# Patient Record
Sex: Female | Born: 1955 | Race: White | Hispanic: No | Marital: Married | Smoking: Never smoker
Health system: Southern US, Community
[De-identification: ages and names within clinical notes are randomized; demographics above are authoritative.]

## PROBLEM LIST (undated history)

## (undated) DIAGNOSIS — I5031 Acute diastolic (congestive) heart failure: Secondary | ICD-10-CM

## (undated) DIAGNOSIS — I10 Essential (primary) hypertension: Secondary | ICD-10-CM

## (undated) DIAGNOSIS — I2510 Atherosclerotic heart disease of native coronary artery without angina pectoris: Secondary | ICD-10-CM

## (undated) DIAGNOSIS — E039 Hypothyroidism, unspecified: Secondary | ICD-10-CM

## (undated) DIAGNOSIS — I4891 Unspecified atrial fibrillation: Secondary | ICD-10-CM

## (undated) DIAGNOSIS — R079 Chest pain, unspecified: Secondary | ICD-10-CM

## (undated) HISTORY — PX: MEDIAL PARTIAL KNEE REPLACEMENT: SHX5965

## (undated) HISTORY — PX: CHOLECYSTECTOMY: SHX55

## (undated) HISTORY — DX: Acute diastolic (congestive) heart failure: I50.31

## (undated) HISTORY — DX: Unspecified atrial fibrillation: I48.91

## (undated) HISTORY — DX: Atherosclerotic heart disease of native coronary artery without angina pectoris: I25.10

## (undated) HISTORY — DX: Chest pain, unspecified: R07.9

## (undated) HISTORY — DX: Essential (primary) hypertension: I10

---

## 2002-10-31 ENCOUNTER — Observation Stay (HOSPITAL_COMMUNITY): Admission: RE | Admit: 2002-10-31 | Discharge: 2002-11-01 | Payer: Self-pay | Admitting: Orthopedic Surgery

## 2004-06-26 ENCOUNTER — Ambulatory Visit (HOSPITAL_COMMUNITY): Admission: RE | Admit: 2004-06-26 | Discharge: 2004-06-27 | Payer: Self-pay | Admitting: Orthopedic Surgery

## 2012-09-06 DIAGNOSIS — R259 Unspecified abnormal involuntary movements: Secondary | ICD-10-CM | POA: Diagnosis not present

## 2012-09-06 DIAGNOSIS — R5383 Other fatigue: Secondary | ICD-10-CM | POA: Diagnosis not present

## 2012-09-06 DIAGNOSIS — I10 Essential (primary) hypertension: Secondary | ICD-10-CM | POA: Diagnosis not present

## 2012-09-06 DIAGNOSIS — A499 Bacterial infection, unspecified: Secondary | ICD-10-CM | POA: Diagnosis not present

## 2012-09-06 DIAGNOSIS — E119 Type 2 diabetes mellitus without complications: Secondary | ICD-10-CM | POA: Diagnosis not present

## 2012-09-06 DIAGNOSIS — R5381 Other malaise: Secondary | ICD-10-CM | POA: Diagnosis not present

## 2012-09-06 DIAGNOSIS — R51 Headache: Secondary | ICD-10-CM | POA: Diagnosis not present

## 2012-09-06 DIAGNOSIS — N390 Urinary tract infection, site not specified: Secondary | ICD-10-CM | POA: Diagnosis not present

## 2012-09-06 DIAGNOSIS — R209 Unspecified disturbances of skin sensation: Secondary | ICD-10-CM | POA: Diagnosis not present

## 2012-09-06 DIAGNOSIS — Z79899 Other long term (current) drug therapy: Secondary | ICD-10-CM | POA: Diagnosis not present

## 2013-07-06 DIAGNOSIS — Z6841 Body Mass Index (BMI) 40.0 and over, adult: Secondary | ICD-10-CM | POA: Diagnosis not present

## 2013-07-06 DIAGNOSIS — L408 Other psoriasis: Secondary | ICD-10-CM | POA: Diagnosis not present

## 2013-07-06 DIAGNOSIS — B369 Superficial mycosis, unspecified: Secondary | ICD-10-CM | POA: Diagnosis not present

## 2013-08-14 DIAGNOSIS — L219 Seborrheic dermatitis, unspecified: Secondary | ICD-10-CM | POA: Diagnosis not present

## 2013-08-14 DIAGNOSIS — L538 Other specified erythematous conditions: Secondary | ICD-10-CM | POA: Diagnosis not present

## 2013-08-15 DIAGNOSIS — F411 Generalized anxiety disorder: Secondary | ICD-10-CM | POA: Diagnosis not present

## 2013-08-15 DIAGNOSIS — Z6841 Body Mass Index (BMI) 40.0 and over, adult: Secondary | ICD-10-CM | POA: Diagnosis not present

## 2013-08-15 DIAGNOSIS — Z79899 Other long term (current) drug therapy: Secondary | ICD-10-CM | POA: Diagnosis not present

## 2013-08-29 DIAGNOSIS — F411 Generalized anxiety disorder: Secondary | ICD-10-CM | POA: Diagnosis not present

## 2013-08-29 DIAGNOSIS — I10 Essential (primary) hypertension: Secondary | ICD-10-CM | POA: Diagnosis not present

## 2013-08-29 DIAGNOSIS — Z79899 Other long term (current) drug therapy: Secondary | ICD-10-CM | POA: Diagnosis not present

## 2013-09-19 DIAGNOSIS — D485 Neoplasm of uncertain behavior of skin: Secondary | ICD-10-CM | POA: Diagnosis not present

## 2013-09-19 DIAGNOSIS — L259 Unspecified contact dermatitis, unspecified cause: Secondary | ICD-10-CM | POA: Diagnosis not present

## 2013-09-19 DIAGNOSIS — L219 Seborrheic dermatitis, unspecified: Secondary | ICD-10-CM | POA: Diagnosis not present

## 2013-09-26 DIAGNOSIS — L408 Other psoriasis: Secondary | ICD-10-CM | POA: Diagnosis not present

## 2014-03-03 DIAGNOSIS — Z23 Encounter for immunization: Secondary | ICD-10-CM | POA: Diagnosis not present

## 2014-05-15 DIAGNOSIS — L304 Erythema intertrigo: Secondary | ICD-10-CM | POA: Diagnosis not present

## 2014-05-15 DIAGNOSIS — L820 Inflamed seborrheic keratosis: Secondary | ICD-10-CM | POA: Diagnosis not present

## 2014-05-15 DIAGNOSIS — L400 Psoriasis vulgaris: Secondary | ICD-10-CM | POA: Diagnosis not present

## 2014-06-19 DIAGNOSIS — L304 Erythema intertrigo: Secondary | ICD-10-CM | POA: Diagnosis not present

## 2015-01-29 DIAGNOSIS — L400 Psoriasis vulgaris: Secondary | ICD-10-CM | POA: Diagnosis not present

## 2015-04-19 DIAGNOSIS — E1165 Type 2 diabetes mellitus with hyperglycemia: Secondary | ICD-10-CM | POA: Diagnosis not present

## 2015-04-19 DIAGNOSIS — I10 Essential (primary) hypertension: Secondary | ICD-10-CM | POA: Diagnosis not present

## 2015-04-19 DIAGNOSIS — Z6838 Body mass index (BMI) 38.0-38.9, adult: Secondary | ICD-10-CM | POA: Diagnosis not present

## 2015-04-19 DIAGNOSIS — E785 Hyperlipidemia, unspecified: Secondary | ICD-10-CM | POA: Diagnosis not present

## 2015-04-19 DIAGNOSIS — Z1231 Encounter for screening mammogram for malignant neoplasm of breast: Secondary | ICD-10-CM | POA: Diagnosis not present

## 2015-04-19 DIAGNOSIS — E669 Obesity, unspecified: Secondary | ICD-10-CM | POA: Diagnosis not present

## 2015-04-19 DIAGNOSIS — Z0000 Encounter for general adult medical examination without abnormal findings: Secondary | ICD-10-CM | POA: Diagnosis not present

## 2015-04-19 DIAGNOSIS — Z1389 Encounter for screening for other disorder: Secondary | ICD-10-CM | POA: Diagnosis not present

## 2015-04-19 DIAGNOSIS — Z23 Encounter for immunization: Secondary | ICD-10-CM | POA: Diagnosis not present

## 2015-04-26 DIAGNOSIS — Z1212 Encounter for screening for malignant neoplasm of rectum: Secondary | ICD-10-CM | POA: Diagnosis not present

## 2015-05-02 DIAGNOSIS — Z1231 Encounter for screening mammogram for malignant neoplasm of breast: Secondary | ICD-10-CM | POA: Diagnosis not present

## 2015-05-17 DIAGNOSIS — Z6838 Body mass index (BMI) 38.0-38.9, adult: Secondary | ICD-10-CM | POA: Diagnosis not present

## 2015-05-17 DIAGNOSIS — I10 Essential (primary) hypertension: Secondary | ICD-10-CM | POA: Diagnosis not present

## 2015-05-17 DIAGNOSIS — E1165 Type 2 diabetes mellitus with hyperglycemia: Secondary | ICD-10-CM | POA: Diagnosis not present

## 2015-05-17 DIAGNOSIS — E785 Hyperlipidemia, unspecified: Secondary | ICD-10-CM | POA: Diagnosis not present

## 2015-06-13 DIAGNOSIS — Z6839 Body mass index (BMI) 39.0-39.9, adult: Secondary | ICD-10-CM | POA: Diagnosis not present

## 2015-06-13 DIAGNOSIS — E1165 Type 2 diabetes mellitus with hyperglycemia: Secondary | ICD-10-CM | POA: Diagnosis not present

## 2015-06-13 DIAGNOSIS — I10 Essential (primary) hypertension: Secondary | ICD-10-CM | POA: Diagnosis not present

## 2015-07-11 DIAGNOSIS — E785 Hyperlipidemia, unspecified: Secondary | ICD-10-CM | POA: Diagnosis not present

## 2015-07-11 DIAGNOSIS — I10 Essential (primary) hypertension: Secondary | ICD-10-CM | POA: Diagnosis not present

## 2015-07-11 DIAGNOSIS — Z6841 Body Mass Index (BMI) 40.0 and over, adult: Secondary | ICD-10-CM | POA: Diagnosis not present

## 2015-07-11 DIAGNOSIS — E1165 Type 2 diabetes mellitus with hyperglycemia: Secondary | ICD-10-CM | POA: Diagnosis not present

## 2015-07-29 DIAGNOSIS — D485 Neoplasm of uncertain behavior of skin: Secondary | ICD-10-CM | POA: Diagnosis not present

## 2015-07-29 DIAGNOSIS — L400 Psoriasis vulgaris: Secondary | ICD-10-CM | POA: Diagnosis not present

## 2015-09-13 DIAGNOSIS — R079 Chest pain, unspecified: Secondary | ICD-10-CM | POA: Diagnosis not present

## 2015-09-13 DIAGNOSIS — M79642 Pain in left hand: Secondary | ICD-10-CM | POA: Diagnosis not present

## 2015-09-13 DIAGNOSIS — M79645 Pain in left finger(s): Secondary | ICD-10-CM | POA: Diagnosis not present

## 2015-09-13 DIAGNOSIS — R0789 Other chest pain: Secondary | ICD-10-CM | POA: Diagnosis not present

## 2015-09-16 DIAGNOSIS — Z6841 Body Mass Index (BMI) 40.0 and over, adult: Secondary | ICD-10-CM | POA: Diagnosis not present

## 2015-09-16 DIAGNOSIS — S161XXA Strain of muscle, fascia and tendon at neck level, initial encounter: Secondary | ICD-10-CM | POA: Diagnosis not present

## 2015-09-16 DIAGNOSIS — M542 Cervicalgia: Secondary | ICD-10-CM | POA: Diagnosis not present

## 2015-09-16 DIAGNOSIS — R0789 Other chest pain: Secondary | ICD-10-CM | POA: Diagnosis not present

## 2015-09-16 DIAGNOSIS — M79645 Pain in left finger(s): Secondary | ICD-10-CM | POA: Diagnosis not present

## 2015-10-04 DIAGNOSIS — Z6841 Body Mass Index (BMI) 40.0 and over, adult: Secondary | ICD-10-CM | POA: Diagnosis not present

## 2015-10-04 DIAGNOSIS — N644 Mastodynia: Secondary | ICD-10-CM | POA: Diagnosis not present

## 2015-10-10 DIAGNOSIS — N6001 Solitary cyst of right breast: Secondary | ICD-10-CM | POA: Diagnosis not present

## 2015-10-10 DIAGNOSIS — N63 Unspecified lump in breast: Secondary | ICD-10-CM | POA: Diagnosis not present

## 2015-10-10 DIAGNOSIS — N644 Mastodynia: Secondary | ICD-10-CM | POA: Diagnosis not present

## 2015-10-10 DIAGNOSIS — N641 Fat necrosis of breast: Secondary | ICD-10-CM | POA: Diagnosis not present

## 2015-10-15 DIAGNOSIS — E785 Hyperlipidemia, unspecified: Secondary | ICD-10-CM | POA: Diagnosis not present

## 2015-10-15 DIAGNOSIS — I10 Essential (primary) hypertension: Secondary | ICD-10-CM | POA: Diagnosis not present

## 2015-10-15 DIAGNOSIS — E1165 Type 2 diabetes mellitus with hyperglycemia: Secondary | ICD-10-CM | POA: Diagnosis not present

## 2015-11-12 DIAGNOSIS — R600 Localized edema: Secondary | ICD-10-CM | POA: Diagnosis not present

## 2015-11-22 DIAGNOSIS — L4050 Arthropathic psoriasis, unspecified: Secondary | ICD-10-CM | POA: Diagnosis not present

## 2015-11-22 DIAGNOSIS — L409 Psoriasis, unspecified: Secondary | ICD-10-CM | POA: Diagnosis not present

## 2015-11-22 DIAGNOSIS — L400 Psoriasis vulgaris: Secondary | ICD-10-CM | POA: Diagnosis not present

## 2015-11-27 DIAGNOSIS — R0600 Dyspnea, unspecified: Secondary | ICD-10-CM | POA: Diagnosis not present

## 2015-11-27 DIAGNOSIS — R0609 Other forms of dyspnea: Secondary | ICD-10-CM | POA: Diagnosis not present

## 2015-11-27 DIAGNOSIS — R600 Localized edema: Secondary | ICD-10-CM | POA: Diagnosis not present

## 2015-11-27 DIAGNOSIS — Z6841 Body Mass Index (BMI) 40.0 and over, adult: Secondary | ICD-10-CM | POA: Diagnosis not present

## 2015-12-18 DIAGNOSIS — R0609 Other forms of dyspnea: Secondary | ICD-10-CM | POA: Diagnosis not present

## 2015-12-18 DIAGNOSIS — I5042 Chronic combined systolic (congestive) and diastolic (congestive) heart failure: Secondary | ICD-10-CM | POA: Diagnosis not present

## 2015-12-18 DIAGNOSIS — E119 Type 2 diabetes mellitus without complications: Secondary | ICD-10-CM | POA: Insufficient documentation

## 2015-12-18 DIAGNOSIS — R0600 Dyspnea, unspecified: Secondary | ICD-10-CM

## 2015-12-18 DIAGNOSIS — I350 Nonrheumatic aortic (valve) stenosis: Secondary | ICD-10-CM | POA: Diagnosis not present

## 2015-12-18 HISTORY — DX: Type 2 diabetes mellitus without complications: E11.9

## 2015-12-18 HISTORY — DX: Dyspnea, unspecified: R06.00

## 2016-01-02 DIAGNOSIS — Z79899 Other long term (current) drug therapy: Secondary | ICD-10-CM | POA: Diagnosis not present

## 2016-01-06 DIAGNOSIS — R0609 Other forms of dyspnea: Secondary | ICD-10-CM | POA: Diagnosis not present

## 2016-01-13 DIAGNOSIS — L400 Psoriasis vulgaris: Secondary | ICD-10-CM | POA: Diagnosis not present

## 2016-01-17 DIAGNOSIS — L400 Psoriasis vulgaris: Secondary | ICD-10-CM | POA: Diagnosis not present

## 2016-01-17 DIAGNOSIS — R0609 Other forms of dyspnea: Secondary | ICD-10-CM | POA: Diagnosis not present

## 2016-01-17 DIAGNOSIS — I350 Nonrheumatic aortic (valve) stenosis: Secondary | ICD-10-CM | POA: Diagnosis not present

## 2016-01-17 DIAGNOSIS — E119 Type 2 diabetes mellitus without complications: Secondary | ICD-10-CM | POA: Diagnosis not present

## 2016-01-17 DIAGNOSIS — I5042 Chronic combined systolic (congestive) and diastolic (congestive) heart failure: Secondary | ICD-10-CM | POA: Diagnosis not present

## 2016-01-22 DIAGNOSIS — I10 Essential (primary) hypertension: Secondary | ICD-10-CM | POA: Diagnosis not present

## 2016-01-22 DIAGNOSIS — E785 Hyperlipidemia, unspecified: Secondary | ICD-10-CM | POA: Diagnosis not present

## 2016-01-22 DIAGNOSIS — R600 Localized edema: Secondary | ICD-10-CM | POA: Diagnosis not present

## 2016-01-22 DIAGNOSIS — Z6841 Body Mass Index (BMI) 40.0 and over, adult: Secondary | ICD-10-CM | POA: Diagnosis not present

## 2016-01-22 DIAGNOSIS — E1165 Type 2 diabetes mellitus with hyperglycemia: Secondary | ICD-10-CM | POA: Diagnosis not present

## 2016-02-14 DIAGNOSIS — L400 Psoriasis vulgaris: Secondary | ICD-10-CM | POA: Diagnosis not present

## 2016-03-26 DIAGNOSIS — R531 Weakness: Secondary | ICD-10-CM | POA: Diagnosis not present

## 2016-03-26 DIAGNOSIS — L400 Psoriasis vulgaris: Secondary | ICD-10-CM | POA: Diagnosis not present

## 2016-03-26 DIAGNOSIS — L4050 Arthropathic psoriasis, unspecified: Secondary | ICD-10-CM | POA: Diagnosis not present

## 2016-04-27 DIAGNOSIS — Z1389 Encounter for screening for other disorder: Secondary | ICD-10-CM | POA: Diagnosis not present

## 2016-04-27 DIAGNOSIS — I10 Essential (primary) hypertension: Secondary | ICD-10-CM | POA: Diagnosis not present

## 2016-04-27 DIAGNOSIS — Z1231 Encounter for screening mammogram for malignant neoplasm of breast: Secondary | ICD-10-CM | POA: Diagnosis not present

## 2016-04-27 DIAGNOSIS — I519 Heart disease, unspecified: Secondary | ICD-10-CM | POA: Diagnosis not present

## 2016-04-27 DIAGNOSIS — Z6841 Body Mass Index (BMI) 40.0 and over, adult: Secondary | ICD-10-CM | POA: Diagnosis not present

## 2016-04-27 DIAGNOSIS — M8589 Other specified disorders of bone density and structure, multiple sites: Secondary | ICD-10-CM | POA: Diagnosis not present

## 2016-04-27 DIAGNOSIS — Z23 Encounter for immunization: Secondary | ICD-10-CM | POA: Diagnosis not present

## 2016-04-27 DIAGNOSIS — E785 Hyperlipidemia, unspecified: Secondary | ICD-10-CM | POA: Diagnosis not present

## 2016-04-27 DIAGNOSIS — E1165 Type 2 diabetes mellitus with hyperglycemia: Secondary | ICD-10-CM | POA: Diagnosis not present

## 2016-04-27 DIAGNOSIS — R600 Localized edema: Secondary | ICD-10-CM | POA: Diagnosis not present

## 2016-05-01 DIAGNOSIS — L400 Psoriasis vulgaris: Secondary | ICD-10-CM | POA: Diagnosis not present

## 2016-05-01 DIAGNOSIS — R531 Weakness: Secondary | ICD-10-CM | POA: Diagnosis not present

## 2016-05-15 DIAGNOSIS — M85851 Other specified disorders of bone density and structure, right thigh: Secondary | ICD-10-CM | POA: Diagnosis not present

## 2016-05-15 DIAGNOSIS — Z1231 Encounter for screening mammogram for malignant neoplasm of breast: Secondary | ICD-10-CM | POA: Diagnosis not present

## 2016-05-15 DIAGNOSIS — M8589 Other specified disorders of bone density and structure, multiple sites: Secondary | ICD-10-CM | POA: Diagnosis not present

## 2016-05-28 DIAGNOSIS — L4050 Arthropathic psoriasis, unspecified: Secondary | ICD-10-CM | POA: Diagnosis not present

## 2016-05-28 DIAGNOSIS — L400 Psoriasis vulgaris: Secondary | ICD-10-CM | POA: Diagnosis not present

## 2016-06-11 DIAGNOSIS — R531 Weakness: Secondary | ICD-10-CM | POA: Diagnosis not present

## 2016-06-11 DIAGNOSIS — L400 Psoriasis vulgaris: Secondary | ICD-10-CM | POA: Diagnosis not present

## 2016-06-22 DIAGNOSIS — L400 Psoriasis vulgaris: Secondary | ICD-10-CM | POA: Diagnosis not present

## 2016-07-29 DIAGNOSIS — E1165 Type 2 diabetes mellitus with hyperglycemia: Secondary | ICD-10-CM | POA: Diagnosis not present

## 2016-07-29 DIAGNOSIS — E785 Hyperlipidemia, unspecified: Secondary | ICD-10-CM | POA: Diagnosis not present

## 2016-07-29 DIAGNOSIS — Z6841 Body Mass Index (BMI) 40.0 and over, adult: Secondary | ICD-10-CM | POA: Diagnosis not present

## 2016-07-29 DIAGNOSIS — I10 Essential (primary) hypertension: Secondary | ICD-10-CM | POA: Diagnosis not present

## 2016-07-29 DIAGNOSIS — R600 Localized edema: Secondary | ICD-10-CM | POA: Diagnosis not present

## 2016-09-21 DIAGNOSIS — L4050 Arthropathic psoriasis, unspecified: Secondary | ICD-10-CM | POA: Diagnosis not present

## 2016-09-21 DIAGNOSIS — L400 Psoriasis vulgaris: Secondary | ICD-10-CM | POA: Diagnosis not present

## 2016-11-02 DIAGNOSIS — E785 Hyperlipidemia, unspecified: Secondary | ICD-10-CM | POA: Diagnosis not present

## 2016-11-02 DIAGNOSIS — E1165 Type 2 diabetes mellitus with hyperglycemia: Secondary | ICD-10-CM | POA: Diagnosis not present

## 2016-11-02 DIAGNOSIS — R600 Localized edema: Secondary | ICD-10-CM | POA: Diagnosis not present

## 2016-11-02 DIAGNOSIS — I10 Essential (primary) hypertension: Secondary | ICD-10-CM | POA: Diagnosis not present

## 2016-11-02 DIAGNOSIS — Z6841 Body Mass Index (BMI) 40.0 and over, adult: Secondary | ICD-10-CM | POA: Diagnosis not present

## 2016-12-07 DIAGNOSIS — Z79899 Other long term (current) drug therapy: Secondary | ICD-10-CM | POA: Diagnosis not present

## 2016-12-07 DIAGNOSIS — I10 Essential (primary) hypertension: Secondary | ICD-10-CM | POA: Diagnosis not present

## 2016-12-07 DIAGNOSIS — E119 Type 2 diabetes mellitus without complications: Secondary | ICD-10-CM | POA: Diagnosis not present

## 2016-12-07 DIAGNOSIS — J810 Acute pulmonary edema: Secondary | ICD-10-CM | POA: Diagnosis not present

## 2016-12-07 DIAGNOSIS — I200 Unstable angina: Secondary | ICD-10-CM | POA: Diagnosis not present

## 2016-12-07 DIAGNOSIS — R0602 Shortness of breath: Secondary | ICD-10-CM | POA: Diagnosis not present

## 2016-12-07 DIAGNOSIS — R079 Chest pain, unspecified: Secondary | ICD-10-CM | POA: Diagnosis not present

## 2016-12-07 DIAGNOSIS — Z7984 Long term (current) use of oral hypoglycemic drugs: Secondary | ICD-10-CM | POA: Diagnosis not present

## 2016-12-08 DIAGNOSIS — E119 Type 2 diabetes mellitus without complications: Secondary | ICD-10-CM | POA: Diagnosis not present

## 2016-12-08 DIAGNOSIS — R079 Chest pain, unspecified: Secondary | ICD-10-CM | POA: Diagnosis not present

## 2016-12-08 DIAGNOSIS — I10 Essential (primary) hypertension: Secondary | ICD-10-CM | POA: Diagnosis not present

## 2016-12-09 DIAGNOSIS — Z6841 Body Mass Index (BMI) 40.0 and over, adult: Secondary | ICD-10-CM | POA: Diagnosis not present

## 2016-12-09 DIAGNOSIS — F419 Anxiety disorder, unspecified: Secondary | ICD-10-CM | POA: Diagnosis not present

## 2016-12-09 DIAGNOSIS — E1165 Type 2 diabetes mellitus with hyperglycemia: Secondary | ICD-10-CM | POA: Diagnosis not present

## 2016-12-09 DIAGNOSIS — E785 Hyperlipidemia, unspecified: Secondary | ICD-10-CM | POA: Diagnosis not present

## 2016-12-09 DIAGNOSIS — Z79899 Other long term (current) drug therapy: Secondary | ICD-10-CM | POA: Diagnosis not present

## 2016-12-09 DIAGNOSIS — R079 Chest pain, unspecified: Secondary | ICD-10-CM | POA: Diagnosis not present

## 2016-12-09 DIAGNOSIS — I10 Essential (primary) hypertension: Secondary | ICD-10-CM | POA: Diagnosis not present

## 2016-12-22 DIAGNOSIS — L400 Psoriasis vulgaris: Secondary | ICD-10-CM | POA: Diagnosis not present

## 2016-12-22 DIAGNOSIS — L4050 Arthropathic psoriasis, unspecified: Secondary | ICD-10-CM | POA: Diagnosis not present

## 2017-02-08 DIAGNOSIS — R0609 Other forms of dyspnea: Secondary | ICD-10-CM | POA: Diagnosis not present

## 2017-02-08 DIAGNOSIS — E7800 Pure hypercholesterolemia, unspecified: Secondary | ICD-10-CM | POA: Diagnosis not present

## 2017-02-08 DIAGNOSIS — I248 Other forms of acute ischemic heart disease: Secondary | ICD-10-CM | POA: Diagnosis present

## 2017-02-08 DIAGNOSIS — J9601 Acute respiratory failure with hypoxia: Secondary | ICD-10-CM | POA: Diagnosis present

## 2017-02-08 DIAGNOSIS — R0789 Other chest pain: Secondary | ICD-10-CM | POA: Diagnosis not present

## 2017-02-08 DIAGNOSIS — R000 Tachycardia, unspecified: Secondary | ICD-10-CM | POA: Diagnosis not present

## 2017-02-08 DIAGNOSIS — I10 Essential (primary) hypertension: Secondary | ICD-10-CM | POA: Diagnosis not present

## 2017-02-08 DIAGNOSIS — Z23 Encounter for immunization: Secondary | ICD-10-CM | POA: Diagnosis not present

## 2017-02-08 DIAGNOSIS — R079 Chest pain, unspecified: Secondary | ICD-10-CM | POA: Diagnosis not present

## 2017-02-08 DIAGNOSIS — I5031 Acute diastolic (congestive) heart failure: Secondary | ICD-10-CM | POA: Diagnosis present

## 2017-02-08 DIAGNOSIS — I480 Paroxysmal atrial fibrillation: Secondary | ICD-10-CM | POA: Diagnosis not present

## 2017-02-08 DIAGNOSIS — I110 Hypertensive heart disease with heart failure: Secondary | ICD-10-CM | POA: Diagnosis present

## 2017-02-08 DIAGNOSIS — R0602 Shortness of breath: Secondary | ICD-10-CM | POA: Diagnosis not present

## 2017-02-08 DIAGNOSIS — E119 Type 2 diabetes mellitus without complications: Secondary | ICD-10-CM | POA: Diagnosis not present

## 2017-02-08 DIAGNOSIS — Z7984 Long term (current) use of oral hypoglycemic drugs: Secondary | ICD-10-CM | POA: Diagnosis not present

## 2017-02-08 DIAGNOSIS — I4891 Unspecified atrial fibrillation: Secondary | ICD-10-CM | POA: Diagnosis not present

## 2017-02-08 DIAGNOSIS — Z6841 Body Mass Index (BMI) 40.0 and over, adult: Secondary | ICD-10-CM | POA: Diagnosis not present

## 2017-02-08 DIAGNOSIS — E785 Hyperlipidemia, unspecified: Secondary | ICD-10-CM | POA: Diagnosis not present

## 2017-02-08 DIAGNOSIS — M545 Low back pain: Secondary | ICD-10-CM | POA: Diagnosis not present

## 2017-02-08 DIAGNOSIS — Z79899 Other long term (current) drug therapy: Secondary | ICD-10-CM | POA: Diagnosis not present

## 2017-02-09 DIAGNOSIS — R079 Chest pain, unspecified: Secondary | ICD-10-CM

## 2017-02-09 DIAGNOSIS — R0602 Shortness of breath: Secondary | ICD-10-CM

## 2017-02-09 DIAGNOSIS — I10 Essential (primary) hypertension: Secondary | ICD-10-CM

## 2017-02-09 DIAGNOSIS — I4891 Unspecified atrial fibrillation: Secondary | ICD-10-CM

## 2017-02-10 DIAGNOSIS — I4891 Unspecified atrial fibrillation: Secondary | ICD-10-CM | POA: Diagnosis not present

## 2017-02-11 DIAGNOSIS — I4891 Unspecified atrial fibrillation: Secondary | ICD-10-CM

## 2017-02-17 DIAGNOSIS — Z6841 Body Mass Index (BMI) 40.0 and over, adult: Secondary | ICD-10-CM | POA: Diagnosis not present

## 2017-02-17 DIAGNOSIS — I480 Paroxysmal atrial fibrillation: Secondary | ICD-10-CM | POA: Diagnosis not present

## 2017-02-17 DIAGNOSIS — I5031 Acute diastolic (congestive) heart failure: Secondary | ICD-10-CM | POA: Diagnosis not present

## 2017-02-17 DIAGNOSIS — Z79899 Other long term (current) drug therapy: Secondary | ICD-10-CM | POA: Diagnosis not present

## 2017-02-25 ENCOUNTER — Ambulatory Visit (INDEPENDENT_AMBULATORY_CARE_PROVIDER_SITE_OTHER): Payer: Medicare Other | Admitting: Cardiology

## 2017-02-25 ENCOUNTER — Encounter: Payer: Self-pay | Admitting: Cardiology

## 2017-02-25 VITALS — BP 98/60 | HR 114 | Ht 62.0 in | Wt 213.1 lb

## 2017-02-25 DIAGNOSIS — E785 Hyperlipidemia, unspecified: Secondary | ICD-10-CM | POA: Diagnosis not present

## 2017-02-25 DIAGNOSIS — Z794 Long term (current) use of insulin: Secondary | ICD-10-CM | POA: Diagnosis not present

## 2017-02-25 DIAGNOSIS — E119 Type 2 diabetes mellitus without complications: Secondary | ICD-10-CM

## 2017-02-25 DIAGNOSIS — R002 Palpitations: Secondary | ICD-10-CM | POA: Diagnosis not present

## 2017-02-25 DIAGNOSIS — I480 Paroxysmal atrial fibrillation: Secondary | ICD-10-CM | POA: Insufficient documentation

## 2017-02-25 DIAGNOSIS — I10 Essential (primary) hypertension: Secondary | ICD-10-CM

## 2017-02-25 DIAGNOSIS — I7090 Unspecified atherosclerosis: Secondary | ICD-10-CM

## 2017-02-25 HISTORY — DX: Unspecified atherosclerosis: I70.90

## 2017-02-25 HISTORY — DX: Hyperlipidemia, unspecified: E78.5

## 2017-02-25 HISTORY — DX: Paroxysmal atrial fibrillation: I48.0

## 2017-02-25 HISTORY — DX: Morbid (severe) obesity due to excess calories: E66.01

## 2017-02-25 MED ORDER — AMIODARONE HCL 200 MG PO TABS: 400.0000 mg | ORAL_TABLET | Freq: Two times a day (BID) | ORAL | 3 refills | Status: DC

## 2017-02-25 NOTE — Patient Instructions (Addendum)
Medication Instructions:  Your physician has recommended you make the following change in your medication: START amiodarone 400mg  twice a day for one week and then take 400mg  daily Xarelto samples given  Labwork: None  Testing/Procedures: Your physician has recommended that you have a pulmonary function test. Pulmonary Function Tests are a group of tests that measure how well air moves in and out of your lungs.  This can be done with Dr. Alcide Clever  Follow-Up: 2-3 weeks  Any Other Special Instructions Will Be Listed Below (If Applicable).   Pulmonary Function Tests Pulmonary function tests (PFTs) are used to measure how well your lungs work, find out what is causing your lung problems, and figure out the best treatment for you. You may have PFTs:  When you have an illness involving the lungs.  To follow changes in your lung function over time if you have a chronic lung disease.  If you are an Nature conservation officer. This checks the effects of being exposed to chemicals over a long period of time.  To check lung function before having surgery or other procedures.  To check your lungs if you smoke.  To check if prescribed medicines or treatments are helping your lungs.  Your results will be compared to the expected lung function of someone with healthy lungs who is similar to you in:  Age.  Gender.  Height.  Weight.  Race or ethnicity.  This is done to show how your lungs compare to normal lung function (percent predicted). This is how your health care provider knows if your lung function is normal or not. If you have had PFTs done before, your health care provider will compare your current results with past results. This shows if your lung function is better, worse, or the same as before. Tell a health care provider about:  Any allergies you have.  All medicines you are taking, including inhaler or nebulizer medicines, vitamins, herbs, eye drops, creams, and  over-the-counter medicines.  Any blood disorders you have.  Any surgeries you have had, especially recent eye surgery, abdominal surgery, or chest surgery. These can make PFTs difficult or unsafe.  Any medical conditions you have, including chest pain or heart problems, tuberculosis, or respiratory infections such as pneumonia, a cold, or the flu.  Any fear of being in closed spaces (claustrophobia). Some of your tests may be in a closed space. What are the risks? Generally, this is a safe procedure. However, problems may occur, including:  Light-headedness due to over-breathing (hyperventilation).  An asthma attack from deep breathing.  A collapsed lung.  What happens before the procedure?  Take over-the-counter and prescription medicines only as told by your health care provider. If you take inhaler or nebulizer medicines, ask your health care provider which medicines you should take on the day of your testing. Some inhaler medicines may interfere with PFTs if they are taken shortly before the tests.  Follow your health care provider's instructions on eating and drinking restrictions. This may include avoiding eating large meals and drinking alcohol before the testing.  Do not use any products that contain nicotine or tobacco, such as cigarettes and e-cigarettes. If you need help quitting, ask your health care provider.  Wear comfortable clothing that will not interfere with breathing. What happens during the procedure?  You will be given a soft nose clip to wear. This is done so all of your breaths will go through your mouth instead of your nose.  You will be given a  germ-free (sterile) mouthpiece. It will be attached to a machine that measures your breathing (spirometer).  You will be asked to do various breathing maneuvers. The maneuvers will be done by breathing in (inhaling) and breathing out (exhaling). You may be asked to repeat the maneuvers several times before the testing  is done.  It is important to follow the instructions exactly to get accurate results. Make sure to blow as hard and as fast as you can when you are told to do so.  You may be given a medicine that makes the small air passages in your lungs larger (bronchodilator) after testing has been done. This medicine will make it easier for you to breathe.  The tests will be repeated after the bronchodilator has taken effect.  You will be monitored carefully during the procedure for faintness, dizziness, trouble breathing, or any other problems. The procedure may vary among health care providers and hospitals. What happens after the procedure?  It is up to you to get your test results. Ask your health care provider, or the department that is doing the tests, when your results will be ready. After you have received your test results, talk with your health care provider about treatment options, if necessary. Summary  Pulmonary function tests (PFTs) are used to measure how well your lungs work, find out what is causing your lung problems, and figure out the best treatment for you.  Wear comfortable clothing that will not interfere with breathing.  It is up to you to get your test results. After you have received them, talk with your health care provider about treatment options, if necessary. This information is not intended to replace advice given to you by your health care provider. Make sure you discuss any questions you have with your health care provider. Document Released: 12/26/2003 Document Revised: 03/26/2016 Document Reviewed: 03/26/2016 Elsevier Interactive Patient Education  2017 Else[employer] Amiodarone tablets What is this medicine? AMIODARONE (a MEE oh da rone) is an antiarrhythmic drug. It helps make your heart beat regularly. Because of the side effects caused by this medicine, it is only used when other medicines have not worked. It is usually used for heartbeat problems that may be life  threatening. This medicine may be used for other purposes; ask your health care provider or pharmacist if you have questions. COMMON BRAND NAME(S): Cordarone, Pacerone What should I tell my health care provider before I take this medicine? They need to know if you have any of these conditions: -liver disease -lung disease -other heart problems -thyroid disease -an unusual or allergic reaction to amiodarone, iodine, other medicines, foods, dyes, or preservatives -pregnant or trying to get pregnant -breast-feeding How should I use this medicine? Take this medicine by mouth with a glass of water. Follow the directions on the prescription label. You can take this medicine with or without food. However, you should always take it the same way each time. Take your doses at regular intervals. Do not take your medicine more often than directed. Do not stop taking except on the advice of your doctor or health care professional. A special MedGuide will be given to you by the pharmacist with each prescription and refill. Be sure to read this information carefully each time. Talk to your pediatrician regarding the use of this medicine in children. Special care may be needed. Overdosage: If you think you have taken too much of this medicine contact a poison control center or emergency room at once. NOTE: This medicine is  only for you. Do not share this medicine with others. What if I miss a dose? If you miss a dose, take it as soon as you can. If it is almost time for your next dose, take only that dose. Do not take double or extra doses. What may interact with this medicine? Do not take this medicine with any of the following medications: -abarelix -apomorphine -arsenic trioxide -certain antibiotics like erythromycin, gemifloxacin, levofloxacin, pentamidine -certain medicines for depression like amoxapine, tricyclic antidepressants -certain medicines for fungal infections like fluconazole,  itraconazole, ketoconazole, posaconazole, voriconazole -certain medicines for irregular heart beat like disopyramide, dofetilide, dronedarone, ibutilide, propafenone, sotalol -certain medicines for malaria like chloroquine, halofantrine -cisapride -droperidol -haloperidol -hawthorn -maprotiline -methadone -phenothiazines like chlorpromazine, mesoridazine, thioridazine -pimozide -ranolazine -red yeast rice -vardenafil -ziprasidone This medicine may also interact with the following medications: -antiviral medicines for HIV or AIDS -certain medicines for blood pressure, heart disease, irregular heart beat -certain medicines for cholesterol like atorvastatin, cerivastatin, lovastatin, simvastatin -certain medicines for hepatitis C like sofosbuvir and ledipasvir; sofosbuvir -certain medicines for seizures like phenytoin -certain medicines for thyroid problems -certain medicines that treat or prevent blood clots like warfarin -cholestyramine -cimetidine -clopidogrel -cyclosporine -dextromethorphan -diuretics -fentanyl -general anesthetics -grapefruit juice -lidocaine -loratadine -methotrexate -other medicines that prolong the QT interval (cause an abnormal heart rhythm) -procainamide -quinidine -rifabutin, rifampin, or rifapentine -St. John's Wort -trazodone This list may not describe all possible interactions. Give your health care provider a list of all the medicines, herbs, non-prescription drugs, or dietary supplements you use. Also tell them if you smoke, drink alcohol, or use illegal drugs. Some items may interact with your medicine. What should I watch for while using this medicine? Your condition will be monitored closely when you first begin therapy. Often, this drug is first started in a hospital or other monitored health care setting. Once you are on maintenance therapy, visit your doctor or health care professional for regular checks on your progress. Because your  condition and use of this medicine carry some risk, it is a good idea to carry an identification card, necklace or bracelet with details of your condition, medications, and doctor or health care professional. Dennis Bast may get drowsy or dizzy. Do not drive, use machinery, or do anything that needs mental alertness until you know how this medicine affects you. Do not stand or sit up quickly, especially if you are an older patient. This reduces the risk of dizzy or fainting spells. This medicine can make you more sensitive to the sun. Keep out of the sun. If you cannot avoid being in the sun, wear protective clothing and use sunscreen. Do not use sun lamps or tanning beds/booths. You should have regular eye exams before and during treatment. Call your doctor if you have blurred vision, see halos, or your eyes become sensitive to light. Your eyes may get dry. It may be helpful to use a lubricating eye solution or artificial tears solution. If you are going to have surgery or a procedure that requires contrast dyes, tell your doctor or health care professional that you are taking this medicine. What side effects may I notice from receiving this medicine? Side effects that you should report to your doctor or health care professional as soon as possible: -allergic reactions like skin rash, itching or hives, swelling of the face, lips, or tongue -blue-gray coloring of the skin -blurred vision, seeing blue green halos, increased sensitivity of the eyes to light -breathing problems -chest pain -dark urine -fast, irregular heartbeat -  feeling faint or light-headed -intolerance to heat or cold -nausea or vomiting -pain and swelling of the scrotum -pain, tingling, numbness in feet, hands -redness, blistering, peeling or loosening of the skin, including inside the mouth -spitting up blood -stomach pain -sweating -unusual or uncontrolled movements of body -unusually weak or tired -weight gain or loss -yellowing  of the eyes or skin Side effects that usually do not require medical attention (report to your doctor or health care professional if they continue or are bothersome): -change in sex drive or performance -constipation -dizziness -headache -loss of appetite -trouble sleeping This list may not describe all possible side effects. Call your doctor for medical advice about side effects. You may report side effects to FDA at 1-800-FDA-1088. Where should I keep my medicine? Keep out of the reach of children. Store at room temperature between 20 and 25 degrees C (68 and 77 degrees F). Protect from light. Keep container tightly closed. Throw away any unused medicine after the expiration date. NOTE: This sheet is a summary. It may not cover all possible information. If you have questions about this medicine, talk to your doctor, pharmacist, or health care provider.  2018 Elsevier/Gold Standard (2013-08-07 19:48:11)     If you need a refill on your cardiac medications before your next appointment, please call your pharmacy.

## 2017-02-25 NOTE — Addendum Note (Signed)
Addended by: Mattie Marlin on: 02/25/2017 10:52 AM   Modules accepted: Orders

## 2017-02-25 NOTE — Progress Notes (Signed)
Cardiology Office Note:    Date:  02/25/2017   ID:  NOCOLE ZAMMIT, DOB 03-13-1956, MRN [id number]  PCP:  Nicoletta Dress, MD  Cardiologist:  Jenean Lindau, MD   Referring MD: Nicoletta Dress, MD    ASSESSMENT:    1. Palpitation   2. Type 2 diabetes mellitus without complication, with long-term current use of insulin ([place])   3. Essential hypertension   4. Dyslipidemia   5. Morbid obesity ([place])   6. Paroxysmal atrial fibrillation ([place])    PLAN:    In order of problems listed above:  1. I discussed with the patient atrial fibrillation, disease process. Management and therapy including rate and rhythm control, anticoagulation benefits and potential risks were discussed extensively with the patient. Patient had multiple questions which were answered to patient's satisfaction. 2. Patient is currently in atrial fibrillation with rapid ventricular rate. She has undergone cardioversion. Her left atrial size is normal. She is on anticoagulation and takes it meticulously. I discussed with the rate and rhythm control issues with her and her daughter at extensive length. They're interested and rhythm control. Being a diabetic and with multiple risk factors for coronary artery disease and existing atherosclerotic vascular disease I would prefer that she either takes amiodarone or Dronedrone therapy. He and she cannot afford the latter.she already has issues with anticoagulation medication and the cost. I discussed amiodarone, benefits and potential this and she localized understanding. She will get eye check up by a local doctor and also we will refer her for pulmonary function testing. I noticed that her chest x-ray  was unremarkable and her liver function tests were fine. I will initiate her on any of 400 mg twice a day for a week and then 400 mg daily. She'll be seen in follow-up appointment in 2-3 week or earlier if she has any concerns. 3. Her blood pressure stable. Diet was discussed  with dyslipidemia and diabetes mellitus. Risks of obesity explained and she verbalized understanding. 4. She will be seen in follow-up appointment in 2-3 weeks or earlier if she has any concerns we also give her asamples for xarelto during this visit. 5. Total time for this evaluation was one hour.   Medication Adjustments/Labs and Tests Ordered: Current medicines are reviewed at length with the patient today.  Concerns regarding medicines are outlined above.  Orders Placed This Encounter  Procedures  . EKG 12-Lead   No orders of the defined types were placed in this encounter.    History of Present Illness:    Crystal Ramos is a 61 y.o. female who is being seen today for the evaluation of patient with paroxysmal atrial fibrillation at the request of Nicoletta Dress, MD. Patient is a pleasant 61 year old female. She has past medical history of essential hypertension, dyslipidemia and diabetes mellitus. Based on her reports from [place] hospital she also has atherosclerotic vascular disease. She was in the hospital for atrial fibrillation with rapid ventricular rate. She underwent a TEE guided cardioversion and was in sinus rhythm. Subsequently she was sent here for follow-up. She is now in atrial fibrillation with fairly controlled ventricular rate and gives history of shortness of breath on exertion. She underwent stress testing and also at [facility] several weeks ago and this test did not reveal any evidence of ischemia. At the time of my evaluation she is alert awake oriented and in no distress, and her daughter accompanies her.  Past Medical History:  Diagnosis Date  . Acute  diastolic heart failure ([place])   . Atrial fibrillation ([place])   . Chest pain   . Essential hypertension     Past Surgical History:  Procedure Laterality Date  . CHOLECYSTECTOMY      Current Medications: Current Meds  Medication Sig  . atorvastatin (LIPITOR) 40 MG tablet Take 40 mg by mouth daily.   . furosemide (LASIX) 20 MG tablet Take 20 mg by mouth daily.  Marland Kitchen glimepiride (AMARYL) 4 MG tablet Take 4 mg by mouth.  . linagliptin (TRADJENTA) 5 MG TABS tablet Take 5 mg by mouth.  Marland Kitchen lisinopril (PRINIVIL,ZESTRIL) 40 MG tablet Take 40 mg by mouth daily.  . metFORMIN (GLUCOPHAGE) 850 MG tablet Take 850 mg by mouth 3 (three) times daily.  . metoprolol succinate (TOPROL-XL) 50 MG 24 hr tablet Take 50 mg by mouth daily. Take with or immediately following a meal.  . potassium chloride SA (K-DUR,KLOR-CON) 20 MEQ tablet Take by mouth.  . rivaroxaban (XARELTO) 20 MG TABS tablet Take 20 mg by mouth daily with supper.     Allergies:   Patient has no known allergies.   Social History   Social History  . Marital status: Married    Spouse name: N/A  . Number of children: N/A  . Years of education: N/A   Social History Main Topics  . Smoking status: Never Smoker  . Smokeless tobacco: Never Used  . Alcohol use No  . Drug use: No  . Sexual activity: Not Asked   Other Topics Concern  . None   Social History Narrative  . None     Family History: The patient's family history includes Diabetes in her mother; Stroke in her father and paternal aunt.  ROS:   Please see the history of present illness.    All other systems reviewed and are negative.  EKGs/Labs/Other Studies Reviewed:    The following studies were reviewed today: I reviewed hospital records extensively including findings of the chest x-ray, echocardiogram and TEE findings.   Recent Labs: No results found for requested labs within last 8760 hours.  Recent Lipid Panel No results found for: CHOL, TRIG, HDL, CHOLHDL, VLDL, LDLCALC, LDLDIRECT  Physical Exam:    VS:  BP 98/60   Pulse (!) 114   Ht 5\' 2"  (1.575 m)   Wt 213 lb 1.9 oz (96.7 kg)   SpO2 98%   BMI 38.98 kg/m     Wt Readings from Last 3 Encounters:  02/25/17 213 lb 1.9 oz (96.7 kg)     GEN: Patient is in no acute distress HEENT: Normal NECK: No JVD; No  carotid bruits LYMPHATICS: No lymphadenopathy CARDIAC: S1 S2 regular, 2/6 systolic murmur at the apex. RESPIRATORY:  Clear to auscultation without rales, wheezing or rhonchi  ABDOMEN: Soft, non-tender, non-distended MUSCULOSKELETAL:  No edema; No deformity  SKIN: Warm and dry NEUROLOGIC:  Alert and oriented x 3 PSYCHIATRIC:  Normal affect    Signed, Jenean Lindau, MD  02/25/2017 10:34 AM    [place]

## 2017-03-18 ENCOUNTER — Ambulatory Visit: Payer: Medicare Other | Admitting: Cardiology

## 2017-03-22 ENCOUNTER — Encounter: Payer: Self-pay | Admitting: Cardiology

## 2017-03-22 ENCOUNTER — Ambulatory Visit (INDEPENDENT_AMBULATORY_CARE_PROVIDER_SITE_OTHER): Payer: Medicare Other | Admitting: Cardiology

## 2017-03-22 ENCOUNTER — Telehealth: Payer: Self-pay

## 2017-03-22 VITALS — BP 118/70 | HR 90 | Ht 61.0 in | Wt 210.1 lb

## 2017-03-22 DIAGNOSIS — R002 Palpitations: Secondary | ICD-10-CM

## 2017-03-22 DIAGNOSIS — I10 Essential (primary) hypertension: Secondary | ICD-10-CM | POA: Diagnosis not present

## 2017-03-22 DIAGNOSIS — I480 Paroxysmal atrial fibrillation: Secondary | ICD-10-CM

## 2017-03-22 DIAGNOSIS — E785 Hyperlipidemia, unspecified: Secondary | ICD-10-CM | POA: Diagnosis not present

## 2017-03-22 DIAGNOSIS — E119 Type 2 diabetes mellitus without complications: Secondary | ICD-10-CM

## 2017-03-22 DIAGNOSIS — R0609 Other forms of dyspnea: Secondary | ICD-10-CM

## 2017-03-22 DIAGNOSIS — I7090 Unspecified atherosclerosis: Secondary | ICD-10-CM

## 2017-03-22 MED ORDER — AMIODARONE HCL 200 MG PO TABS: 400.0000 mg | ORAL_TABLET | Freq: Every day | ORAL | 3 refills | Status: DC

## 2017-03-22 NOTE — Telephone Encounter (Signed)
Called patient to inform her of her PFT appointment with Oval Linsey on 11/08 at 10:00. Patient was instructed to arrive at 9:30 as well other applicable instructions for testing. No further questions or concerns from patient were made known.

## 2017-03-22 NOTE — Progress Notes (Signed)
Cardiology Office Note:    Date:  03/22/2017   ID:  Crystal Ramos, DOB 04-08-1956, MRN [id number]  PCP:  Nicoletta Dress, MD  Cardiologist:  Jenean Lindau, MD   Referring MD: Nicoletta Dress, MD    ASSESSMENT:    1. Atherosclerotic vascular disease   2. Essential hypertension   3. Paroxysmal atrial fibrillation (HCC)   4. Type 2 diabetes mellitus without complication, without long-term current use of insulin ([place])   5. Dyslipidemia   6. Dyspnea on exertion   7. Morbid obesity ([place])    PLAN:    In order of problems listed above:  1. I discussed my findings with the patient extensively.  I am going to continue her amiodarone therapy 400 mg a day for a week and then 200 mg daily.  She will be seen in follow-up appointment in 2 weeks.  She mentions to me that because she lost power at home she did not keep the PFT appointment and we will reschedule this for her.  I told her about taking anticoagulation on a very regular basis and benefits of risks explained and she vocalized understanding.  She will be seen in follow-up appointment in 2 weeks or earlier if she has any concerns.  At that time and I am going to consider cardioversion.  Left atrial size is within normal limits and her systolic function is preserved.   Medication Adjustments/Labs and Tests Ordered: Current medicines are reviewed at length with the patient today.  Concerns regarding medicines are outlined above.  No orders of the defined types were placed in this encounter.  No orders of the defined types were placed in this encounter.    Chief Complaint  Patient presents with  . Follow-up    doing well has not yet seen Dr. Alcide Clever for PFT study.      History of Present Illness:    Crystal Ramos is a 61 y.o. female.  The patient has essential hypertension, diabetes mellitus and dyslipidemia and obesity.  She has paroxysmal atrial fibrillation and she denies any problems at this time.  She underwent  cardioversion and subsequently went back into atrial fibrillation.  Last time I initiated her on amiodarone therapy kind of as a short-term measure for preparing her for cardioversion.  She has not taken her medications for the past 3 days.  Feels fine she denies any chest pain orthopnea or PND.  She tells me that she has gotten her energy level back but not completely.  She still has shortness of breath on exertion.  At the time of my evaluation she is alert awake oriented and in no distress.  Past Medical History:  Diagnosis Date  . Acute diastolic heart failure ([place])   . Atrial fibrillation ([place])   . Chest pain   . Essential hypertension     Past Surgical History:  Procedure Laterality Date  . CHOLECYSTECTOMY      Current Medications: Current Meds  Medication Sig  . amiodarone (PACERONE) 200 MG tablet Take 2 tablets (400 mg total) by mouth 2 (two) times daily. Take 400mg  twice a day for one week then take 400mg  daily  . atorvastatin (LIPITOR) 40 MG tablet Take 40 mg by mouth daily.  . furosemide (LASIX) 20 MG tablet Take 20 mg by mouth daily.  Marland Kitchen glimepiride (AMARYL) 4 MG tablet Take 4 mg by mouth.  . linagliptin (TRADJENTA) 5 MG TABS tablet Take 5 mg by mouth.  Marland Kitchen lisinopril (PRINIVIL,ZESTRIL) 40  MG tablet Take 40 mg by mouth daily.  . metFORMIN (GLUCOPHAGE) 850 MG tablet Take 850 mg by mouth 3 (three) times daily.  . metoprolol succinate (TOPROL-XL) 50 MG 24 hr tablet Take 50 mg by mouth daily. Take with or immediately following a meal.  . potassium chloride SA (K-DUR,KLOR-CON) 20 MEQ tablet Take by mouth.  . rivaroxaban (XARELTO) 20 MG TABS tablet Take 20 mg by mouth daily with supper.     Allergies:   Patient has no known allergies.   Social History   Socioeconomic History  . Marital status: Married    Spouse name: None  . Number of children: None  . Years of education: None  . Highest education level: None  Social Needs  . Financial resource strain: None  . Food  insecurity - worry: None  . Food insecurity - inability: None  . Transportation needs - medical: None  . Transportation needs - non-medical: None  Occupational History  . None  Tobacco Use  . Smoking status: Never Smoker  . Smokeless tobacco: Never Used  Substance and Sexual Activity  . Alcohol use: No  . Drug use: No  . Sexual activity: None  Other Topics Concern  . None  Social History Narrative  . None     Family History: The patient's family history includes Diabetes in her mother; Stroke in her father and paternal aunt.  ROS:   Please see the history of present illness.    All other systems reviewed and are negative.  EKGs/Labs/Other Studies Reviewed:    The following studies were reviewed today: I discussed my findings with the patient today.  Cardiac auscultation reveals irregular heart sounds.   Recent Labs: No results found for requested labs within last 8760 hours.  Recent Lipid Panel No results found for: CHOL, TRIG, HDL, CHOLHDL, VLDL, LDLCALC, LDLDIRECT  Physical Exam:    VS:  BP 118/70   Pulse 90   Ht 5\' 1"  (1.549 m)   Wt 210 lb 1.3 oz (95.3 kg)   SpO2 97%   BMI 39.69 kg/m     Wt Readings from Last 3 Encounters:  03/22/17 210 lb 1.3 oz (95.3 kg)  02/25/17 213 lb 1.9 oz (96.7 kg)     GEN: Patient is in no acute distress HEENT: Normal NECK: No JVD; No carotid bruits LYMPHATICS: No lymphadenopathy CARDIAC: Hear sounds irregular, 2/6 systolic murmur at the apex. RESPIRATORY:  Clear to auscultation without rales, wheezing or rhonchi  ABDOMEN: Soft, non-tender, non-distended MUSCULOSKELETAL:  No edema; No deformity  SKIN: Warm and dry NEUROLOGIC:  Alert and oriented x 3 PSYCHIATRIC:  Normal affect   Signed, Jenean Lindau, MD  03/22/2017 8:47 AM    [facility]

## 2017-03-22 NOTE — Patient Instructions (Signed)
Medication Instructions:  Your physician has recommended you make the following change in your medication:  CHANGE Amiodarone to 400 mg daily for 1 week then take 200 mg daily  Xarelto samples given for 1 month  Labwork: None  Testing/Procedures: Your physician has recommended that you have a pulmonary function test. Pulmonary Function Tests are a group of tests that measure how well air moves in and out of your lungs.  PFT's to be done at [facility] will call with appointment  Follow-Up: Your physician recommends that you schedule a follow-up appointment in: 2 weeks   Any Other Special Instructions Will Be Listed Below (If Applicable).     If you need a refill on your cardiac medications before your next appointment, please call your pharmacy.   [place], RN, BSN

## 2017-03-23 DIAGNOSIS — L400 Psoriasis vulgaris: Secondary | ICD-10-CM | POA: Diagnosis not present

## 2017-03-23 DIAGNOSIS — L4050 Arthropathic psoriasis, unspecified: Secondary | ICD-10-CM | POA: Diagnosis not present

## 2017-03-24 DIAGNOSIS — Z6841 Body Mass Index (BMI) 40.0 and over, adult: Secondary | ICD-10-CM | POA: Diagnosis not present

## 2017-03-24 DIAGNOSIS — E1165 Type 2 diabetes mellitus with hyperglycemia: Secondary | ICD-10-CM | POA: Diagnosis not present

## 2017-03-24 DIAGNOSIS — I10 Essential (primary) hypertension: Secondary | ICD-10-CM | POA: Diagnosis not present

## 2017-03-24 DIAGNOSIS — Z1231 Encounter for screening mammogram for malignant neoplasm of breast: Secondary | ICD-10-CM | POA: Diagnosis not present

## 2017-03-24 DIAGNOSIS — R600 Localized edema: Secondary | ICD-10-CM | POA: Diagnosis not present

## 2017-03-24 DIAGNOSIS — E785 Hyperlipidemia, unspecified: Secondary | ICD-10-CM | POA: Diagnosis not present

## 2017-03-24 DIAGNOSIS — I480 Paroxysmal atrial fibrillation: Secondary | ICD-10-CM | POA: Diagnosis not present

## 2017-03-25 DIAGNOSIS — R0609 Other forms of dyspnea: Secondary | ICD-10-CM | POA: Diagnosis not present

## 2017-03-25 LAB — PULMONARY FUNCTION TEST

## 2017-03-29 ENCOUNTER — Other Ambulatory Visit: Payer: Self-pay

## 2017-03-29 ENCOUNTER — Telehealth: Payer: Self-pay | Admitting: Pulmonary Disease

## 2017-03-29 DIAGNOSIS — R942 Abnormal results of pulmonary function studies: Secondary | ICD-10-CM

## 2017-03-29 NOTE — Telephone Encounter (Signed)
Informed patient of her abnormal PFT results from [place]. Also requested that patient call pulmonology to schedule a new visit; patient was agreeable.

## 2017-04-05 ENCOUNTER — Ambulatory Visit (INDEPENDENT_AMBULATORY_CARE_PROVIDER_SITE_OTHER): Payer: Medicare Other | Admitting: Cardiology

## 2017-04-05 ENCOUNTER — Encounter: Payer: Self-pay | Admitting: Cardiology

## 2017-04-05 VITALS — BP 100/60 | HR 103 | Ht 61.0 in | Wt 210.0 lb

## 2017-04-05 DIAGNOSIS — I480 Paroxysmal atrial fibrillation: Secondary | ICD-10-CM

## 2017-04-05 DIAGNOSIS — E785 Hyperlipidemia, unspecified: Secondary | ICD-10-CM | POA: Diagnosis not present

## 2017-04-05 DIAGNOSIS — I10 Essential (primary) hypertension: Secondary | ICD-10-CM

## 2017-04-05 DIAGNOSIS — R0609 Other forms of dyspnea: Secondary | ICD-10-CM

## 2017-04-05 DIAGNOSIS — E119 Type 2 diabetes mellitus without complications: Secondary | ICD-10-CM | POA: Diagnosis not present

## 2017-04-05 DIAGNOSIS — I7090 Unspecified atherosclerosis: Secondary | ICD-10-CM | POA: Diagnosis not present

## 2017-04-05 NOTE — Patient Instructions (Addendum)
Medication Instructions:  Your physician recommends that you continue on your current medications as directed. Please refer to the Current Medication list given to you today.   Labwork: Your physician recommends that you have the following labs drawn: CBC and BMP   Testing/Procedures:  DIET: Nothing to eat or drink after midnight except a sip of water with medications (see medication instructions below)  Medication Instructions: Hold Metoprolol and Metformin the day of the procedure  Continue your anticoagulant: Xarelto Please take your amiodarone and lisinopril the day of as well  You will need to continue your anticoagulant after your procedure until you are told by your  Provider that it is safe to stop  Labs: Can be done at [facility] the same day of procedure. Please arrive 1 hour and 30 mins early for blood work  You must have a responsible person to drive you home and stay in the waiting area during your procedure. Failure to do so could result in cancellation. Bring your insurance cards. *Special Note: Every effort is made to have your procedure done on time. Occasionally there are emergencies that occur at the hospital that may cause delays. Please be patient if a delay does occur.    Follow-Up: Your physician recommends that you schedule a follow-up appointment in: 1 month   Any Other Special Instructions Will Be Listed Below (If Applicable).     If you need a refill on your cardiac medications before your next appointment, please call your pharmacy.   [place], RN, BSN

## 2017-04-05 NOTE — Progress Notes (Signed)
Cardiology Office Note:    Date:  04/05/2017   ID:  Crystal Ramos, DOB [birth year], MRN [id number]  PCP:  Nicoletta Dress, MD  Cardiologist:  Jenean Lindau, MD   Referring MD: Nicoletta Dress, MD    ASSESSMENT:    1. Atherosclerotic vascular disease   2. Essential hypertension   3. Paroxysmal atrial fibrillation (HCC)   4. Type 2 diabetes mellitus without complication, without long-term current use of insulin ([place])   5. Dyslipidemia   6. Morbid obesity ([place])   7. Dyspnea on exertion    PLAN:    In order of problems listed above:  1. I discussed my findings with the patient and the daughter extensively.  Patient has atrial flutter and the heart rates well-controlled.  Patient remains in atrial flutter.  Patient is on amiodarone therapy.  Patient has taken anticoagulation very meticulously and not missed any doses in the past more than 3 weeks.  I discussed cardioversion, benefits and potential risks with the patient and daughter extensively and they verbalized understanding.  Further recommendations will be made based on the findings of the cardioversion which they have elected to undergo. 2. Plan patient is stable.  Diet was discussed with dyslipidemia diabetes mellitus and obesity.  Instructions were given about taking medications appropriately on the day of the cardioversion   Medication Adjustments/Labs and Tests Ordered: Current medicines are reviewed at length with the patient today.  Concerns regarding medicines are outlined above.  No orders of the defined types were placed in this encounter.  No orders of the defined types were placed in this encounter.    Chief Complaint  Patient presents with  . Follow-up    Med follow up. Has been feeling bettter. flutter once or twice but over all doing fine,      History of Present Illness:    Crystal Ramos is a 61 y.o. female.  Patient has past medical history of paroxysmal atrial fibrillation, essential  hypertension and dyslipidemia and diabetes mellitus.  She leads a sedentary lifestyle.  We have attempted chemical cardioversion with pharmacological means.  She denies any problems at this time when she is not doing much but when she tries to exert herself she has shortness of breath.  No orthopnea or PND.  At the time of my evaluation she is alert awake oriented and in no distress.  Denies any chest pain orthopnea or PND.  She denies any chest pain on exertion.  Past Medical History:  Diagnosis Date  . Acute diastolic heart failure ([place])   . Atrial fibrillation ([place])   . Chest pain   . Essential hypertension     Past Surgical History:  Procedure Laterality Date  . CHOLECYSTECTOMY      Current Medications: Current Meds  Medication Sig  . amiodarone (PACERONE) 200 MG tablet Take 2 tablets (400 mg total) daily by mouth. Take 400mg  once a day for 1 week then change to 200 mg daily  . atorvastatin (LIPITOR) 40 MG tablet Take 40 mg by mouth daily.  . furosemide (LASIX) 20 MG tablet Take 20 mg by mouth daily.  Marland Kitchen glimepiride (AMARYL) 4 MG tablet Take 4 mg by mouth.  . linagliptin (TRADJENTA) 5 MG TABS tablet Take 5 mg by mouth.  Marland Kitchen lisinopril (PRINIVIL,ZESTRIL) 40 MG tablet Take 40 mg by mouth daily.  . metFORMIN (GLUCOPHAGE) 850 MG tablet Take 850 mg by mouth 3 (three) times daily.  . metoprolol succinate (TOPROL-XL) 50 MG 24 hr  tablet Take 50 mg by mouth daily. Take with or immediately following a meal.  . potassium chloride SA (K-DUR,KLOR-CON) 20 MEQ tablet Take by mouth.  . rivaroxaban (XARELTO) 20 MG TABS tablet Take 20 mg by mouth daily with supper.     Allergies:   Patient has no known allergies.   Social History   Socioeconomic History  . Marital status: Married    Spouse name: None  . Number of children: None  . Years of education: None  . Highest education level: None  Social Needs  . Financial resource strain: None  . Food insecurity - worry: None  . Food insecurity -  inability: None  . Transportation needs - medical: None  . Transportation needs - non-medical: None  Occupational History  . None  Tobacco Use  . Smoking status: Never Smoker  . Smokeless tobacco: Never Used  Substance and Sexual Activity  . Alcohol use: No  . Drug use: No  . Sexual activity: None  Other Topics Concern  . None  Social History Narrative  . None     Family History: The patient's family history includes Diabetes in her mother; Stroke in her father and paternal aunt.  ROS:   Please see the history of present illness.    All other systems reviewed and are negative.  EKGs/Labs/Other Studies Reviewed:    The following studies were reviewed today: I discussed the findings of the tests done in the past.  EKG done today revealed atrial flutter with nonspecific ST-T changes   Recent Labs: No results found for requested labs within last 8760 hours.  Recent Lipid Panel No results found for: CHOL, TRIG, HDL, CHOLHDL, VLDL, LDLCALC, LDLDIRECT  Physical Exam:    VS:  BP 100/60 (BP Location: Right Arm, Patient Position: Sitting)   Pulse (!) 103   Ht 5\' 1"  (1.549 m)   Wt 210 lb (95.3 kg)   SpO2 98%   BMI 39.68 kg/m     Wt Readings from Last 3 Encounters:  04/05/17 210 lb (95.3 kg)  03/22/17 210 lb 1.3 oz (95.3 kg)  02/25/17 213 lb 1.9 oz (96.7 kg)     GEN: Patient is in no acute distress HEENT: Normal NECK: No JVD; No carotid bruits LYMPHATICS: No lymphadenopathy CARDIAC: Hear sounds irregular, 2/6 systolic murmur at the apex. RESPIRATORY:  Clear to auscultation without rales, wheezing or rhonchi  ABDOMEN: Soft, non-tender, non-distended MUSCULOSKELETAL:  No edema; No deformity  SKIN: Warm and dry NEUROLOGIC:  Alert and oriented x 3 PSYCHIATRIC:  Normal affect   Signed, Jenean Lindau, MD  04/05/2017 9:44 AM    [facility]

## 2017-04-05 NOTE — Addendum Note (Signed)
Addended by: Mattie Marlin on: 04/05/2017 10:30 AM   Modules accepted: Orders

## 2017-04-06 ENCOUNTER — Other Ambulatory Visit: Payer: Self-pay

## 2017-04-06 ENCOUNTER — Telehealth: Payer: Self-pay

## 2017-04-06 NOTE — Telephone Encounter (Signed)
Informed patient of her appointment date and time. Gave instruction to prep for her cardioversion. Patient was agreeable and did not have any further questions or concerns.

## 2017-04-06 NOTE — Addendum Note (Signed)
Addended by: Mattie Marlin on: 04/06/2017 10:08 AM   Modules accepted: Orders

## 2017-04-07 DIAGNOSIS — R531 Weakness: Secondary | ICD-10-CM | POA: Diagnosis not present

## 2017-04-07 DIAGNOSIS — L400 Psoriasis vulgaris: Secondary | ICD-10-CM | POA: Diagnosis not present

## 2017-04-12 ENCOUNTER — Encounter: Payer: Self-pay | Admitting: Pulmonary Disease

## 2017-04-12 ENCOUNTER — Ambulatory Visit (INDEPENDENT_AMBULATORY_CARE_PROVIDER_SITE_OTHER): Payer: Medicare Other | Admitting: Pulmonary Disease

## 2017-04-12 ENCOUNTER — Ambulatory Visit (HOSPITAL_BASED_OUTPATIENT_CLINIC_OR_DEPARTMENT_OTHER)
Admission: RE | Admit: 2017-04-12 | Discharge: 2017-04-12 | Disposition: A | Discharge status: Home / Self Care | Payer: Medicare Other | Source: Ambulatory Visit | Attending: Pulmonary Disease | Admitting: Pulmonary Disease

## 2017-04-12 VITALS — BP 109/75 | HR 75 | Ht 61.0 in | Wt 209.0 lb

## 2017-04-12 DIAGNOSIS — R0602 Shortness of breath: Secondary | ICD-10-CM | POA: Diagnosis not present

## 2017-04-12 DIAGNOSIS — R9389 Abnormal findings on diagnostic imaging of other specified body structures: Secondary | ICD-10-CM

## 2017-04-12 DIAGNOSIS — I480 Paroxysmal atrial fibrillation: Secondary | ICD-10-CM | POA: Diagnosis not present

## 2017-04-12 DIAGNOSIS — R918 Other nonspecific abnormal finding of lung field: Secondary | ICD-10-CM | POA: Insufficient documentation

## 2017-04-12 DIAGNOSIS — R0600 Dyspnea, unspecified: Secondary | ICD-10-CM | POA: Insufficient documentation

## 2017-04-12 NOTE — Progress Notes (Signed)
Subjective:   PATIENT ID: Crystal Ramos GENDER: female DOB: Oct 20, 1955, MRN: [id number]  Synopsis: Referred in November 2018 for Dyspnea. She has a history of Afib and has been on amiodarone since 01/2017.    HPI  Chief Complaint  Patient presents with  . Advice Only    Referred by Dr Alcide Clever for dyspnea    Dyspnea: > she says a few weeks back she had severe dyspnea and was noted to have a fast heart rate and an abnormal heart rhythm.   > she has been on oxygen in the past with her abnormal heart rhythm > she is dealing with a lot of abnormal heart rhythm problems.  She is supposed to have a cardioversion next week > she still notes some dyspnea when she is doing housework.  She has to stop and catch her breath.  She says that she can't constantly work due to the dyspnea, she has to take a lot of breaks. > the dyspnea has improved since they got her heart rate down, but it is not back to normal > she has had swelling in the past in her legs, better now > she has been on amiodarone since September  She doesn't think she has sleep apnea, though she reports a lot of trouble sleeping. > she can only sleep 3-4 hours, but then she is awake a lot > feels well rested in the mornings > rarely naps, not too often > no morning headaches  In the last year she has lost about 30 pounds, perhaps a little more.    Cough: > rare, doesn't produce anything > sometimes worse with eating > she doesn't get choked on food (that she knows of)   She has never smoked.  She used to work in a [employer].  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  Past Medical History:  Diagnosis Date  . Acute diastolic heart failure ([place])   . Atrial fibrillation ([place])   . Chest pain   . Essential hypertension      Family History  Problem Relation Age of Onset  . Diabetes Mother   . Stroke Father   . Stroke Paternal Aunt   . Breast cancer Paternal Aunt       Social History   Socioeconomic History  . Marital status: Married    Spouse name: Not on file  . Number of children: Not on file  . Years of education: Not on file  . Highest education level: Not on file  Social Needs  . Financial resource strain: Not on file  . Food insecurity - worry: Not on file  . Food insecurity - inability: Not on file  . Transportation needs - medical: Not on file  . Transportation needs - non-medical: Not on file  Occupational History  . Not on file  Tobacco Use  . Smoking status: Passive Smoke Exposure - Never Smoker  . Smokeless tobacco: Never Used  . Tobacco comment: "whole family" smoked in the house  Substance and Sexual Activity  . Alcohol use: No  . Drug use: No  . Sexual activity: Not on file  Other Topics Concern  . Not on file  Social History Narrative  . Not on file     No Known Allergies   Outpatient Medications Prior to Visit  Medication Sig Dispense Refill  . amiodarone (PACERONE) 200 MG tablet Take 2 tablets (400 mg total) daily  by mouth. Take 400mg  once a day for 1 week then change to 200 mg daily 90 tablet 3  . atorvastatin (LIPITOR) 40 MG tablet Take 40 mg by mouth daily.    . furosemide (LASIX) 20 MG tablet Take 20 mg by mouth daily.    Marland Kitchen glimepiride (AMARYL) 4 MG tablet Take 4 mg by mouth.    . linagliptin (TRADJENTA) 5 MG TABS tablet Take 5 mg by mouth.    Marland Kitchen lisinopril (PRINIVIL,ZESTRIL) 40 MG tablet Take 40 mg by mouth daily.    . metFORMIN (GLUCOPHAGE) 850 MG tablet Take 850 mg by mouth 3 (three) times daily.    . metoprolol succinate (TOPROL-XL) 50 MG 24 hr tablet Take 50 mg by mouth daily. Take with or immediately following a meal.    . potassium chloride SA (K-DUR,KLOR-CON) 20 MEQ tablet Take by mouth.    . rivaroxaban (XARELTO) 20 MG TABS tablet Take 20 mg by mouth daily with supper.     No facility-administered medications prior to visit.     Review of Systems  Constitutional: Negative for fever and weight  loss.  HENT: Negative for congestion, ear pain, nosebleeds and sore throat.   Eyes: Negative for redness.  Respiratory: Positive for shortness of breath. Negative for cough and wheezing.   Cardiovascular: Negative for palpitations, leg swelling and PND.  Gastrointestinal: Negative for nausea and vomiting.  Genitourinary: Negative for dysuria.  Skin: Negative for rash.  Neurological: Negative for headaches.  Endo/Heme/Allergies: Does not bruise/bleed easily.  Psychiatric/Behavioral: Negative for depression. The patient is not nervous/anxious.       Objective:  Physical Exam   Vitals:   04/12/17 1334  BP: 109/75  Pulse: 75  SpO2: 97%  Weight: 209 lb (94.8 kg)  Height: 5\' 1"  (1.549 m)   Gen: obese but well appearing, no acute distress HENT: NCAT, OP clear, neck supple without masses Eyes: PERRL, EOMi Lymph: no cervical lymphadenopathy PULM: CTA B CV: Irreg irreg, no mgr, no JVD GI: BS+, soft, nontender, no hsm Derm: no rash or skin breakdown MSK: normal bulk and tone Neuro: A&Ox4, CN II-XII intact, strength 5/5 in all 4 extremities Psyche: normal mood and affect   CBC No results found for: WBC, RBC, HGB, HCT, PLT, MCV, MCH, MCHC, RDW, LYMPHSABS, MONOABS, EOSABS, BASOSABS   Chest imaging: 01/2017 CT chest> images independently reviewed showing intralobular septal thickening throughout, bilateral small pleural effusions, patchy groundglass and consolidation worse in the bases.  PFT: Performed 03/2017, records not available   Labs:  Path:  Echo:  Heart Catheterization:  Records from her visit with cardiology last week reviewed, she was cared for her Afib and Hypertension. She takes amiodarone.     Assessment & Plan:   Dyspnea, unspecified type - Plan: DG Chest 2 View, CBC w/Diff, B Nat Peptide  Paroxysmal atrial fibrillation (HCC)  Abnormal CT of the chest  Discussion: Crystal Ramos is here to see me today for shortness of breath.    I explained to her today  that the differential diagnosis of shortness of breath is broad and includes lung disease, heart disease, neurologic conditions, hematologic conditions, and others.  In her particular case I think that obesity and deconditioning contribute and the recent trouble with atrial fibrillation is the most likely cause.  However, when I reviewed the CT scan of her chest in September 2018 there was clear evidence of pulmonary venous hypertension with intralobular septal thickening and bilateral pleural effusions, but there was also concern for an abnormal  pulmonary process because there is patchy consolidation in the bases of both lungs.  Given the fact that she has recently been treated with amiodarone I am anxious to see the results of lung function testing.  I want a repeat a CBC to make sure she is not anemic and check a BMP to see if she is holding on the fluid.  If she is still symptomatic after her upcoming cardioversion then we need to repeat a high-resolution CT scan of the chest to look for evidence of amiodarone toxicity.  Shortness of breath: I think this is most likely related to the heart rhythm problem you have been experiencing but we need to make sure that there is not a lung problem related to the amiodarone. I will obtain records of the lung function test which was performed in [place] We will check a walking test and monitor your oxygen level while you are walking We will check a blood test called a CBC to look to see if you are anemic and we will also check a lab test called a BNP to see if you are holding onto fluid We will check a chest x-ray After you have had the next cardioversion test I would like to see you back to see if you are still having trouble with shortness of breath.  If you are then we will make arrangements for a high-resolution CT scan of the chest  Abnormal CT scan of the chest: Workup as above  We will see you back in 3-4 weeks either with me or the nurse  practitioner   Current Outpatient Medications:  .  amiodarone (PACERONE) 200 MG tablet, Take 2 tablets (400 mg total) daily by mouth. Take 400mg  once a day for 1 week then change to 200 mg daily, Disp: 90 tablet, Rfl: 3 .  atorvastatin (LIPITOR) 40 MG tablet, Take 40 mg by mouth daily., Disp: , Rfl:  .  furosemide (LASIX) 20 MG tablet, Take 20 mg by mouth daily., Disp: , Rfl:  .  glimepiride (AMARYL) 4 MG tablet, Take 4 mg by mouth., Disp: , Rfl:  .  linagliptin (TRADJENTA) 5 MG TABS tablet, Take 5 mg by mouth., Disp: , Rfl:  .  lisinopril (PRINIVIL,ZESTRIL) 40 MG tablet, Take 40 mg by mouth daily., Disp: , Rfl:  .  metFORMIN (GLUCOPHAGE) 850 MG tablet, Take 850 mg by mouth 3 (three) times daily., Disp: , Rfl:  .  metoprolol succinate (TOPROL-XL) 50 MG 24 hr tablet, Take 50 mg by mouth daily. Take with or immediately following a meal., Disp: , Rfl:  .  potassium chloride SA (K-DUR,KLOR-CON) 20 MEQ tablet, Take by mouth., Disp: , Rfl:  .  rivaroxaban (XARELTO) 20 MG TABS tablet, Take 20 mg by mouth daily with supper., Disp: , Rfl:

## 2017-04-12 NOTE — Addendum Note (Signed)
Addended by: Len Blalock on: 04/12/2017 04:01 PM   Modules accepted: Orders

## 2017-04-12 NOTE — Patient Instructions (Signed)
Shortness of breath: I think this is most likely related to the heart rhythm problem you have been experiencing but we need to make sure that there is not a lung problem related to the amiodarone. I will obtain records of the lung function test which was performed in [place] We will check a walking test and monitor your oxygen level while you are walking We will check a blood test called a CBC to look to see if you are anemic and we will also check a lab test called a BNP to see if you are holding onto fluid We will check a chest x-ray After you have had the next cardioversion test I would like to see you back to see if you are still having trouble with shortness of breath.  If you are then we will make arrangements for a high-resolution CT scan of the chest  Abnormal CT scan of the chest: Workup as above  We will see you back in 3-4 weeks either with me or the nurse practitioner

## 2017-04-13 ENCOUNTER — Telehealth: Payer: Self-pay | Admitting: Cardiology

## 2017-04-13 LAB — CBC WITH DIFFERENTIAL/PLATELET
BASOS ABS: 0 10*3/uL (ref 0.0–0.2)
Basos: 0 %
EOS (ABSOLUTE): 0.1 10*3/uL (ref 0.0–0.4)
EOS: 1 %
HEMATOCRIT: 43.4 % (ref 34.0–46.6)
HEMOGLOBIN: 14.4 g/dL (ref 11.1–15.9)
IMMATURE GRANS (ABS): 0 10*3/uL (ref 0.0–0.1)
Immature Granulocytes: 0 %
LYMPHS ABS: 2.3 10*3/uL (ref 0.7–3.1)
LYMPHS: 30 %
MCH: 27.9 pg (ref 26.6–33.0)
MCHC: 33.2 g/dL (ref 31.5–35.7)
MCV: 84 fL (ref 79–97)
MONOCYTES: 4 %
Monocytes Absolute: 0.3 10*3/uL (ref 0.1–0.9)
NEUTROS ABS: 5 10*3/uL (ref 1.4–7.0)
Neutrophils: 65 %
Platelets: 178 10*3/uL (ref 150–379)
RBC: 5.16 x10E6/uL (ref 3.77–5.28)
RDW: 15 % (ref 12.3–15.4)
WBC: 7.8 10*3/uL (ref 3.4–10.8)

## 2017-04-13 LAB — BRAIN NATRIURETIC PEPTIDE: BNP: 62.3 pg/mL (ref 0.0–100.0)

## 2017-04-13 NOTE — Telephone Encounter (Signed)
What's the remainder of the fax number?

## 2017-04-13 NOTE — Telephone Encounter (Signed)
[place]

## 2017-04-13 NOTE — Telephone Encounter (Signed)
States pt is coming for cardioversion tomorrow and they have not received any information(order, H&P, etc)  Fax to [phone]

## 2017-04-14 DIAGNOSIS — I4891 Unspecified atrial fibrillation: Secondary | ICD-10-CM | POA: Diagnosis not present

## 2017-04-14 DIAGNOSIS — I5031 Acute diastolic (congestive) heart failure: Secondary | ICD-10-CM | POA: Diagnosis not present

## 2017-04-14 DIAGNOSIS — Z7901 Long term (current) use of anticoagulants: Secondary | ICD-10-CM | POA: Diagnosis not present

## 2017-04-14 DIAGNOSIS — Z7984 Long term (current) use of oral hypoglycemic drugs: Secondary | ICD-10-CM | POA: Diagnosis not present

## 2017-04-14 DIAGNOSIS — E119 Type 2 diabetes mellitus without complications: Secondary | ICD-10-CM | POA: Diagnosis not present

## 2017-04-14 DIAGNOSIS — I480 Paroxysmal atrial fibrillation: Secondary | ICD-10-CM | POA: Diagnosis not present

## 2017-04-14 DIAGNOSIS — I7090 Unspecified atherosclerosis: Secondary | ICD-10-CM | POA: Diagnosis not present

## 2017-04-14 DIAGNOSIS — I110 Hypertensive heart disease with heart failure: Secondary | ICD-10-CM | POA: Diagnosis not present

## 2017-04-14 DIAGNOSIS — E785 Hyperlipidemia, unspecified: Secondary | ICD-10-CM | POA: Diagnosis not present

## 2017-04-14 DIAGNOSIS — Z6839 Body mass index (BMI) 39.0-39.9, adult: Secondary | ICD-10-CM | POA: Diagnosis not present

## 2017-04-14 DIAGNOSIS — Z79899 Other long term (current) drug therapy: Secondary | ICD-10-CM | POA: Diagnosis not present

## 2017-04-19 NOTE — Addendum Note (Signed)
Addended by: Mattie Marlin on: 04/19/2017 08:26 AM   Modules accepted: Orders

## 2017-05-03 ENCOUNTER — Ambulatory Visit (INDEPENDENT_AMBULATORY_CARE_PROVIDER_SITE_OTHER): Payer: Medicare Other | Admitting: Cardiology

## 2017-05-03 ENCOUNTER — Encounter: Payer: Self-pay | Admitting: Cardiology

## 2017-05-03 VITALS — BP 98/60 | HR 50 | Ht 61.0 in | Wt 212.1 lb

## 2017-05-03 DIAGNOSIS — E119 Type 2 diabetes mellitus without complications: Secondary | ICD-10-CM | POA: Diagnosis not present

## 2017-05-03 DIAGNOSIS — I10 Essential (primary) hypertension: Secondary | ICD-10-CM | POA: Diagnosis not present

## 2017-05-03 DIAGNOSIS — E785 Hyperlipidemia, unspecified: Secondary | ICD-10-CM

## 2017-05-03 DIAGNOSIS — I480 Paroxysmal atrial fibrillation: Secondary | ICD-10-CM

## 2017-05-03 DIAGNOSIS — I5042 Chronic combined systolic (congestive) and diastolic (congestive) heart failure: Secondary | ICD-10-CM

## 2017-05-03 DIAGNOSIS — I7090 Unspecified atherosclerosis: Secondary | ICD-10-CM

## 2017-05-03 NOTE — Progress Notes (Signed)
Cardiology Office Note:    Date:  05/03/2017   ID:  Crystal Ramos, DOB 1956-05-13, MRN [id number]  PCP:  Nicoletta Dress, MD  Cardiologist:  Jenean Lindau, MD   Referring MD: Nicoletta Dress, MD    ASSESSMENT:    1. PAF (paroxysmal atrial fibrillation) ([place])   2. Atherosclerotic vascular disease   3. Chronic combined systolic and diastolic congestive heart failure ([place])   4. Essential hypertension   5. Paroxysmal atrial fibrillation (H[facility])   6. Type 2 diabetes mellitus without complication, without long-term current use of insulin (H[facility])   7. Dyslipidemia   8. Morbid obesity ([place])    PLAN:    In order of problems listed above:  1. Secondary prevention stressed with the patient.  Importance of compliance with diet and medications stressed and she vocalized understanding. 2. Cardioversion issues were discussed.  I discussed with the patient atrial fibrillation, disease process. Management and therapy including rate and rhythm control, anticoagulation benefits and potential risks were discussed extensively with the patient. Patient had multiple questions which were answered to patient's satisfaction. 3. Her blood pressure is stable.  Diet was discussed with dyslipidemia diabetes mellitus and obesity.  Risks of obesity explained she vocalized understanding. 4. Her blood pressure is borderline and therefore I have stopped her furosemide and potassium supplementation.  She will have a Chem-7 today. 5. She will continue 400 mg of amiodarone for a week then drop it to 200 mg.  She will be seen in follow-up appointment in a month or earlier if she has any concerns.  Importance of regular exercise and weight loss stress test at extensive length and she verbalized understanding.   Medication Adjustments/Labs and Tests Ordered: Current medicines are reviewed at length with the patient today.  Concerns regarding medicines are outlined above.  Orders Placed This Encounter  Procedures    . Basic Metabolic Panel (BMET)   No orders of the defined types were placed in this encounter.    Chief Complaint  Patient presents with  . Follow-up    [facility]CV at [facility] on 11/28     History of Present Illness:    Crystal Ramos is a 61 y.o. female.  She has past medical history of paroxysmal atrial fibrillation, essential hypertension dyslipidemia, diabetes mellitus and morbid obesity.  She underwent unsuccessful cardioversion and therefore she was put on amiodarone therapy and sent for a repeat cardioversion which seems to be successful at this time.  She feels much better and is here for follow-up appointment.  No chest pain orthopnea or PND.  Her dyspnea on exertion is significantly better.  At the time of my evaluation she is alert awake oriented and in no distress.  Past Medical History:  Diagnosis Date  . Acute diastolic heart failure ([place])   . Atrial fibrillation ([place])   . Chest pain   . Essential hypertension     Past Surgical History:  Procedure Laterality Date  . CHOLECYSTECTOMY      Current Medications: Current Meds  Medication Sig  . amiodarone (PACERONE) 200 MG tablet Take 2 tablets (400 mg total) daily by mouth. Take 400mg  once a day for 1 week then change to 200 mg daily  . atorvastatin (LIPITOR) 40 MG tablet Take 40 mg by mouth daily.  Marland Kitchen glimepiride (AMARYL) 4 MG tablet Take 4 mg by mouth.  . linagliptin (TRADJENTA) 5 MG TABS tablet Take 5 mg by mouth.  Marland Kitchen lisinopril (PRINIVIL,ZESTRIL) 40 MG tablet Take 40  mg by mouth daily.  . metFORMIN (GLUCOPHAGE) 850 MG tablet Take 850 mg by mouth 3 (three) times daily.  . metoprolol succinate (TOPROL-XL) 50 MG 24 hr tablet Take 50 mg by mouth daily. Take with or immediately following a meal.  . rivaroxaban (XARELTO) 20 MG TABS tablet Take 20 mg by mouth daily with supper.  . [DISCONTINUED] furosemide (LASIX) 20 MG tablet Take 20 mg by mouth daily.  . [DISCONTINUED] potassium chloride SA (K-DUR,KLOR-CON) 20 MEQ tablet Take  by mouth.     Allergies:   Patient has no known allergies.   Social History   Socioeconomic History  . Marital status: Married    Spouse name: None  . Number of children: None  . Years of education: None  . Highest education level: None  Social Needs  . Financial resource strain: None  . Food insecurity - worry: None  . Food insecurity - inability: None  . Transportation needs - medical: None  . Transportation needs - non-medical: None  Occupational History  . None  Tobacco Use  . Smoking status: Passive Smoke Exposure - Never Smoker  . Smokeless tobacco: Never Used  . Tobacco comment: "whole family" smoked in the house  Substance and Sexual Activity  . Alcohol use: No  . Drug use: No  . Sexual activity: None  Other Topics Concern  . None  Social History Narrative  . None     Family History: The patient's family history includes Breast cancer in her paternal aunt; Diabetes in her mother; Stroke in her father and paternal aunt.  ROS:   Please see the history of present illness.    All other systems reviewed and are negative.  EKGs/Labs/Other Studies Reviewed:    The following studies were reviewed today: I reviewed elective cardioversion reports and discussed with the patient at extensive length and she vocalized understanding and questions were answered to her satisfaction.   Recent Labs: 04/12/2017: BNP 62.3; Hemoglobin 14.4; Platelets 178  Recent Lipid Panel No results found for: CHOL, TRIG, HDL, CHOLHDL, VLDL, LDLCALC, LDLDIRECT  Physical Exam:    VS:  BP 98/60 (BP Location: Left Arm, Patient Position: Sitting)   Pulse (!) 50   Ht 5\' 1"  (1.549 m)   Wt 212 lb 1.3 oz (96.2 kg)   SpO2 98%   BMI 40.07 kg/m     Wt Readings from Last 3 Encounters:  05/03/17 212 lb 1.3 oz (96.2 kg)  04/12/17 209 lb (94.8 kg)  04/05/17 210 lb (95.3 kg)     GEN: Patient is in no acute distress HEENT: Normal NECK: No JVD; No carotid bruits LYMPHATICS: No  lymphadenopathy CARDIAC: Hear sounds regular, 2/6 systolic murmur at the apex. RESPIRATORY:  Clear to auscultation without rales, wheezing or rhonchi  ABDOMEN: Soft, non-tender, non-distended MUSCULOSKELETAL:  No edema; No deformity  SKIN: Warm and dry NEUROLOGIC:  Alert and oriented x 3 PSYCHIATRIC:  Normal affect   Signed, Jenean Lindau, MD  05/03/2017 8:52 AM    [facility]

## 2017-05-03 NOTE — Patient Instructions (Signed)
Medication Instructions:  Your physician has recommended you make the following change in your medication:  STOP Lasix and potassium CHANGE Amiodarone to 400 mg daily for one week then change to 200 mg daily  Labwork: Your physician recommends that you have the following labs drawn: BMP today  Testing/Procedures: None  Follow-Up: Your physician recommends that you schedule a follow-up appointment in: 1 month  Any Other Special Instructions Will Be Listed Below (If Applicable).     If you need a refill on your cardiac medications before your next appointment, please call your pharmacy.   [place], RN, BSN

## 2017-05-04 LAB — BASIC METABOLIC PANEL
BUN / CREAT RATIO: 24 (ref 12–28)
BUN: 24 mg/dL (ref 8–27)
CALCIUM: 9.5 mg/dL (ref 8.7–10.3)
CO2: 24 mmol/L (ref 20–29)
Chloride: 102 mmol/L (ref 96–106)
Creatinine, Ser: 1.01 mg/dL — ABNORMAL HIGH (ref 0.57–1.00)
GFR, EST AFRICAN AMERICAN: 68 mL/min/{1.73_m2} (ref >59)
GFR, EST NON AFRICAN AMERICAN: 59 mL/min/{1.73_m2} — AB (ref >59)
Glucose: 138 mg/dL — ABNORMAL HIGH (ref 65–99)
POTASSIUM: 4.3 mmol/L (ref 3.5–5.2)
Sodium: 141 mmol/L (ref 134–144)

## 2017-05-06 ENCOUNTER — Ambulatory Visit: Payer: Medicare Other | Admitting: Adult Health

## 2017-05-31 ENCOUNTER — Ambulatory Visit: Payer: Medicare Other | Admitting: Pulmonary Disease

## 2017-06-03 ENCOUNTER — Ambulatory Visit: Payer: Medicare Other | Admitting: Cardiology

## 2017-06-07 ENCOUNTER — Ambulatory Visit: Payer: PPO | Admitting: Cardiology

## 2017-06-07 ENCOUNTER — Encounter: Payer: Self-pay | Admitting: Cardiology

## 2017-06-07 VITALS — BP 114/70 | HR 68 | Ht 61.0 in | Wt 214.0 lb

## 2017-06-07 DIAGNOSIS — I7090 Unspecified atherosclerosis: Secondary | ICD-10-CM | POA: Diagnosis not present

## 2017-06-07 DIAGNOSIS — I480 Paroxysmal atrial fibrillation: Secondary | ICD-10-CM

## 2017-06-07 DIAGNOSIS — I5042 Chronic combined systolic (congestive) and diastolic (congestive) heart failure: Secondary | ICD-10-CM | POA: Diagnosis not present

## 2017-06-07 DIAGNOSIS — E119 Type 2 diabetes mellitus without complications: Secondary | ICD-10-CM | POA: Diagnosis not present

## 2017-06-07 DIAGNOSIS — Z1329 Encounter for screening for other suspected endocrine disorder: Secondary | ICD-10-CM | POA: Diagnosis not present

## 2017-06-07 DIAGNOSIS — R945 Abnormal results of liver function studies: Secondary | ICD-10-CM | POA: Diagnosis not present

## 2017-06-07 DIAGNOSIS — R7989 Other specified abnormal findings of blood chemistry: Secondary | ICD-10-CM

## 2017-06-07 DIAGNOSIS — E785 Hyperlipidemia, unspecified: Secondary | ICD-10-CM | POA: Diagnosis not present

## 2017-06-07 DIAGNOSIS — R079 Chest pain, unspecified: Secondary | ICD-10-CM

## 2017-06-07 DIAGNOSIS — I10 Essential (primary) hypertension: Secondary | ICD-10-CM

## 2017-06-07 MED ORDER — NITROGLYCERIN 0.4 MG SL SUBL: 0.4000 mg | SUBLINGUAL_TABLET | SUBLINGUAL | 6 refills | Status: DC | PRN

## 2017-06-07 NOTE — Patient Instructions (Addendum)
Medication Instructions:  Your physician has recommended you make the following change in your medication:  START Nitroglycerin 0.4 mg sublingual (under your tongue) as needed for chest pain. If experiencing chest pain, stop what you are doing and sit down. Take 1 nitroglycerin and wait 5 minutes. If chest pain continues, take another nitroglycerin and wait 5 minutes. If chest pain does not subside, take 1 more nitroglycerin and dial 911. You make take a total of 3 nitroglycerin in a 15 minute time frame.  Please call office with correct metoprolol prescription  Labwork: Your physician recommends that you have the following labs drawn: CBC, BMP, TSH, PTT and liver/lipid panel  Testing/Procedures: Your physician has requested that you have cardiac CT. Cardiac computed tomography (CT) is a painless test that uses an x-ray machine to take clear, detailed pictures of your heart. For further information please visit [email or web address]. Please follow instruction sheet as given.  Follow-Up: Your physician recommends that you schedule a follow-up appointment in: 3 months  Any Other Special Instructions Will Be Listed Below (If Applicable).  Nitroglycerin sublingual tablets What is this medicine? NITROGLYCERIN (nye troe GLI ser in) is a type of vasodilator. It relaxes blood vessels, increasing the blood and oxygen supply to your heart. This medicine is used to relieve chest pain caused by angina. It is also used to prevent chest pain before activities like climbing stairs, going outdoors in cold weather, or sexual activity. This medicine may be used for other purposes; ask your health care provider or pharmacist if you have questions. COMMON BRAND NAME(S): Nitroquick, Nitrostat, Nitrotab What should I tell my health care provider before I take this medicine? They need to know if you have any of these conditions: -anemia -head injury, recent stroke, or bleeding in the brain -liver disease -previous  heart attack -an unusual or allergic reaction to nitroglycerin, other medicines, foods, dyes, or preservatives -pregnant or trying to get pregnant -breast-feeding How should I use this medicine? Take this medicine by mouth as needed. At the first sign of an angina attack (chest pain or tightness) place one tablet under your tongue. You can also take this medicine 5 to 10 minutes before an event likely to produce chest pain. Follow the directions on the prescription label. Let the tablet dissolve under the tongue. Do not swallow whole. Replace the dose if you accidentally swallow it. It will help if your mouth is not dry. Saliva around the tablet will help it to dissolve more quickly. Do not eat or drink, smoke or chew tobacco while a tablet is dissolving. If you are not better within 5 minutes after taking ONE dose of nitroglycerin, call 9-1-1 immediately to seek emergency medical care. Do not take more than 3 nitroglycerin tablets over 15 minutes. If you take this medicine often to relieve symptoms of angina, your doctor or health care professional may provide you with different instructions to manage your symptoms. If symptoms do not go away after following these instructions, it is important to call 9-1-1 immediately. Do not take more than 3 nitroglycerin tablets over 15 minutes. Talk to your pediatrician regarding the use of this medicine in children. Special care may be needed. Overdosage: If you think you have taken too much of this medicine contact a poison control center or emergency room at once. NOTE: This medicine is only for you. Do not share this medicine with others. What if I miss a dose? This does not apply. This medicine is only used as needed. What may  interact with this medicine? Do not take this medicine with any of the following medications: -certain migraine medicines like ergotamine and dihydroergotamine (DHE) -medicines used to treat erectile dysfunction like sildenafil,  tadalafil, and vardenafil -riociguat This medicine may also interact with the following medications: -alteplase -aspirin -heparin -medicines for high blood pressure -medicines for mental depression -other medicines used to treat angina -phenothiazines like chlorpromazine, mesoridazine, prochlorperazine, thioridazine This list may not describe all possible interactions. Give your health care provider a list of all the medicines, herbs, non-prescription drugs, or dietary supplements you use. Also tell them if you smoke, drink alcohol, or use illegal drugs. Some items may interact with your medicine. What should I watch for while using this medicine? Tell your doctor or health care professional if you feel your medicine is no longer working. Keep this medicine with you at all times. Sit or lie down when you take your medicine to prevent falling if you feel dizzy or faint after using it. Try to remain calm. This will help you to feel better faster. If you feel dizzy, take several deep breaths and lie down with your feet propped up, or bend forward with your head resting between your knees. You may get drowsy or dizzy. Do not drive, use machinery, or do anything that needs mental alertness until you know how this drug affects you. Do not stand or sit up quickly, especially if you are an older patient. This reduces the risk of dizzy or fainting spells. Alcohol can make you more drowsy and dizzy. Avoid alcoholic drinks. Do not treat yourself for coughs, colds, or pain while you are taking this medicine without asking your doctor or health care professional for advice. Some ingredients may increase your blood pressure. What side effects may I notice from receiving this medicine? Side effects that you should report to your doctor or health care professional as soon as possible: -blurred vision -dry mouth -skin rash -sweating -the feeling of extreme pressure in the head -unusually weak or tired Side  effects that usually do not require medical attention (report to your doctor or health care professional if they continue or are bothersome): -flushing of the face or neck -headache -irregular heartbeat, palpitations -nausea, vomiting This list may not describe all possible side effects. Call your doctor for medical advice about side effects. You may report side effects to FDA at 1-800-FDA-1088. Where should I keep my medicine? Keep out of the reach of children. Store at room temperature between 20 and 25 degrees C (68 and 77 degrees F). Store in [facility] [employer] [place]. Protect from light and moisture. Keep tightly closed. Throw away any unused medicine after the expiration date. NOTE: This sheet is a summary. It may not cover all possible information. If you have questions about this medicine, talk to your doctor, pharmacist, or health care provider.  2018 Elsevier/Gold Standard (2013-03-02 17:57:36)  Cardiac CT Angiogram A cardiac CT angiogram is a procedure to look at the heart and the area around the heart. It may be done to help find the cause of chest pains or other symptoms of heart disease. During this procedure, a large X-ray machine, called a CT scanner, takes detailed pictures of the heart and the surrounding area after a dye (contrast material) has been injected into blood vessels in the area. The procedure is also sometimes called a coronary CT angiogram, coronary artery scanning, or CTA. A cardiac CT angiogram allows the health care provider to see how well blood is flowing to and  from the heart. The health care provider will be able to see if there are any problems, such as:  Blockage or narrowing of the coronary arteries in the heart.  Fluid around the heart.  Signs of weakness or disease in the muscles, valves, and tissues of the heart.  Tell a health care provider about:  Any allergies you have. This is especially important if you have had a previous allergic reaction to  contrast dye.  All medicines you are taking, including vitamins, herbs, eye drops, creams, and over-the-counter medicines.  Any blood disorders you have.  Any surgeries you have had.  Any medical conditions you have.  Whether you are pregnant or may be pregnant.  Any anxiety disorders, chronic pain, or other conditions you have that may increase your stress or prevent you from lying still. What are the risks? Generally, this is a safe procedure. However, problems may occur, including:  Bleeding.  Infection.  Allergic reactions to medicines or dyes.  Damage to other structures or organs.  Kidney damage from the dye or contrast that is used.  Increased risk of cancer from radiation exposure. This risk is low. Talk with your health care provider about: ? The risks and benefits of testing. ? How you can receive the lowest dose of radiation.  What happens before the procedure?  Wear comfortable clothing and remove any jewelry, glasses, dentures, and hearing aids.  Follow instructions from your health care provider about eating and drinking. This may include: ? For 12 hours before the test - avoid caffeine. This includes tea, coffee, soda, energy drinks, and diet pills. Drink plenty of water or other fluids that do not have caffeine in them. Being well-hydrated can prevent complications. ? For 4-6 hours before the test - stop eating and drinking. The contrast dye can cause nausea, but this is less likely if your stomach is empty.  Ask your health care provider about changing or stopping your regular medicines. This is especially important if you are taking diabetes medicines, blood thinners, or medicines to treat erectile dysfunction. What happens during the procedure?  Hair on your chest may need to be removed so that small sticky patches called electrodes can be placed on your chest. These will transmit information that helps to monitor your heart during the test.  An IV tube  will be inserted into one of your veins.  You might be given a medicine to control your heart rate during the test. This will help to ensure that good images are obtained.  You will be asked to lie on an exam table. This table will slide in and out of the CT machine during the procedure.  Contrast dye will be injected into the IV tube. You might feel warm, or you may get a metallic taste in your mouth.  You will be given a medicine (nitroglycerin) to relax (dilate) the arteries in your heart.  The table that you are lying on will move into the CT machine tunnel for the scan.  The person running the machine will give you instructions while the scans are being done. You may be asked to: ? Keep your arms above your head. ? Hold your breath. ? Stay very still, even if the table is moving.  When the scanning is complete, you will be moved out of the machine.  The IV tube will be removed. The procedure may vary among health care providers and hospitals. What happens after the procedure?  You might feel warm, or  you may get a metallic taste in your mouth from the contrast dye.  You may have a headache from the nitroglycerin.  After the procedure, drink water or other fluids to wash (flush) the contrast material out of your body.  Contact a health care provider if you have any symptoms of allergy to the contrast. These symptoms include: ? Shortness of breath. ? Rash or hives. ? A racing heartbeat.  Most people can return to their normal activities right after the procedure. Ask your health care provider what activities are safe for you.  It is up to you to get the results of your procedure. Ask your health care provider, or the department that is doing the procedure, when your results will be ready. Summary  A cardiac CT angiogram is a procedure to look at the heart and the area around the heart. It may be done to help find the cause of chest pains or other symptoms of heart  disease.  During this procedure, a large X-ray machine, called a CT scanner, takes detailed pictures of the heart and the surrounding area after a dye (contrast material) has been injected into blood vessels in the area.  Ask your health care provider about changing or stopping your regular medicines before the procedure. This is especially important if you are taking diabetes medicines, blood thinners, or medicines to treat erectile dysfunction.  After the procedure, drink water or other fluids to wash (flush) the contrast material out of your body. This information is not intended to replace advice given to you by your health care provider. Make sure you discuss any questions you have with your health care provider. Document Released: 04/16/2008 Document Revised: 03/23/2016 Document Reviewed: 03/23/2016 Elsevier Interactive Patient Education  2017 [employer].    If you need a refill on your cardiac medications before your next appointment, please call your pharmacy.   [place], RN, BSN

## 2017-06-07 NOTE — Progress Notes (Signed)
Cardiology Office Note:    Date:  06/07/2017   ID:  SHAUNTIA Ramos, DOB Oct 02, 1955, MRN [id number]  PCP:  Nicoletta Dress, MD  Cardiologist:  Jenean Lindau, MD   Referring MD: Nicoletta Dress, MD    ASSESSMENT:    1. Atherosclerotic vascular disease   2. Chronic combined systolic and diastolic congestive heart failure ([place])   3. Essential hypertension   4. Paroxysmal atrial fibrillation (HCC)   5. Type 2 diabetes mellitus without complication, without long-term current use of insulin ([place])   6. Dyslipidemia   7. Morbid obesity ([place])   8. Chest pain, unspecified type   9. Screening for thyroid disorder    PLAN:    In order of problems listed above:  1. Secondary prevention stressed with the patient.  Importance of compliance with diet and medications stressed and she vocalized understanding.  Sublingual nitroglycerin prescription was sent, its protocol and 911 protocol explained and the patient vocalized understanding questions were answered to the patient's satisfaction 2. The patient's chest pain symptoms have mixed features.  They do not occur on exertion.  In the other hand it concerns me that she has multiple risk factors for coronary artery disease and has features suggestive of angina.  In view of this I reviewed her previous records.  During the second part of last year, she has had a stress test which was unremarkable.  Therefore we will set her up for a coronary CT angiography.  Conventional CT angiography was discussed and she is not keen on it at this time and I respect her wishes. 3. Diet was discussed for dyslipidemia and diabetes mellitus.  Risks of obesity explained and she vocalized understanding. 4. I discussed with the patient atrial fibrillation, disease process. Management and therapy including rate and rhythm control, anticoagulation benefits and potential risks were discussed extensively with the patient. Patient had multiple questions which were answered  to patient's satisfaction. 5. Clinically currently she is in sinus rhythm.   Medication Adjustments/Labs and Tests Ordered: Current medicines are reviewed at length with the patient today.  Concerns regarding medicines are outlined above.  Orders Placed This Encounter  Procedures  . CT CORONARY MORPH W/CTA COR W/SCORE W/CA W/CM &/OR WO/CM  . CT CORONARY FRACTIONAL FLOW RESERVE DATA PREP  . CT CORONARY FRACTIONAL FLOW RESERVE FLUID ANALYSIS  . Basic metabolic panel  . CBC with Differential/Platelet  . TSH  . Hepatic function panel  . Lipid panel  . PT and PTT   Meds ordered this encounter  Medications  . nitroGLYCERIN (NITROSTAT) 0.4 MG SL tablet    Sig: Place 1 tablet (0.4 mg total) under the tongue every 5 (five) minutes as needed.    Dispense:  11 tablet    Refill:  6     Chief Complaint  Patient presents with  . Follow-up     History of Present Illness:    Crystal Ramos is a 62 y.o. female.  The patient has paroxysmal atrial fibrillation, essential hypertension.  She ambulates on a regular basis and matter of fact she walks on a regular basis to 3-4 days a week.  She mentions to me that occasionally she has substernal chest tightness radiating to the left arm.  This occurs at rest.  No orthopnea or PND.  At the time of my evaluation, the patient is alert awake oriented and in no distress.  She denies any radiation to the neck.  She denies this occurring on exertion.  Past Medical History:  Diagnosis Date  . Acute diastolic heart failure ([place])   . Atrial fibrillation ([place])   . Chest pain   . Essential hypertension     Past Surgical History:  Procedure Laterality Date  . CHOLECYSTECTOMY      Current Medications: Current Meds  Medication Sig  . amiodarone (PACERONE) 200 MG tablet Take 2 tablets (400 mg total) daily by mouth. Take 400mg  once a day for 1 week then change to 200 mg daily  . atorvastatin (LIPITOR) 40 MG tablet Take 40 mg by mouth daily.  Marland Kitchen  glimepiride (AMARYL) 4 MG tablet Take 4 mg by mouth.  . linagliptin (TRADJENTA) 5 MG TABS tablet Take 5 mg by mouth.  Marland Kitchen lisinopril (PRINIVIL,ZESTRIL) 40 MG tablet Take 40 mg by mouth daily.  . metFORMIN (GLUCOPHAGE) 850 MG tablet Take 850 mg by mouth 3 (three) times daily.  . metoprolol succinate (TOPROL-XL) 50 MG 24 hr tablet Take 50 mg by mouth daily. Take with or immediately following a meal.  . metoprolol tartrate (LOPRESSOR) 50 MG tablet Take 50 mg by mouth.  . rivaroxaban (XARELTO) 20 MG TABS tablet Take 20 mg by mouth daily with supper.     Allergies:   Patient has no known allergies.   Social History   Socioeconomic History  . Marital status: Married    Spouse name: None  . Number of children: None  . Years of education: None  . Highest education level: None  Social Needs  . Financial resource strain: None  . Food insecurity - worry: None  . Food insecurity - inability: None  . Transportation needs - medical: None  . Transportation needs - non-medical: None  Occupational History  . None  Tobacco Use  . Smoking status: Passive Smoke Exposure - Never Smoker  . Smokeless tobacco: Never Used  . Tobacco comment: "whole family" smoked in the house  Substance and Sexual Activity  . Alcohol use: No  . Drug use: No  . Sexual activity: None  Other Topics Concern  . None  Social History Narrative  . None     Family History: The patient's family history includes Breast cancer in her paternal aunt; Diabetes in her mother; Stroke in her father and paternal aunt.  ROS:   Please see the history of present illness.    All other systems reviewed and are negative.  EKGs/Labs/Other Studies Reviewed:    The following studies were reviewed today: Reviewed previous evaluations and patient is fasting and needs blood work to this.   Recent Labs: 04/12/2017: BNP 62.3; Hemoglobin 14.4; Platelets 178 05/03/2017: BUN 24; Creatinine, Ser 1.01; Potassium 4.3; Sodium 141  Recent  Lipid Panel No results found for: CHOL, TRIG, HDL, CHOLHDL, VLDL, LDLCALC, LDLDIRECT  Physical Exam:    VS:  BP 114/70 (BP Location: Left Arm, Patient Position: Sitting, Cuff Size: Large)   Pulse 68   Ht 5\' 1"  (1.549 m)   Wt 214 lb (97.1 kg)   SpO2 98%   BMI 40.43 kg/m     Wt Readings from Last 3 Encounters:  06/07/17 214 lb (97.1 kg)  05/03/17 212 lb 1.3 oz (96.2 kg)  04/12/17 209 lb (94.8 kg)     GEN: Patient is in no acute distress HEENT: Normal NECK: No JVD; No carotid bruits LYMPHATICS: No lymphadenopathy CARDIAC: Hear sounds regular, 2/6 systolic murmur at the apex. RESPIRATORY:  Clear to auscultation without rales, wheezing or rhonchi  ABDOMEN: Soft, non-tender, non-distended MUSCULOSKELETAL:  No edema;  No deformity  SKIN: Warm and dry NEUROLOGIC:  Alert and oriented x 3 PSYCHIATRIC:  Normal affect   Signed, Jenean Lindau, MD  06/07/2017 8:59 AM    [facility]

## 2017-06-08 ENCOUNTER — Encounter: Payer: Self-pay | Admitting: *Deleted

## 2017-06-08 LAB — HEPATIC FUNCTION PANEL
ALBUMIN: 4.3 g/dL (ref 3.6–4.8)
ALT: 47 IU/L — ABNORMAL HIGH (ref 0–32)
AST: 40 IU/L (ref 0–40)
Alkaline Phosphatase: 111 IU/L (ref 39–117)
Bilirubin Total: 0.4 mg/dL (ref 0.0–1.2)
Bilirubin, Direct: 0.15 mg/dL (ref 0.00–0.40)
TOTAL PROTEIN: 7.4 g/dL (ref 6.0–8.5)

## 2017-06-08 LAB — BASIC METABOLIC PANEL
BUN/Creatinine Ratio: 14 (ref 12–28)
BUN: 10 mg/dL (ref 8–27)
CHLORIDE: 101 mmol/L (ref 96–106)
CO2: 21 mmol/L (ref 20–29)
Calcium: 9.3 mg/dL (ref 8.7–10.3)
Creatinine, Ser: 0.74 mg/dL (ref 0.57–1.00)
GFR calc Af Amer: 100 mL/min/{1.73_m2} (ref >59)
GFR calc non Af Amer: 86 mL/min/{1.73_m2} (ref >59)
Glucose: 145 mg/dL — ABNORMAL HIGH (ref 65–99)
Potassium: 4.4 mmol/L (ref 3.5–5.2)
SODIUM: 139 mmol/L (ref 134–144)

## 2017-06-08 LAB — CBC WITH DIFFERENTIAL/PLATELET
BASOS ABS: 0 10*3/uL (ref 0.0–0.2)
Basos: 1 %
EOS (ABSOLUTE): 0.1 10*3/uL (ref 0.0–0.4)
EOS: 2 %
Hematocrit: 41.8 % (ref 34.0–46.6)
Hemoglobin: 14.3 g/dL (ref 11.1–15.9)
IMMATURE GRANULOCYTES: 0 %
Immature Grans (Abs): 0 10*3/uL (ref 0.0–0.1)
Lymphocytes Absolute: 1.8 10*3/uL (ref 0.7–3.1)
Lymphs: 31 %
MCH: 28.6 pg (ref 26.6–33.0)
MCHC: 34.2 g/dL (ref 31.5–35.7)
MCV: 84 fL (ref 79–97)
MONOS ABS: 0.3 10*3/uL (ref 0.1–0.9)
Monocytes: 6 %
NEUTROS PCT: 60 %
Neutrophils Absolute: 3.5 10*3/uL (ref 1.4–7.0)
PLATELETS: 140 10*3/uL — AB (ref 150–379)
RBC: 5 x10E6/uL (ref 3.77–5.28)
RDW: 15.7 % — AB (ref 12.3–15.4)
WBC: 5.7 10*3/uL (ref 3.4–10.8)

## 2017-06-08 LAB — LIPID PANEL
Chol/HDL Ratio: 2.8 ratio (ref 0.0–4.4)
Cholesterol, Total: 168 mg/dL (ref 100–199)
HDL: 60 mg/dL (ref >39)
LDL Calculated: 87 mg/dL (ref 0–99)
TRIGLYCERIDES: 107 mg/dL (ref 0–149)
VLDL Cholesterol Cal: 21 mg/dL (ref 5–40)

## 2017-06-08 LAB — TSH: TSH: 5.6 u[IU]/mL — AB (ref 0.450–4.500)

## 2017-06-08 LAB — PT AND PTT
INR: 1.5 — ABNORMAL HIGH (ref 0.8–1.2)
Prothrombin Time: 14.8 s — ABNORMAL HIGH (ref 9.1–12.0)
aPTT: 44 s — ABNORMAL HIGH (ref 24–33)

## 2017-06-09 NOTE — Addendum Note (Signed)
Addended by: Mattie Marlin on: 06/09/2017 10:30 AM   Modules accepted: Orders

## 2017-06-10 ENCOUNTER — Encounter: Payer: Self-pay | Admitting: Pulmonary Disease

## 2017-06-14 ENCOUNTER — Ambulatory Visit (INDEPENDENT_AMBULATORY_CARE_PROVIDER_SITE_OTHER): Payer: PPO | Admitting: Pulmonary Disease

## 2017-06-14 ENCOUNTER — Encounter: Payer: Self-pay | Admitting: Pulmonary Disease

## 2017-06-14 ENCOUNTER — Ambulatory Visit: Payer: Medicare Other | Admitting: Pulmonary Disease

## 2017-06-14 VITALS — BP 125/78 | HR 67 | Ht 61.0 in | Wt 215.0 lb

## 2017-06-14 DIAGNOSIS — R059 Cough, unspecified: Secondary | ICD-10-CM

## 2017-06-14 DIAGNOSIS — R05 Cough: Secondary | ICD-10-CM | POA: Diagnosis not present

## 2017-06-14 DIAGNOSIS — K219 Gastro-esophageal reflux disease without esophagitis: Secondary | ICD-10-CM

## 2017-06-14 DIAGNOSIS — J849 Interstitial pulmonary disease, unspecified: Secondary | ICD-10-CM

## 2017-06-14 DIAGNOSIS — I480 Paroxysmal atrial fibrillation: Secondary | ICD-10-CM

## 2017-06-14 NOTE — Progress Notes (Signed)
Subjective:   PATIENT ID: Crystal Ramos GENDER: female DOB: 1956-01-02, MRN: [id number]  Synopsis: Referred in November 2018 for Dyspnea. She has a history of Afib and has been on amiodarone since 01/2017.    HPI  Chief Complaint  Patient presents with  . Follow-up    pt notes dyspnea but states it's improved since last office visit.    Crystal Ramos says she feels really good right now. She says that the cardioversion helped a lot.  She says that she coughs a lot.  It is worse when she is eating, like she is getting choked.  She doesn't feel like she is congested, no sputum production. She had her cardioversion in December and she feels much better now. She says that her symptoms are better now. She really feels better.  Past Medical History:  Diagnosis Date  . Acute diastolic heart failure ([place])   . Atrial fibrillation ([place])   . Chest pain   . Essential hypertension      Review of Systems  Constitutional: Negative for fever and weight loss.  HENT: Negative for congestion, ear pain, nosebleeds and sore throat.   Eyes: Negative for redness.  Respiratory: Positive for shortness of breath. Negative for cough and wheezing.   Cardiovascular: Negative for palpitations, leg swelling and PND.  Gastrointestinal: Negative for nausea and vomiting.  Genitourinary: Negative for dysuria.  Skin: Negative for rash.  Neurological: Negative for headaches.  Endo/Heme/Allergies: Does not bruise/bleed easily.  Psychiatric/Behavioral: Negative for depression. The patient is not nervous/anxious.       Objective:  Physical Exam   Vitals:   06/14/17 1524  BP: 125/78  Pulse: 67  SpO2: 100%  Weight: 215 lb (97.5 kg)  Height: 5\' 1"  (1.549 m)   RA  Gen: well appearing HENT: OP clear, TM's clear, neck supple PULM: CTA B, normal percussion CV: RRR, slight systolic murmur RUSB, trace edema GI: BS+, soft, nontender Derm: no cyanosis or rash Psyche: normal mood and affect    CBC   Component Value Date/Time   WBC 5.7 06/07/2017 0912   RBC 5.00 06/07/2017 0912   HGB 14.3 06/07/2017 0912   HCT 41.8 06/07/2017 0912   PLT 140 (L) 06/07/2017 0912   MCV 84 06/07/2017 0912   MCH 28.6 06/07/2017 0912   MCHC 34.2 06/07/2017 0912   RDW 15.7 (H) 06/07/2017 0912   LYMPHSABS 1.8 06/07/2017 0912   EOSABS 0.1 06/07/2017 0912   BASOSABS 0.0 06/07/2017 0912     Chest imaging: 01/2017 CT chest> images independently reviewed showing intralobular septal thickening throughout, bilateral small pleural effusions, patchy groundglass and consolidation worse in the bases.  PFT: 03/2017 Ratio 82%, FVC 1.49  52% pred, TLC 2.91 L 63% pred, DLCO 13.43 66% pred  Labs:  Path:  Echo:  Heart Catheterization:  Records from her visit with cardiology last week reviewed, she was cared for her Afib and Hypertension. She takes amiodarone.     Assessment & Plan:   Cough  Interstitial pulmonary disease ([place]) - Plan: CT Chest High Resolution  Gastroesophageal reflux disease, esophagitis presence not specified  Paroxysmal atrial fibrillation [facility])  Discussion: Ms. Crystal Ramos feels much better since her cardioversion.  Her chest x-ray in November when we saw her was clear without evidence of underlying lung disease.  The most recent CT scan did show some intralobular septal thickening which I think was most likely due to volume overload.  Her pulmonary function test also showed restrictive lung disease with moderate  diffusion limitation.  I really do not think she has underlying lung disease, though I think it is best to go ahead and get a high-resolution CT scan of the chest because of the minor abnormality seen on her lung function testing and the fact that she still has a cough and takes amiodarone.  I am pretty sure a high-resolution CT scan is going to show no evidence of underlying inflammatory lung disease.  I believe that her cough is either due to acid reflux (most likely based on  clinical history) or lisinopril.  Plan: Cough: Most likely due to gastroesophageal reflux disease: Avoid fatty foods, chocolate, mints, alcohol, caffeine Avoid eating anything within 3 hours of bedtime If your symptoms do not improve then try taking Pepcid over-the-counter twice a day If that does not help then talk to your primary care doctor about changing from lisinopril to another blood pressure medicine  Abnormal chest x-ray/amiodarone use: I think it is okay to go ahead and continue taking amiodarone We will get a high-resolution CT scan of the chest to make sure there is no evidence of underlying lung disease, I doubt there will be  Acute respiratory failure with hypoxemia: This problem has resolved, it was due to you having too much fluid in your lungs several months ago We will send an order to your durable medical equipment company to take the oxygen  We will see you back on an as-needed basis     Current Outpatient Medications:  .  amiodarone (PACERONE) 200 MG tablet, Take 200 mg by mouth daily., Disp: , Rfl:  .  atorvastatin (LIPITOR) 40 MG tablet, Take 40 mg by mouth daily., Disp: , Rfl:  .  glimepiride (AMARYL) 4 MG tablet, Take 4 mg by mouth., Disp: , Rfl:  .  linagliptin (TRADJENTA) 5 MG TABS tablet, Take 5 mg by mouth., Disp: , Rfl:  .  lisinopril (PRINIVIL,ZESTRIL) 40 MG tablet, Take 40 mg by mouth daily., Disp: , Rfl:  .  metFORMIN (GLUCOPHAGE) 850 MG tablet, Take 850 mg by mouth 3 (three) times daily., Disp: , Rfl:  .  metoprolol succinate (TOPROL-XL) 50 MG 24 hr tablet, Take 50 mg by mouth daily. Take with or immediately following a meal., Disp: , Rfl:  .  nitroGLYCERIN (NITROSTAT) 0.4 MG SL tablet, Place 1 tablet (0.4 mg total) under the tongue every 5 (five) minutes as needed., Disp: 11 tablet, Rfl: 6 .  rivaroxaban (XARELTO) 20 MG TABS tablet, Take 20 mg by mouth daily with supper., Disp: , Rfl:

## 2017-06-14 NOTE — Patient Instructions (Signed)
Cough: Most likely due to gastroesophageal reflux disease: Avoid fatty foods, chocolate, mints, alcohol, caffeine Avoid eating anything within 3 hours of bedtime If your symptoms do not improve then try taking Pepcid over-the-counter twice a day If that does not help then talk to your primary care doctor about changing from lisinopril to another blood pressure medicine  Abnormal chest x-ray/amiodarone use: I think it is okay to go ahead and continue taking amiodarone We will get a high-resolution CT scan of the chest to make sure there is no evidence of underlying lung disease, I doubt there will be  Acute respiratory failure with hypoxemia: This problem has resolved, it was due to you having too much fluid in your lungs several months ago We will send an order to your durable medical equipment company to take the oxygen  We will see you back on an as-needed basis

## 2017-06-21 ENCOUNTER — Encounter: Payer: Self-pay | Admitting: Cardiology

## 2017-06-24 DIAGNOSIS — L4050 Arthropathic psoriasis, unspecified: Secondary | ICD-10-CM | POA: Diagnosis not present

## 2017-06-24 DIAGNOSIS — L400 Psoriasis vulgaris: Secondary | ICD-10-CM | POA: Diagnosis not present

## 2017-06-25 ENCOUNTER — Encounter: Payer: Self-pay | Admitting: Cardiology

## 2017-06-25 ENCOUNTER — Ambulatory Visit (INDEPENDENT_AMBULATORY_CARE_PROVIDER_SITE_OTHER): Payer: PPO | Admitting: Cardiology

## 2017-06-25 VITALS — BP 124/72 | HR 68 | Ht 61.0 in | Wt 216.0 lb

## 2017-06-25 DIAGNOSIS — I10 Essential (primary) hypertension: Secondary | ICD-10-CM | POA: Diagnosis not present

## 2017-06-25 DIAGNOSIS — E119 Type 2 diabetes mellitus without complications: Secondary | ICD-10-CM | POA: Diagnosis not present

## 2017-06-25 DIAGNOSIS — I480 Paroxysmal atrial fibrillation: Secondary | ICD-10-CM

## 2017-06-25 DIAGNOSIS — I7090 Unspecified atherosclerosis: Secondary | ICD-10-CM

## 2017-06-25 DIAGNOSIS — E785 Hyperlipidemia, unspecified: Secondary | ICD-10-CM

## 2017-06-25 NOTE — Progress Notes (Signed)
Cardiology Office Note:    Date:  06/25/2017   ID:  Crystal Ramos, DOB 05-23-1955, MRN [id number]  PCP:  Nicoletta Dress, MD  Cardiologist:  Jenean Lindau, MD   Referring MD: Nicoletta Dress, MD    ASSESSMENT:    1. Atherosclerotic vascular disease   2. Essential hypertension   3. Type 2 diabetes mellitus without complication, without long-term current use of insulin (HCC)   4. Paroxysmal atrial fibrillation ([place])   5. Dyslipidemia   6. Morbid obesity ([place])    PLAN:    In order of problems listed above:  1. Primary prevention stressed with the patient.  Importance of compliance with diet and medications stressed and she vocalized understanding.  She mentions to me that she has lost weight in the past 1-2 months and I told her to keep continuing with that good plan.  She has a good exercise program and she will continue this. 2. Atrial fibrillation is stable anticoagulation issues were discussed.  Her LFT was mildly elevated.  Cut down her amiodarone to 100 mg daily.  She will be back in 1 month for follow-up at which time she will have a liver panel check also.   Medication Adjustments/Labs and Tests Ordered: Current medicines are reviewed at length with the patient today.  Concerns regarding medicines are outlined above.  No orders of the defined types were placed in this encounter.  No orders of the defined types were placed in this encounter.    Chief Complaint  Patient presents with  . Follow-up    abnormal labs.     History of Present Illness:    Crystal Ramos is a 62 y.o. female.  She has paroxysmal atrial fibrillation, essential hypertension, diabetes mellitus and dyslipidemia.  She is doing remarkably well as far as is approximately fibrillation is controlled.  She is dieting well and exercising on a regular basis.  Denies any chest pain orthopnea or PND.  She is walking at least half an hour a day without any symptoms.  At the time of my evaluation, the  patient is alert awake oriented and in no distress.  Past Medical History:  Diagnosis Date  . Acute diastolic heart failure ([place])   . Atrial fibrillation ([place])   . Chest pain   . Essential hypertension     Past Surgical History:  Procedure Laterality Date  . CHOLECYSTECTOMY      Current Medications: Current Meds  Medication Sig  . amiodarone (PACERONE) 200 MG tablet Take 100 mg by mouth daily.  Marland Kitchen atorvastatin (LIPITOR) 40 MG tablet Take 40 mg by mouth daily.  Marland Kitchen glimepiride (AMARYL) 4 MG tablet Take 4 mg by mouth.  . linagliptin (TRADJENTA) 5 MG TABS tablet Take 5 mg by mouth.  Marland Kitchen lisinopril (PRINIVIL,ZESTRIL) 40 MG tablet Take 40 mg by mouth daily.  . metFORMIN (GLUCOPHAGE) 850 MG tablet Take 850 mg by mouth 3 (three) times daily.  . metoprolol succinate (TOPROL-XL) 50 MG 24 hr tablet Take 50 mg by mouth daily. Take with or immediately following a meal.  . nitroGLYCERIN (NITROSTAT) 0.4 MG SL tablet Place 1 tablet (0.4 mg total) under the tongue every 5 (five) minutes as needed.  . rivaroxaban (XARELTO) 20 MG TABS tablet Take 20 mg by mouth daily with supper.     Allergies:   Patient has no known allergies.   Social History   Socioeconomic History  . Marital status: Married    Spouse name: None  .  Number of children: None  . Years of education: None  . Highest education level: None  Social Needs  . Financial resource strain: None  . Food insecurity - worry: None  . Food insecurity - inability: None  . Transportation needs - medical: None  . Transportation needs - non-medical: None  Occupational History  . None  Tobacco Use  . Smoking status: Passive Smoke Exposure - Never Smoker  . Smokeless tobacco: Never Used  . Tobacco comment: "whole family" smoked in the house  Substance and Sexual Activity  . Alcohol use: No  . Drug use: No  . Sexual activity: None  Other Topics Concern  . None  Social History Narrative  . None     Family History: The patient's family  history includes Breast cancer in her paternal aunt; Diabetes in her mother; Stroke in her father and paternal aunt.  ROS:   Please see the history of present illness.    All other systems reviewed and are negative.  EKGs/Labs/Other Studies Reviewed:    The following studies were reviewed today: I reviewed her records with her extensively.    Recent Labs: 04/12/2017: BNP 62.3 06/07/2017: ALT 47; BUN 10; Creatinine, Ser 0.74; Hemoglobin 14.3; Platelets 140; Potassium 4.4; Sodium 139; TSH 5.600  Recent Lipid Panel    Component Value Date/Time   CHOL 168 06/07/2017 0912   TRIG 107 06/07/2017 0912   HDL 60 06/07/2017 0912   CHOLHDL 2.8 06/07/2017 0912   LDLCALC 87 06/07/2017 0912    Physical Exam:    VS:  BP 124/72 (BP Location: Left Arm, Patient Position: Sitting, Cuff Size: Large)   Pulse 68   Ht 5\' 1"  (1.549 m)   Wt 216 lb (98 kg)   SpO2 98%   BMI 40.81 kg/m     Wt Readings from Last 3 Encounters:  06/25/17 216 lb (98 kg)  06/14/17 215 lb (97.5 kg)  06/07/17 214 lb (97.1 kg)     GEN: Patient is in no acute distress HEENT: Normal NECK: No JVD; No carotid bruits LYMPHATICS: No lymphadenopathy CARDIAC: Hear sounds regular, 2/6 systolic murmur at the apex. RESPIRATORY:  Clear to auscultation without rales, wheezing or rhonchi  ABDOMEN: Soft, non-tender, non-distended MUSCULOSKELETAL:  No edema; No deformity  SKIN: Warm and dry NEUROLOGIC:  Alert and oriented x 3 PSYCHIATRIC:  Normal affect   Signed, Jenean Lindau, MD  06/25/2017 11:05 AM    [place]

## 2017-06-25 NOTE — Patient Instructions (Signed)
Medication Instructions:  Your physician has recommended you make the following change in your medication:  CHANGE amiodarone to 100 mg daily  Labwork: None  Testing/Procedures: None  Follow-Up: Your physician recommends that you schedule a follow-up appointment in: 1 month  Any Other Special Instructions Will Be Listed Below (If Applicable).     If you need a refill on your cardiac medications before your next appointment, please call your pharmacy.   [place], RN, BSN

## 2017-06-28 ENCOUNTER — Ambulatory Visit (HOSPITAL_BASED_OUTPATIENT_CLINIC_OR_DEPARTMENT_OTHER)
Admission: RE | Admit: 2017-06-28 | Discharge: 2017-06-28 | Disposition: A | Discharge status: Home / Self Care | Payer: PPO | Source: Ambulatory Visit | Attending: Pulmonary Disease | Admitting: Pulmonary Disease

## 2017-06-28 DIAGNOSIS — I700 Atherosclerosis of aorta: Secondary | ICD-10-CM | POA: Insufficient documentation

## 2017-06-28 DIAGNOSIS — J849 Interstitial pulmonary disease, unspecified: Secondary | ICD-10-CM

## 2017-06-28 DIAGNOSIS — R918 Other nonspecific abnormal finding of lung field: Secondary | ICD-10-CM | POA: Insufficient documentation

## 2017-06-28 DIAGNOSIS — I2510 Atherosclerotic heart disease of native coronary artery without angina pectoris: Secondary | ICD-10-CM | POA: Diagnosis not present

## 2017-06-28 DIAGNOSIS — J181 Lobar pneumonia, unspecified organism: Secondary | ICD-10-CM | POA: Diagnosis not present

## 2017-06-30 DIAGNOSIS — R05 Cough: Secondary | ICD-10-CM | POA: Diagnosis not present

## 2017-06-30 DIAGNOSIS — E1165 Type 2 diabetes mellitus with hyperglycemia: Secondary | ICD-10-CM | POA: Diagnosis not present

## 2017-06-30 DIAGNOSIS — Z1231 Encounter for screening mammogram for malignant neoplasm of breast: Secondary | ICD-10-CM | POA: Diagnosis not present

## 2017-06-30 DIAGNOSIS — I10 Essential (primary) hypertension: Secondary | ICD-10-CM | POA: Diagnosis not present

## 2017-06-30 DIAGNOSIS — Z1211 Encounter for screening for malignant neoplasm of colon: Secondary | ICD-10-CM | POA: Diagnosis not present

## 2017-06-30 DIAGNOSIS — I480 Paroxysmal atrial fibrillation: Secondary | ICD-10-CM | POA: Diagnosis not present

## 2017-06-30 DIAGNOSIS — Z79899 Other long term (current) drug therapy: Secondary | ICD-10-CM | POA: Diagnosis not present

## 2017-06-30 DIAGNOSIS — Z1331 Encounter for screening for depression: Secondary | ICD-10-CM | POA: Diagnosis not present

## 2017-06-30 DIAGNOSIS — Z6839 Body mass index (BMI) 39.0-39.9, adult: Secondary | ICD-10-CM | POA: Diagnosis not present

## 2017-06-30 DIAGNOSIS — E785 Hyperlipidemia, unspecified: Secondary | ICD-10-CM | POA: Diagnosis not present

## 2017-07-02 ENCOUNTER — Other Ambulatory Visit: Payer: Self-pay

## 2017-07-02 DIAGNOSIS — R0789 Other chest pain: Secondary | ICD-10-CM

## 2017-07-02 DIAGNOSIS — I7090 Unspecified atherosclerosis: Secondary | ICD-10-CM

## 2017-07-05 ENCOUNTER — Telehealth: Payer: Self-pay | Admitting: Pulmonary Disease

## 2017-07-05 NOTE — Telephone Encounter (Signed)
Notes recorded by Juanito Doom, MD on 06/30/2017 at 9:14 AM EST A, Please let her know that the radiologist thinks she has pneumonia. Please ask if she has been feeling badly (cough, dyspnea, etc). She will need to be seen by someone to go over these results and likely be treated with an antibiotic. Thanks, B  Advised pt of results. Pt understood and nothing further is needed. She will call her PCP and let him know.

## 2017-07-06 DIAGNOSIS — J189 Pneumonia, unspecified organism: Secondary | ICD-10-CM | POA: Diagnosis not present

## 2017-07-06 DIAGNOSIS — R05 Cough: Secondary | ICD-10-CM | POA: Diagnosis not present

## 2017-07-22 ENCOUNTER — Ambulatory Visit (HOSPITAL_COMMUNITY): Payer: PPO

## 2017-07-23 ENCOUNTER — Ambulatory Visit: Payer: PPO | Admitting: Cardiology

## 2017-07-28 DIAGNOSIS — J189 Pneumonia, unspecified organism: Secondary | ICD-10-CM | POA: Diagnosis not present

## 2017-07-29 DIAGNOSIS — E039 Hypothyroidism, unspecified: Secondary | ICD-10-CM | POA: Diagnosis not present

## 2017-08-02 ENCOUNTER — Ambulatory Visit (INDEPENDENT_AMBULATORY_CARE_PROVIDER_SITE_OTHER): Payer: PPO | Admitting: Cardiology

## 2017-08-02 ENCOUNTER — Encounter: Payer: Self-pay | Admitting: Cardiology

## 2017-08-02 VITALS — BP 132/84 | HR 57 | Ht 61.0 in | Wt 214.0 lb

## 2017-08-02 DIAGNOSIS — E119 Type 2 diabetes mellitus without complications: Secondary | ICD-10-CM | POA: Diagnosis not present

## 2017-08-02 DIAGNOSIS — I480 Paroxysmal atrial fibrillation: Secondary | ICD-10-CM

## 2017-08-02 DIAGNOSIS — I10 Essential (primary) hypertension: Secondary | ICD-10-CM | POA: Diagnosis not present

## 2017-08-02 DIAGNOSIS — I7090 Unspecified atherosclerosis: Secondary | ICD-10-CM

## 2017-08-02 DIAGNOSIS — E785 Hyperlipidemia, unspecified: Secondary | ICD-10-CM

## 2017-08-02 NOTE — Patient Instructions (Signed)
Medication Instructions:  Your physician recommends that you continue on your current medications as directed. Please refer to the Current Medication list given to you today.  Labwork: Your physician recommends that you have the following labs drawn: liver function  Testing/Procedures: None  Follow-Up: Your physician recommends that you schedule a follow-up appointment in: 6 months  Any Other Special Instructions Will Be Listed Below (If Applicable).     If you need a refill on your cardiac medications before your next appointment, please call your pharmacy.   [place], RN, BSN

## 2017-08-02 NOTE — Progress Notes (Signed)
Cardiology Office Note:    Date:  08/02/2017   ID:  Crystal Ramos, DOB 10-Apr-1956, MRN [id number]  PCP:  Nicoletta Dress, MD  Cardiologist:  Jenean Lindau, MD   Referring MD: Nicoletta Dress, MD    ASSESSMENT:    1. Paroxysmal atrial fibrillation (HCC)   2. Atherosclerotic vascular disease   3. Essential hypertension   4. Type 2 diabetes mellitus without complication, without long-term current use of insulin ([place])   5. Dyslipidemia   6. Morbid obesity ([place])    PLAN:    In order of problems listed above:  1. I discussed with the patient atrial fibrillation, disease process. Management and therapy including rate and rhythm control, anticoagulation benefits and potential risks were discussed extensively with the patient. Patient had multiple questions which were answered to patient's satisfaction. 2. Secondary prevention stressed with the patient.  Importance of compliance with diet and medications stressed and she will Understanding.  Blood pressure stable.  Diet was discussed with dyslipidemia and diabetes mellitus.  There is some obesity explained and she vocalized understanding and plans to be aggressive about losing weight. 3. Patient will have LFTs done today.  Patient will be seen in follow-up appointment in 6 months or earlier if the patient has any concerns    Medication Adjustments/Labs and Tests Ordered: Current medicines are reviewed at length with the patient today.  Concerns regarding medicines are outlined above.  No orders of the defined types were placed in this encounter.  No orders of the defined types were placed in this encounter.    Chief Complaint  Patient presents with  . Follow-up  . Atherosclerotic Vascular Disease     History of Present Illness:    Crystal Ramos is a 62 y.o. female.  The patient has nonobstructive coronary artery disease, approximately fibrillation, essential hypertension, dyslipidemia, diabetes mellitus and morbid  obesity.  She denies any problems at this time.  She has not felt any palpitations or any chest pain.  She now begins and is walking 45 minutes on a regular basis without any symptoms.  She is very happy about it.  She is trying to lose weight.  At the time of my evaluation, the patient is alert awake oriented and in no distress.  Past Medical History:  Diagnosis Date  . Acute diastolic heart failure ([place])   . Atrial fibrillation ([place])   . Chest pain   . Essential hypertension     Past Surgical History:  Procedure Laterality Date  . CHOLECYSTECTOMY      Current Medications: Current Meds  Medication Sig  . amiodarone (PACERONE) 200 MG tablet Take 100 mg by mouth daily.  Marland Kitchen atorvastatin (LIPITOR) 40 MG tablet Take 40 mg by mouth daily.  Marland Kitchen glimepiride (AMARYL) 4 MG tablet Take 4 mg by mouth.  . linagliptin (TRADJENTA) 5 MG TABS tablet Take 5 mg by mouth.  Marland Kitchen lisinopril (PRINIVIL,ZESTRIL) 40 MG tablet Take 40 mg by mouth daily.  . metFORMIN (GLUCOPHAGE) 850 MG tablet Take 850 mg by mouth 3 (three) times daily.  . metoprolol succinate (TOPROL-XL) 50 MG 24 hr tablet Take 50 mg by mouth daily. Take with or immediately following a meal.  . nitroGLYCERIN (NITROSTAT) 0.4 MG SL tablet Place 1 tablet (0.4 mg total) under the tongue every 5 (five) minutes as needed.  . rivaroxaban (XARELTO) 20 MG TABS tablet Take 20 mg by mouth daily with supper.     Allergies:   Patient has no known  allergies.   Social History   Socioeconomic History  . Marital status: Married    Spouse name: None  . Number of children: None  . Years of education: None  . Highest education level: None  Social Needs  . Financial resource strain: None  . Food insecurity - worry: None  . Food insecurity - inability: None  . Transportation needs - medical: None  . Transportation needs - non-medical: None  Occupational History  . None  Tobacco Use  . Smoking status: Passive Smoke Exposure - Never Smoker  . Smokeless  tobacco: Never Used  . Tobacco comment: "whole family" smoked in the house  Substance and Sexual Activity  . Alcohol use: No  . Drug use: No  . Sexual activity: None  Other Topics Concern  . None  Social History Narrative  . None     Family History: The patient's family history includes Breast cancer in her paternal aunt; Diabetes in her mother; Stroke in her father and paternal aunt.  ROS:   Please see the history of present illness.    All other systems reviewed and are negative.  EKGs/Labs/Other Studies Reviewed:    The following studies were reviewed today: I discussed my findings with the patient extensively N.   Recent Labs: 04/12/2017: BNP 62.3 06/07/2017: ALT 47; BUN 10; Creatinine, Ser 0.74; Hemoglobin 14.3; Platelets 140; Potassium 4.4; Sodium 139; TSH 5.600  Recent Lipid Panel    Component Value Date/Time   CHOL 168 06/07/2017 0912   TRIG 107 06/07/2017 0912   HDL 60 06/07/2017 0912   CHOLHDL 2.8 06/07/2017 0912   LDLCALC 87 06/07/2017 0912    Physical Exam:    VS:  BP 132/84 (BP Location: Right Arm, Patient Position: Sitting, Cuff Size: Normal)   Pulse (!) 57   Ht 5\' 1"  (1.549 m)   Wt 214 lb (97.1 kg)   SpO2 98%   BMI 40.43 kg/m     Wt Readings from Last 3 Encounters:  08/02/17 214 lb (97.1 kg)  06/25/17 216 lb (98 kg)  06/14/17 215 lb (97.5 kg)     GEN: Patient is in no acute distress HEENT: Normal NECK: No JVD; No carotid bruits LYMPHATICS: No lymphadenopathy CARDIAC: Hear sounds regular, 2/6 systolic murmur at the apex. RESPIRATORY:  Clear to auscultation without rales, wheezing or rhonchi  ABDOMEN: Soft, non-tender, non-distended MUSCULOSKELETAL:  No edema; No deformity  SKIN: Warm and dry NEUROLOGIC:  Alert and oriented x 3 PSYCHIATRIC:  Normal affect   Signed, Jenean Lindau, MD  08/02/2017 8:59 AM    [facility]

## 2017-08-03 LAB — HEPATIC FUNCTION PANEL
ALBUMIN: 4.2 g/dL (ref 3.6–4.8)
ALT: 39 IU/L — ABNORMAL HIGH (ref 0–32)
AST: 37 IU/L (ref 0–40)
Alkaline Phosphatase: 115 IU/L (ref 39–117)
Bilirubin Total: 0.5 mg/dL (ref 0.0–1.2)
Bilirubin, Direct: 0.15 mg/dL (ref 0.00–0.40)
TOTAL PROTEIN: 7.2 g/dL (ref 6.0–8.5)

## 2017-08-30 DIAGNOSIS — E039 Hypothyroidism, unspecified: Secondary | ICD-10-CM | POA: Diagnosis not present

## 2017-09-06 ENCOUNTER — Ambulatory Visit: Payer: PPO | Admitting: Cardiology

## 2017-09-20 DIAGNOSIS — M25511 Pain in right shoulder: Secondary | ICD-10-CM | POA: Diagnosis not present

## 2017-09-20 DIAGNOSIS — F411 Generalized anxiety disorder: Secondary | ICD-10-CM | POA: Diagnosis not present

## 2017-09-22 DIAGNOSIS — M25511 Pain in right shoulder: Secondary | ICD-10-CM | POA: Diagnosis not present

## 2017-09-23 DIAGNOSIS — L400 Psoriasis vulgaris: Secondary | ICD-10-CM | POA: Diagnosis not present

## 2017-10-20 DIAGNOSIS — Z6841 Body Mass Index (BMI) 40.0 and over, adult: Secondary | ICD-10-CM | POA: Diagnosis not present

## 2017-10-20 DIAGNOSIS — E039 Hypothyroidism, unspecified: Secondary | ICD-10-CM | POA: Diagnosis not present

## 2017-10-20 DIAGNOSIS — E785 Hyperlipidemia, unspecified: Secondary | ICD-10-CM | POA: Diagnosis not present

## 2017-10-20 DIAGNOSIS — Z1231 Encounter for screening mammogram for malignant neoplasm of breast: Secondary | ICD-10-CM | POA: Diagnosis not present

## 2017-10-20 DIAGNOSIS — I10 Essential (primary) hypertension: Secondary | ICD-10-CM | POA: Diagnosis not present

## 2017-10-20 DIAGNOSIS — E1165 Type 2 diabetes mellitus with hyperglycemia: Secondary | ICD-10-CM | POA: Diagnosis not present

## 2017-10-20 DIAGNOSIS — I480 Paroxysmal atrial fibrillation: Secondary | ICD-10-CM | POA: Diagnosis not present

## 2017-10-20 DIAGNOSIS — Z79899 Other long term (current) drug therapy: Secondary | ICD-10-CM | POA: Diagnosis not present

## 2017-10-26 ENCOUNTER — Telehealth: Payer: Self-pay

## 2017-10-26 NOTE — Telephone Encounter (Signed)
Called and talked to patients daughter who is on her HIPAA and understands to let her know to call me back

## 2017-10-26 NOTE — Telephone Encounter (Signed)
Patient called and discussed her results and plan and she  will call back with her cholesterol dosage

## 2017-11-29 ENCOUNTER — Ambulatory Visit (INDEPENDENT_AMBULATORY_CARE_PROVIDER_SITE_OTHER): Payer: PPO | Admitting: Cardiology

## 2017-11-29 ENCOUNTER — Encounter: Payer: Self-pay | Admitting: Cardiology

## 2017-11-29 VITALS — BP 150/80 | HR 65 | Ht 61.0 in | Wt 215.0 lb

## 2017-11-29 DIAGNOSIS — E119 Type 2 diabetes mellitus without complications: Secondary | ICD-10-CM

## 2017-11-29 DIAGNOSIS — I10 Essential (primary) hypertension: Secondary | ICD-10-CM | POA: Diagnosis not present

## 2017-11-29 DIAGNOSIS — I480 Paroxysmal atrial fibrillation: Secondary | ICD-10-CM

## 2017-11-29 DIAGNOSIS — R319 Hematuria, unspecified: Secondary | ICD-10-CM | POA: Insufficient documentation

## 2017-11-29 DIAGNOSIS — E785 Hyperlipidemia, unspecified: Secondary | ICD-10-CM | POA: Diagnosis not present

## 2017-11-29 HISTORY — DX: Hematuria, unspecified: R31.9

## 2017-11-29 NOTE — Progress Notes (Signed)
Cardiology Office Note:    Date:  11/29/2017   ID:  Crystal Ramos, DOB Sep 08, 1955, MRN [id number]  PCP:  Nicoletta Dress, MD  Cardiologist:  Jenean Lindau, MD   Referring MD: Nicoletta Dress, MD  Hematuria  ASSESSMENT:    1. Paroxysmal atrial fibrillation (HCC)   2. Essential hypertension   3. Type 2 diabetes mellitus without complication, without long-term current use of insulin ([place])   4. Dyslipidemia   5. Morbid obesity ([place])    PLAN:    In order of problems listed above:  1. The patient is having hematuria on a daily basis.  This is very significant.  In view of this I have asked the patient to stop taking Xarelto.  She will begin 325 mg of coated aspirin on a daily basis.  We will get in touch with her primary care physician to have her set up with urology ASAP.  I told the patient that the urologist will evaluate her and tell her when to start her Xarelto and stop her aspirin.  She vocalized understanding.  In view of the paroxysmal atrial fibrillation for the present time I will continue the amiodarone.  Benefits and potential risks were again revisited to her.  She will be seen in follow-up appointment in 2 months or earlier if she has any concerns.  She knows to go to the nearest emergency room for any significant concerns.   Medication Adjustments/Labs and Tests Ordered: Current medicines are reviewed at length with the patient today.  Concerns regarding medicines are outlined above.  No orders of the defined types were placed in this encounter.  No orders of the defined types were placed in this encounter.    Chief Complaint  Patient presents with  . Follow-up     History of Present Illness:    Crystal Ramos is a 62 y.o. female.  She has past medical history of essential hypertension, diabetes mellitus and obesity.  She has paroxysmal atrial fibrillation.  The patient has been having hematuria over the past on a daily basis.  And she tells me that it is  significant.  She discontinued her Xarelto.  No chest pain orthopnea PND or palpitations.  At the time of my evaluation, the patient is alert awake oriented and in no distress.  Past Medical History:  Diagnosis Date  . Acute diastolic heart failure ([place])   . Atrial fibrillation ([place])   . Chest pain   . Essential hypertension     Past Surgical History:  Procedure Laterality Date  . CHOLECYSTECTOMY      Current Medications: Current Meds  Medication Sig  . amiodarone (PACERONE) 200 MG tablet Take 100 mg by mouth daily.  Marland Kitchen atorvastatin (LIPITOR) 40 MG tablet Take 40 mg by mouth daily.  . empagliflozin (JARDIANCE) 25 MG TABS tablet Take 25 mg by mouth daily.  Marland Kitchen glimepiride (AMARYL) 4 MG tablet Take 4 mg by mouth.  . levothyroxine (SYNTHROID, LEVOTHROID) 75 MCG tablet Take 75 mcg by mouth daily.  Marland Kitchen linagliptin (TRADJENTA) 5 MG TABS tablet Take 5 mg by mouth.  Marland Kitchen lisinopril (PRINIVIL,ZESTRIL) 40 MG tablet Take 40 mg by mouth daily.  . metFORMIN (GLUCOPHAGE) 850 MG tablet Take 850 mg by mouth 3 (three) times daily.  . metoprolol succinate (TOPROL-XL) 50 MG 24 hr tablet Take 50 mg by mouth daily. Take with or immediately following a meal.  . nitroGLYCERIN (NITROSTAT) 0.4 MG SL tablet Place 1 tablet (0.4 mg total) under  the tongue every 5 (five) minutes as needed.  . rivaroxaban (XARELTO) 20 MG TABS tablet Take 20 mg by mouth daily with supper.  . SitaGLIPtin-MetFORMIN HCl (JANUMET XR) [phone] MG TB24 Take by mouth daily.     Allergies:   Patient has no known allergies.   Social History   Socioeconomic History  . Marital status: Married    Spouse name: Not on file  . Number of children: Not on file  . Years of education: Not on file  . Highest education level: Not on file  Occupational History  . Not on file  Social Needs  . Financial resource strain: Not on file  . Food insecurity:    Worry: Not on file    Inability: Not on file  . Transportation needs:    Medical: Not on  file    Non-medical: Not on file  Tobacco Use  . Smoking status: Passive Smoke Exposure - Never Smoker  . Smokeless tobacco: Never Used  . Tobacco comment: "whole family" smoked in the house  Substance and Sexual Activity  . Alcohol use: No  . Drug use: No  . Sexual activity: Not on file  Lifestyle  . Physical activity:    Days per week: Not on file    Minutes per session: Not on file  . Stress: Not on file  Relationships  . Social connections:    Talks on phone: Not on file    Gets together: Not on file    Attends religious service: Not on file    Active member of club or organization: Not on file    Attends meetings of clubs or organizations: Not on file    Relationship status: Not on file  Other Topics Concern  . Not on file  Social History Narrative  . Not on file     Family History: The patient's family history includes Breast cancer in her paternal aunt; Diabetes in her mother; Stroke in her father and paternal aunt.  ROS:   Please see the history of present illness.    All other systems reviewed and are negative.  EKGs/Labs/Other Studies Reviewed:    The following studies were reviewed today: EKG reveals sinus rhythm and nonspecific ST-T changes. next I discussed my findings with the patient at extensive length.  She has continued her amiodarone and Xarelto.  She is having significant hematuria.   Recent Labs: 04/12/2017: BNP 62.3 06/07/2017: BUN 10; Creatinine, Ser 0.74; Hemoglobin 14.3; Platelets 140; Potassium 4.4; Sodium 139; TSH 5.600 08/02/2017: ALT 39  Recent Lipid Panel    Component Value Date/Time   CHOL 168 06/07/2017 0912   TRIG 107 06/07/2017 0912   HDL 60 06/07/2017 0912   CHOLHDL 2.8 06/07/2017 0912   LDLCALC 87 06/07/2017 0912    Physical Exam:    VS:  BP (!) 150/80 (BP Location: Right Arm, Patient Position: Sitting, Cuff Size: Normal)   Pulse 65   Ht 5\' 1"  (1.549 m)   Wt 215 lb (97.5 kg)   SpO2 97%   BMI 40.62 kg/m     Wt Readings  from Last 3 Encounters:  11/29/17 215 lb (97.5 kg)  08/02/17 214 lb (97.1 kg)  06/25/17 216 lb (98 kg)     GEN: Patient is in no acute distress HEENT: Normal NECK: No JVD; No carotid bruits LYMPHATICS: No lymphadenopathy CARDIAC: Hear sounds regular, 2/6 systolic murmur at the apex. RESPIRATORY:  Clear to auscultation without rales, wheezing or rhonchi  ABDOMEN: Soft, non-tender, non-distended  MUSCULOSKELETAL:  No edema; No deformity  SKIN: Warm and dry NEUROLOGIC:  Alert and oriented x 3 PSYCHIATRIC:  Normal affect   Signed, Jenean Lindau, MD  11/29/2017 9:38 AM    [place]

## 2017-11-29 NOTE — Progress Notes (Signed)
Left voicemail for referral coordinator  [name] from Dr. Denton Lank office to refer patient to urology asap.  [name] should be in the office tomorrow according to voicemail.

## 2017-11-29 NOTE — Patient Instructions (Addendum)
Medication Instructions:  Your physician has recommended you make the following change in your medication:  STOP xarelto START 325 mg of enteric coated aspirin daily  Labwork: None  Testing/Procedures: None  Follow-Up: Your physician recommends that you schedule a follow-up appointment in: 2 months  Any Other Special Instructions Will Be Listed Below (If Applicable).  Please ensure that you are referred to a urologist by your PCP ASAP.   If you need a refill on your cardiac medications before your next appointment, please call your pharmacy.   [place], RN, BSN   Aspirin and Your Heart Aspirin is a medicine that affects the way blood clots. Aspirin can be used to help reduce the risk of blood clots, heart attacks, and other heart-related problems. Should I take aspirin? Your health care provider will help you determine whether it is safe and beneficial for you to take aspirin daily. Taking aspirin daily may be beneficial if you:  Have had a heart attack or chest pain.  Have undergone open heart surgery such as coronary artery bypass surgery (CABG).  Have had coronary angioplasty.  Have experienced a stroke or transient ischemic attack (TIA).  Have peripheral vascular disease (PVD).  Have chronic heart rhythm problems such as atrial fibrillation.  Are there any risks of taking aspirin daily? Daily use of aspirin can increase your risk of side effects. Some of these include:  Bleeding. Bleeding problems can be minor or serious. An example of a minor problem is a cut that does not stop bleeding. An example of a more serious problem is stomach bleeding or bleeding into the brain. Your risk of bleeding is increased if you are also taking non-steroidal anti-inflammatory medicine (NSAIDs).  Increased bruising.  Upset stomach.  An allergic reaction. People who have nasal polyps have an increased risk of developing an aspirin allergy.  What are some  guidelines I should follow when taking aspirin?  Take aspirin only as directed by your health care provider. Make sure you understand how much you should take and what form you should take. The two forms of aspirin are: ? Non-enteric-coated. This type of aspirin does not have a coating and is absorbed quickly. Non-enteric-coated aspirin is usually recommended for people with chest pain. This type of aspirin also comes in a chewable form. ? Enteric-coated. This type of aspirin has a special coating that releases the medicine very slowly. Enteric-coated aspirin causes less stomach upset than non-enteric-coated aspirin. This type of aspirin should not be chewed or crushed.  Drink alcohol in moderation. Drinking alcohol increases your risk of bleeding. When should I seek medical care?  You have unusual bleeding or bruising.  You have stomach pain.  You have an allergic reaction. Symptoms of an allergic reaction include: ? Hives. ? Itchy skin. ? Swelling of the lips, tongue, or face.  You have ringing in your ears. When should I seek immediate medical care?  Your bowel movements are bloody, dark red, or black in color.  You vomit or cough up blood.  You have blood in your urine.  You cough, wheeze, or feel short of breath. If you have any of the following symptoms, this is an emergency. Do not wait to see if the pain will go away. Get medical help at once. Call your local emergency services (911 in the [place] Do not drive yourself to the hospital.  You have severe chest pain, especially if the pain is crushing or pressure-like and spreads to the  arms, back, neck, or jaw.  You have stroke-like symptoms, such as: ? Loss of vision. ? Difficulty talking. ? Numbness or weakness on one side of your body. ? Numbness or weakness in your arm or leg. ? Not thinking clearly or feeling confused.  This information is not intended to replace advice given to you by your health care provider. Make  sure you discuss any questions you have with your health care provider. Document Released: 04/16/2008 Document Revised: 09/11/2015 Document Reviewed: 08/09/2013 Elsevier Interactive Patient Education  2018 [employer].

## 2017-11-30 DIAGNOSIS — Z79899 Other long term (current) drug therapy: Secondary | ICD-10-CM | POA: Diagnosis not present

## 2017-11-30 DIAGNOSIS — N952 Postmenopausal atrophic vaginitis: Secondary | ICD-10-CM | POA: Diagnosis not present

## 2017-11-30 DIAGNOSIS — R310 Gross hematuria: Secondary | ICD-10-CM | POA: Diagnosis not present

## 2017-12-06 DIAGNOSIS — R310 Gross hematuria: Secondary | ICD-10-CM | POA: Diagnosis not present

## 2017-12-08 DIAGNOSIS — R319 Hematuria, unspecified: Secondary | ICD-10-CM | POA: Diagnosis not present

## 2017-12-08 DIAGNOSIS — R310 Gross hematuria: Secondary | ICD-10-CM | POA: Diagnosis not present

## 2018-01-06 DIAGNOSIS — L400 Psoriasis vulgaris: Secondary | ICD-10-CM | POA: Diagnosis not present

## 2018-01-06 DIAGNOSIS — L4050 Arthropathic psoriasis, unspecified: Secondary | ICD-10-CM | POA: Diagnosis not present

## 2018-01-06 DIAGNOSIS — R531 Weakness: Secondary | ICD-10-CM | POA: Diagnosis not present

## 2018-01-19 DIAGNOSIS — M25551 Pain in right hip: Secondary | ICD-10-CM | POA: Diagnosis not present

## 2018-01-19 DIAGNOSIS — M25562 Pain in left knee: Secondary | ICD-10-CM | POA: Diagnosis not present

## 2018-01-19 DIAGNOSIS — M25461 Effusion, right knee: Secondary | ICD-10-CM | POA: Diagnosis not present

## 2018-01-19 DIAGNOSIS — M25561 Pain in right knee: Secondary | ICD-10-CM | POA: Diagnosis not present

## 2018-01-19 DIAGNOSIS — Z96651 Presence of right artificial knee joint: Secondary | ICD-10-CM | POA: Diagnosis not present

## 2018-01-19 DIAGNOSIS — M25552 Pain in left hip: Secondary | ICD-10-CM | POA: Diagnosis not present

## 2018-01-25 DIAGNOSIS — Z79899 Other long term (current) drug therapy: Secondary | ICD-10-CM | POA: Diagnosis not present

## 2018-01-25 DIAGNOSIS — I480 Paroxysmal atrial fibrillation: Secondary | ICD-10-CM | POA: Diagnosis not present

## 2018-01-25 DIAGNOSIS — E1165 Type 2 diabetes mellitus with hyperglycemia: Secondary | ICD-10-CM | POA: Diagnosis not present

## 2018-01-25 DIAGNOSIS — I10 Essential (primary) hypertension: Secondary | ICD-10-CM | POA: Diagnosis not present

## 2018-01-25 DIAGNOSIS — E785 Hyperlipidemia, unspecified: Secondary | ICD-10-CM | POA: Diagnosis not present

## 2018-01-25 DIAGNOSIS — Z1231 Encounter for screening mammogram for malignant neoplasm of breast: Secondary | ICD-10-CM | POA: Diagnosis not present

## 2018-01-25 DIAGNOSIS — E039 Hypothyroidism, unspecified: Secondary | ICD-10-CM | POA: Diagnosis not present

## 2018-02-01 ENCOUNTER — Ambulatory Visit (INDEPENDENT_AMBULATORY_CARE_PROVIDER_SITE_OTHER): Payer: PPO | Admitting: Cardiology

## 2018-02-01 ENCOUNTER — Encounter: Payer: Self-pay | Admitting: Cardiology

## 2018-02-01 VITALS — BP 130/76 | HR 69 | Ht 61.0 in | Wt 217.0 lb

## 2018-02-01 DIAGNOSIS — E785 Hyperlipidemia, unspecified: Secondary | ICD-10-CM

## 2018-02-01 DIAGNOSIS — E119 Type 2 diabetes mellitus without complications: Secondary | ICD-10-CM | POA: Diagnosis not present

## 2018-02-01 DIAGNOSIS — I10 Essential (primary) hypertension: Secondary | ICD-10-CM | POA: Diagnosis not present

## 2018-02-01 DIAGNOSIS — I480 Paroxysmal atrial fibrillation: Secondary | ICD-10-CM | POA: Diagnosis not present

## 2018-02-01 DIAGNOSIS — I7090 Unspecified atherosclerosis: Secondary | ICD-10-CM | POA: Diagnosis not present

## 2018-02-01 NOTE — Patient Instructions (Signed)
Medication Instructions:  Your physician recommends that you continue on your current medications as directed. Please refer to the Current Medication list given to you today.  Labwork: Your physician recommends that you have the following labs drawn: BMP to be done 2-3 days before cardiac scan.  Testing/Procedures:   Please arrive at the [facility] main entrance of [facility], you will be called with an appointment date and time (30-45 minutes prior to test start time).  [facility] [address] [place],  [address] [phone]  Proceed to the [facility] (First Floor).  Please follow these instructions carefully (unless otherwise directed):  Hold all erectile dysfunction medications at least 48 hours prior to test.  On the Night Before the Test: . Drink plenty of water. . Do not consume any caffeinated/decaffeinated beverages or chocolate 12 hours prior to your test. . Do not take any antihistamines 12 hours prior to your test. . If you take Metformin and Janumet do not take 24 hours prior to test.  On the Day of the Test: . Drink plenty of water. Do not drink any water within one hour of the test. . Do not eat any food 4 hours prior to the test. . You may take your regular medications prior to the test. . Take 50 mg of lopressor (metoprolol) one hour before the test. . HOLD Furosemide morning of the test. . Do not take glimepiride, jardiance, and tradjenta the morning of the testing.  After the Test: . Drink plenty of water. . After receiving IV contrast, you may experience a mild flushed feeling. This is normal. . On occasion, you may experience a mild rash up to 24 hours after the test. This is not dangerous. If this occurs, you can take Benadryl 25 mg and increase your fluid intake. . If you experience trouble breathing, this can be serious. If it is severe call 911 IMMEDIATELY. If it is mild, please call our  office. . If you take any of these medications: Glipizide/Metformin, Avandament, Glucavance, please do not take 48 hours after completing test.   Follow-Up: Your physician recommends that you schedule a follow-up appointment in: 3 months  Any Other Special Instructions Will Be Listed Below (If Applicable).  PFT's will be due by the time of your next visit.   If you need a refill on your cardiac medications before your next appointment, please call your pharmacy.   [place], RN, BSN

## 2018-02-01 NOTE — Progress Notes (Signed)
Cardiology Office Note:    Date:  02/01/2018   ID:  Crystal Ramos, DOB Apr 06, 1956, MRN [id number]  PCP:  Nicoletta Dress, MD  Cardiologist:  Jenean Lindau, MD   Referring MD: Nicoletta Dress, MD    ASSESSMENT:    1. Atherosclerotic vascular disease   2. Essential hypertension   3. Paroxysmal atrial fibrillation (HCC)   4. Dyslipidemia   5. Morbid obesity ([place])   6. Type 2 diabetes mellitus without complication, without long-term current use of insulin (HCC)    PLAN:    In order of problems listed above:  1. Primary prevention stressed with the patient.  Importance of compliance with diet and medication stressed and she vocalized understanding. 2. I discussed with the patient atrial fibrillation, disease process. Management and therapy including rate and rhythm control, anticoagulation benefits and potential risks were discussed extensively with the patient. Patient had multiple questions which were answered to patient's satisfaction. 3. She had a inconclusive stress test.  She was supposed to have a CT coronary angiography.  She mentions to me that she was not contacted for this.  We will try to set this up.   4. She takes 100 mg of amiodarone on a daily basis.  Her blood work was done recently by her primary care physician.  I reviewed pulmonary function test done last year.  We will recheck this in 3 months.  Based on the reports of the CT coronary angiography we might consider switching this medication to another. 5. Patient will be seen in follow-up appointment in 3 months or earlier if the patient has any concerns    Medication Adjustments/Labs and Tests Ordered: Current medicines are reviewed at length with the patient today.  Concerns regarding medicines are outlined above.  No orders of the defined types were placed in this encounter.  No orders of the defined types were placed in this encounter.    No chief complaint on file.    History of Present  Illness:    Crystal Ramos is a 62 y.o. female.  The patient has history approximately fibrillation.  She denies any problems at this time and takes care of activities of daily living.  No chest pain orthopnea or PND.  She is walking on a regular basis some without any symptoms.  She has not exercising regularly.  She has a sedentary lifestyle.  At the time of my evaluation, the patient is alert awake oriented and in no distress.  Past Medical History:  Diagnosis Date  . Acute diastolic heart failure ([place])   . Atrial fibrillation ([place])   . Chest pain   . Essential hypertension     Past Surgical History:  Procedure Laterality Date  . CHOLECYSTECTOMY      Current Medications: Current Meds  Medication Sig  . amiodarone (PACERONE) 200 MG tablet Take 100 mg by mouth daily.  Marland Kitchen atorvastatin (LIPITOR) 40 MG tablet Take 40 mg by mouth daily.  . empagliflozin (JARDIANCE) 25 MG TABS tablet Take 25 mg by mouth daily.  Marland Kitchen glimepiride (AMARYL) 4 MG tablet Take 4 mg by mouth.  . levothyroxine (SYNTHROID, LEVOTHROID) 75 MCG tablet Take 75 mcg by mouth daily.  Marland Kitchen linagliptin (TRADJENTA) 5 MG TABS tablet Take 5 mg by mouth.  Marland Kitchen lisinopril (PRINIVIL,ZESTRIL) 40 MG tablet Take 40 mg by mouth daily.  . metFORMIN (GLUCOPHAGE) 850 MG tablet Take 850 mg by mouth 3 (three) times daily.  . metoprolol succinate (TOPROL-XL) 50 MG 24 hr  tablet Take 50 mg by mouth daily. Take with or immediately following a meal.  . rivaroxaban (XARELTO) 20 MG TABS tablet Take 20 mg by mouth daily with supper.  . SitaGLIPtin-MetFORMIN HCl (JANUMET XR) [phone] MG TB24 Take by mouth daily.     Allergies:   Patient has no known allergies.   Social History   Socioeconomic History  . Marital status: Married    Spouse name: Not on file  . Number of children: Not on file  . Years of education: Not on file  . Highest education level: Not on file  Occupational History  . Not on file  Social Needs  . Financial resource strain:  Not on file  . Food insecurity:    Worry: Not on file    Inability: Not on file  . Transportation needs:    Medical: Not on file    Non-medical: Not on file  Tobacco Use  . Smoking status: Passive Smoke Exposure - Never Smoker  . Smokeless tobacco: Never Used  . Tobacco comment: "whole family" smoked in the house  Substance and Sexual Activity  . Alcohol use: No  . Drug use: No  . Sexual activity: Not on file  Lifestyle  . Physical activity:    Days per week: Not on file    Minutes per session: Not on file  . Stress: Not on file  Relationships  . Social connections:    Talks on phone: Not on file    Gets together: Not on file    Attends religious service: Not on file    Active member of club or organization: Not on file    Attends meetings of clubs or organizations: Not on file    Relationship status: Not on file  Other Topics Concern  . Not on file  Social History Narrative  . Not on file     Family History: The patient's family history includes Breast cancer in her paternal aunt; Diabetes in her mother; Stroke in her father and paternal aunt.  ROS:   Please see the history of present illness.    All other systems reviewed and are negative.  EKGs/Labs/Other Studies Reviewed:    The following studies were reviewed today: I discussed my findings with the patient at extensive length.   Recent Labs: 04/12/2017: BNP 62.3 06/07/2017: BUN 10; Creatinine, Ser 0.74; Hemoglobin 14.3; Platelets 140; Potassium 4.4; Sodium 139; TSH 5.600 08/02/2017: ALT 39  Recent Lipid Panel    Component Value Date/Time   CHOL 168 06/07/2017 0912   TRIG 107 06/07/2017 0912   HDL 60 06/07/2017 0912   CHOLHDL 2.8 06/07/2017 0912   LDLCALC 87 06/07/2017 0912    Physical Exam:    VS:  BP 130/76 (BP Location: Right Arm, Patient Position: Sitting, Cuff Size: Normal)   Pulse 69   Ht 5\' 1"  (1.549 m)   Wt 217 lb (98.4 kg)   SpO2 97%   BMI 41.00 kg/m     Wt Readings from Last 3  Encounters:  02/01/18 217 lb (98.4 kg)  11/29/17 215 lb (97.5 kg)  08/02/17 214 lb (97.1 kg)     GEN: Patient is in no acute distress HEENT: Normal NECK: No JVD; No carotid bruits LYMPHATICS: No lymphadenopathy CARDIAC: Hear sounds regular, 2/6 systolic murmur at the apex. RESPIRATORY:  Clear to auscultation without rales, wheezing or rhonchi  ABDOMEN: Soft, non-tender, non-distended MUSCULOSKELETAL:  No edema; No deformity  SKIN: Warm and dry NEUROLOGIC:  Alert and oriented x 3  PSYCHIATRIC:  Normal affect   Signed, Jenean Lindau, MD  02/01/2018 9:36 AM    [facility]

## 2018-02-02 DIAGNOSIS — Z6841 Body Mass Index (BMI) 40.0 and over, adult: Secondary | ICD-10-CM | POA: Diagnosis not present

## 2018-02-02 DIAGNOSIS — R2241 Localized swelling, mass and lump, right lower limb: Secondary | ICD-10-CM | POA: Diagnosis not present

## 2018-02-02 DIAGNOSIS — M25561 Pain in right knee: Secondary | ICD-10-CM | POA: Diagnosis not present

## 2018-02-07 DIAGNOSIS — M1711 Unilateral primary osteoarthritis, right knee: Secondary | ICD-10-CM | POA: Diagnosis not present

## 2018-02-07 DIAGNOSIS — M7041 Prepatellar bursitis, right knee: Secondary | ICD-10-CM | POA: Diagnosis not present

## 2018-02-07 DIAGNOSIS — Z96651 Presence of right artificial knee joint: Secondary | ICD-10-CM | POA: Diagnosis not present

## 2018-02-09 ENCOUNTER — Other Ambulatory Visit: Payer: Self-pay

## 2018-02-09 ENCOUNTER — Other Ambulatory Visit: Payer: Self-pay | Admitting: Cardiology

## 2018-02-09 DIAGNOSIS — I209 Angina pectoris, unspecified: Secondary | ICD-10-CM

## 2018-02-28 DIAGNOSIS — Z1231 Encounter for screening mammogram for malignant neoplasm of breast: Secondary | ICD-10-CM | POA: Diagnosis not present

## 2018-03-07 ENCOUNTER — Ambulatory Visit (HOSPITAL_COMMUNITY): Payer: PPO

## 2018-03-16 ENCOUNTER — Telehealth: Payer: Self-pay | Admitting: Cardiology

## 2018-03-16 NOTE — Telephone Encounter (Signed)
FYI

## 2018-03-16 NOTE — Telephone Encounter (Signed)
Patient called and wanted Dr. Geraldo Pitter to know that she was not able to have Cardiac CT due to she was unable to afford the copay amount up front and they would not do.

## 2018-04-07 DIAGNOSIS — L4050 Arthropathic psoriasis, unspecified: Secondary | ICD-10-CM | POA: Diagnosis not present

## 2018-04-07 DIAGNOSIS — C44319 Basal cell carcinoma of skin of other parts of face: Secondary | ICD-10-CM | POA: Diagnosis not present

## 2018-04-07 DIAGNOSIS — L400 Psoriasis vulgaris: Secondary | ICD-10-CM | POA: Diagnosis not present

## 2018-04-20 DIAGNOSIS — C44329 Squamous cell carcinoma of skin of other parts of face: Secondary | ICD-10-CM | POA: Diagnosis not present

## 2018-04-30 ENCOUNTER — Encounter: Payer: Self-pay | Admitting: Cardiology

## 2018-04-30 DIAGNOSIS — Z23 Encounter for immunization: Secondary | ICD-10-CM | POA: Diagnosis not present

## 2018-04-30 DIAGNOSIS — I480 Paroxysmal atrial fibrillation: Secondary | ICD-10-CM | POA: Diagnosis not present

## 2018-04-30 DIAGNOSIS — Z6839 Body mass index (BMI) 39.0-39.9, adult: Secondary | ICD-10-CM | POA: Diagnosis not present

## 2018-04-30 DIAGNOSIS — E785 Hyperlipidemia, unspecified: Secondary | ICD-10-CM | POA: Diagnosis not present

## 2018-04-30 DIAGNOSIS — E1165 Type 2 diabetes mellitus with hyperglycemia: Secondary | ICD-10-CM | POA: Diagnosis not present

## 2018-04-30 DIAGNOSIS — I10 Essential (primary) hypertension: Secondary | ICD-10-CM | POA: Diagnosis not present

## 2018-04-30 DIAGNOSIS — Z79899 Other long term (current) drug therapy: Secondary | ICD-10-CM | POA: Diagnosis not present

## 2018-04-30 DIAGNOSIS — M8589 Other specified disorders of bone density and structure, multiple sites: Secondary | ICD-10-CM | POA: Diagnosis not present

## 2018-04-30 DIAGNOSIS — E039 Hypothyroidism, unspecified: Secondary | ICD-10-CM | POA: Diagnosis not present

## 2018-05-03 ENCOUNTER — Encounter: Payer: Self-pay | Admitting: Cardiology

## 2018-05-03 ENCOUNTER — Ambulatory Visit (INDEPENDENT_AMBULATORY_CARE_PROVIDER_SITE_OTHER): Payer: PPO | Admitting: Cardiology

## 2018-05-03 VITALS — BP 126/72 | HR 79 | Ht 61.0 in | Wt 210.0 lb

## 2018-05-03 DIAGNOSIS — I7090 Unspecified atherosclerosis: Secondary | ICD-10-CM | POA: Diagnosis not present

## 2018-05-03 DIAGNOSIS — I480 Paroxysmal atrial fibrillation: Secondary | ICD-10-CM | POA: Diagnosis not present

## 2018-05-03 DIAGNOSIS — I10 Essential (primary) hypertension: Secondary | ICD-10-CM | POA: Diagnosis not present

## 2018-05-03 DIAGNOSIS — E119 Type 2 diabetes mellitus without complications: Secondary | ICD-10-CM

## 2018-05-03 DIAGNOSIS — E785 Hyperlipidemia, unspecified: Secondary | ICD-10-CM | POA: Diagnosis not present

## 2018-05-03 MED ORDER — AMIODARONE HCL 200 MG PO TABS: 100.0000 mg | ORAL_TABLET | ORAL | 0 refills | Status: DC

## 2018-05-03 NOTE — Progress Notes (Signed)
Cardiology Office Note:    Date:  05/03/2018   ID:  Crystal Ramos, DOB April 20, 1956, MRN [id number]  PCP:  Nicoletta Dress, MD  Cardiologist:  Jenean Lindau, MD   Referring MD: Nicoletta Dress, MD    ASSESSMENT:    1. Paroxysmal atrial fibrillation (HCC)   2. Essential hypertension   3. Atherosclerotic vascular disease   4. Type 2 diabetes mellitus without complication, without long-term current use of insulin ([place])   5. Dyslipidemia   6. Morbid obesity ([place])    PLAN:    In order of problems listed above:  1. Secondary prevention stressed with the patient.  Importance of compliance with diet and medication stressed and she vocalized understanding.  Diet was discussed for dyslipidemia, obesity and diabetes mellitus.  Weight reduction was stressed and risks of obesity emphasized. 2. Patient has had no palpitations or any such issues.  Atrial fibrillation appears to be stable. I discussed with the patient atrial fibrillation, disease process. Management and therapy including rate and rhythm control, anticoagulation benefits and potential risks were discussed extensively with the patient. Patient had multiple questions which were answered to patient's satisfaction. 3. Her amiodarone was reduced to 100 mg every other day and I told her to go back to 100 mg every day if she has recurrence and palpitations and she vocalized understanding.  Her lipids are followed by primary care physician and were done recently and found to be normal 4. Patient will be seen in follow-up appointment in 6 months or earlier if the patient has any concerns    Medication Adjustments/Labs and Tests Ordered: Current medicines are reviewed at length with the patient today.  Concerns regarding medicines are outlined above.  No orders of the defined types were placed in this encounter.  Meds ordered this encounter  Medications  . amiodarone (PACERONE) 200 MG tablet    Sig: Take 0.5 tablets (100 mg  total) by mouth every other day.    Dispense:  90 tablet    Refill:  0     No chief complaint on file.    History of Present Illness:    Crystal Ramos is a 62 y.o. female.  Patient denies any problems at this time and takes care of activities of daily living.  No chest pain orthopnea or PND.  She was scheduled to get CT coronary angiography but this was canceled by the patient because she could not afford the co-pay.  Patient takes care of activities of daily living and has begun an exercise program and walks 20 to 25 minutes a day without any problems.  He does not want any further evaluation for her coronary artery disease as she is asymptomatic.  She tells me that she is done well to exercise and take her medications regularly.  Past Medical History:  Diagnosis Date  . Acute diastolic heart failure ([place])   . Atrial fibrillation ([place])   . Chest pain   . Essential hypertension     Past Surgical History:  Procedure Laterality Date  . CHOLECYSTECTOMY      Current Medications: Current Meds  Medication Sig  . amiodarone (PACERONE) 200 MG tablet Take 0.5 tablets (100 mg total) by mouth every other day.  Marland Kitchen atorvastatin (LIPITOR) 40 MG tablet Take 40 mg by mouth daily.  . empagliflozin (JARDIANCE) 25 MG TABS tablet Take 25 mg by mouth daily.  Marland Kitchen glimepiride (AMARYL) 4 MG tablet Take 4 mg by mouth daily with breakfast.   .  levothyroxine (SYNTHROID, LEVOTHROID) 75 MCG tablet Take 75 mcg by mouth daily.  Marland Kitchen linagliptin (TRADJENTA) 5 MG TABS tablet Take 5 mg by mouth daily.   Marland Kitchen lisinopril (PRINIVIL,ZESTRIL) 40 MG tablet Take 40 mg by mouth daily.  . metFORMIN (GLUCOPHAGE) 850 MG tablet Take 850 mg by mouth 3 (three) times daily.  . metoprolol succinate (TOPROL-XL) 50 MG 24 hr tablet Take 50 mg by mouth daily. Take with or immediately following a meal.  . rivaroxaban (XARELTO) 20 MG TABS tablet Take 20 mg by mouth daily with supper.  . SitaGLIPtin-MetFORMIN HCl (JANUMET XR) [phone] MG  TB24 Take 1 tablet by mouth daily.   . [DISCONTINUED] amiodarone (PACERONE) 200 MG tablet Take 100 mg by mouth daily.     Allergies:   Patient has no known allergies.   Social History   Socioeconomic History  . Marital status: Married    Spouse name: Not on file  . Number of children: Not on file  . Years of education: Not on file  . Highest education level: Not on file  Occupational History  . Not on file  Social Needs  . Financial resource strain: Not on file  . Food insecurity:    Worry: Not on file    Inability: Not on file  . Transportation needs:    Medical: Not on file    Non-medical: Not on file  Tobacco Use  . Smoking status: Passive Smoke Exposure - Never Smoker  . Smokeless tobacco: Never Used  . Tobacco comment: "whole family" smoked in the house  Substance and Sexual Activity  . Alcohol use: No  . Drug use: No  . Sexual activity: Not on file  Lifestyle  . Physical activity:    Days per week: Not on file    Minutes per session: Not on file  . Stress: Not on file  Relationships  . Social connections:    Talks on phone: Not on file    Gets together: Not on file    Attends religious service: Not on file    Active member of club or organization: Not on file    Attends meetings of clubs or organizations: Not on file    Relationship status: Not on file  Other Topics Concern  . Not on file  Social History Narrative  . Not on file     Family History: The patient's family history includes Breast cancer in her paternal aunt; Diabetes in her mother; Stroke in her father and paternal aunt.  ROS:   Please see the history of present illness.    All other systems reviewed and are negative.  EKGs/Labs/Other Studies Reviewed:    The following studies were reviewed today: I discussed my findings with the patient at extensive length.   Recent Labs: 06/07/2017: BUN 10; Creatinine, Ser 0.74; Hemoglobin 14.3; Platelets 140; Potassium 4.4; Sodium 139; TSH  5.600 08/02/2017: ALT 39  Recent Lipid Panel    Component Value Date/Time   CHOL 168 06/07/2017 0912   TRIG 107 06/07/2017 0912   HDL 60 06/07/2017 0912   CHOLHDL 2.8 06/07/2017 0912   LDLCALC 87 06/07/2017 0912    Physical Exam:    VS:  BP 126/72 (BP Location: Right Arm, Patient Position: Sitting, Cuff Size: Normal)   Pulse 79   Ht 5\' 1"  (1.549 m)   Wt 210 lb (95.3 kg)   SpO2 96%   BMI 39.68 kg/m     Wt Readings from Last 3 Encounters:  05/03/18 210 lb (  95.3 kg)  02/01/18 217 lb (98.4 kg)  11/29/17 215 lb (97.5 kg)     GEN: Patient is in no acute distress HEENT: Normal NECK: No JVD; No carotid bruits LYMPHATICS: No lymphadenopathy CARDIAC: Hear sounds regular, 2/6 systolic murmur at the apex. RESPIRATORY:  Clear to auscultation without rales, wheezing or rhonchi  ABDOMEN: Soft, non-tender, non-distended MUSCULOSKELETAL:  No edema; No deformity  SKIN: Warm and dry NEUROLOGIC:  Alert and oriented x 3 PSYCHIATRIC:  Normal affect   Signed, Jenean Lindau, MD  05/03/2018 9:27 AM    [place]

## 2018-05-03 NOTE — Patient Instructions (Signed)
Medication Instructions:  Your physician has recommended you make the following change in your medication:   CHANGE amiodarone to 100 mg every other day  If you need a refill on your cardiac medications before your next appointment, please call your pharmacy.   Lab work: None  If you have labs (blood work) drawn today and your tests are completely normal, you will receive your results only by: Marland Kitchen MyChart Message (if you have MyChart) OR . A paper copy in the mail If you have any lab test that is abnormal or we need to change your treatment, we will call you to review the results.  Testing/Procedures: None  Follow-Up: At [facility], you and your health needs are our priority.  As part of our continuing mission to provide you with exceptional heart care, we have created designated Provider Care Teams.  These Care Teams include your primary Cardiologist (physician) and Advanced Practice Providers (APPs -  Physician Assistants and Nurse Practitioners) who all work together to provide you with the care you need, when you need it.  You will need a follow up appointment in 6 months.  Please call our office 2 months in advance to schedule this appointment.  You may see another member of our [employer] Provider Team in [place]: Jenne Campus, MD . Shirlee More, MD  Any Other Special Instructions Will Be Listed Below (If Applicable).

## 2018-05-09 ENCOUNTER — Telehealth: Payer: Self-pay | Admitting: Cardiology

## 2018-05-09 NOTE — Telephone Encounter (Signed)
Patient reports that her "heart was beating really fast" on Friday evening. Patient denies having any episodes since then and says she feels fine. Reviewed patient's medications and she denies picking up the amiodarone prescription that was sent in on 05/03/18 during her office visit with Dr. Geraldo Pitter. She states "I must have misunderstood." Advised patient to pick up amiodarone today and start taking this medication today. Patient reports taking all other medications as prescribed. Advised patient to contact our office with further questions or concerns. Patient verbalized understanding.

## 2018-05-09 NOTE — Telephone Encounter (Signed)
Patient states that she is having irregular heartrate since Friday evening, she has been feeling sob over weekend and she has rather got o office to see RRR instead of going to ER, she has not been taking meds.. please cal patient

## 2018-05-10 ENCOUNTER — Telehealth: Payer: Self-pay | Admitting: Cardiology

## 2018-05-10 MED ORDER — METOPROLOL SUCCINATE ER 50 MG PO TB24: 50.0000 mg | ORAL_TABLET | Freq: Every day | ORAL | 5 refills | Status: DC

## 2018-05-10 NOTE — Telephone Encounter (Signed)
After discussing patient's medications yesterday, she went home to double check that she has all of the medications we discussed. Patient reports that she does not have metoprolol succinate 50 mg at this time. Refill sent to [facility] in [place]. No further questions.

## 2018-05-10 NOTE — Telephone Encounter (Signed)
Patient called back this am and she states that she does nothave one of the meds that you mentioned yeteray, seh uses the [facility] [place]

## 2018-05-17 ENCOUNTER — Encounter: Payer: Self-pay | Admitting: Cardiology

## 2018-05-17 ENCOUNTER — Ambulatory Visit (INDEPENDENT_AMBULATORY_CARE_PROVIDER_SITE_OTHER): Payer: PPO | Admitting: Cardiology

## 2018-05-17 ENCOUNTER — Telehealth: Payer: Self-pay | Admitting: Emergency Medicine

## 2018-05-17 VITALS — BP 122/68 | HR 84 | Ht 61.0 in | Wt 218.0 lb

## 2018-05-17 DIAGNOSIS — J90 Pleural effusion, not elsewhere classified: Secondary | ICD-10-CM | POA: Diagnosis not present

## 2018-05-17 DIAGNOSIS — I10 Essential (primary) hypertension: Secondary | ICD-10-CM | POA: Diagnosis not present

## 2018-05-17 DIAGNOSIS — E785 Hyperlipidemia, unspecified: Secondary | ICD-10-CM

## 2018-05-17 DIAGNOSIS — E119 Type 2 diabetes mellitus without complications: Secondary | ICD-10-CM | POA: Diagnosis not present

## 2018-05-17 DIAGNOSIS — R0609 Other forms of dyspnea: Secondary | ICD-10-CM | POA: Diagnosis not present

## 2018-05-17 DIAGNOSIS — I7090 Unspecified atherosclerosis: Secondary | ICD-10-CM

## 2018-05-17 DIAGNOSIS — I480 Paroxysmal atrial fibrillation: Secondary | ICD-10-CM | POA: Diagnosis not present

## 2018-05-17 MED ORDER — AMIODARONE HCL 100 MG PO TABS: 100.0000 mg | ORAL_TABLET | Freq: Every day | ORAL | 1 refills | Status: DC

## 2018-05-17 NOTE — Telephone Encounter (Signed)
Dr. Carolanne Grumbling office called stating patient is in a fib rvr with a heart rate of 136, she is also complaining of shortness of breath. Patient is taking metoprolol 50 mg twice daily which Dr. Carolanne Grumbling increased to this today. She is also taking amiodarone 100 mg daily. Dr. Geraldo Pitter informed, patient added to schedule to be seen today she is aware of appointment.

## 2018-05-17 NOTE — Patient Instructions (Signed)
Medication Instructions:  Your physician has recommended you make the following change in your medication:  CHANGE: Amiodarone 100 mg Daily After taking 200 mg for three days  If you need a refill on your cardiac medications before your next appointment, please call your pharmacy.   Lab work: None  If you have labs (blood work) drawn today and your tests are completely normal, you will receive your results only by: Marland Kitchen MyChart Message (if you have MyChart) OR . A paper copy in the mail If you have any lab test that is abnormal or we need to change your treatment, we will call you to review the results.  Testing/Procedures: None  Follow-Up: At [facility] you and your health needs are our priority.  As part of our continuing mission to provide you with exceptional heart care, we have created designated Provider Care Teams.  These Care Teams include your primary Cardiologist (physician) and Advanced Practice Providers (APPs -  Physician Assistants and Nurse Practitioners) who all work together to provide you with the care you need, when you need it. You will need a follow up appointment in 2 weeks.  Please call our office 2 months in advance to schedule this appointment.  You may see No primary care provider on file. or another member of our [employer] Provider Team in [place]: Jenne Campus, MD . Shirlee More, MD  Any Other Special Instructions Will Be Listed Below (If Applicable).

## 2018-05-17 NOTE — Progress Notes (Signed)
Cardiology Office Note:    Date:  05/17/2018   ID:  Crystal Ramos, DOB 14-Nov-1955, MRN [id number]  PCP:  Nicoletta Dress, MD  Cardiologist:  Jenean Lindau, MD   Referring MD: Nicoletta Dress, MD    ASSESSMENT:    1. Paroxysmal atrial fibrillation (HCC)   2. Essential hypertension   3. Type 2 diabetes mellitus without complication, without long-term current use of insulin ([place])   4. Dyslipidemia   5. Morbid obesity ([place])   6. Atherosclerotic vascular disease    PLAN:    In order of problems listed above:  1. Primary prevention stressed with the patient.  Importance of compliance with diet and medication stressed and she vocalized understanding.  She is on anticoagulation and I have asked her to increase her amiodarone to 200 mg daily for 3 days and then lower it down to 100 mg daily.  Currently she is taking 100 mg every other day.  She will be back in follow-up appointment in 2 weeks or earlier if she has any concerns.  She knows to go to the nearest emergency room for any significant concerns.  I told her that if she clinically feels she is back to normal she can cut down her beta-blocker dose to the previous dose and she vocalized understanding.  Total time for above evaluation was 25 minutes.   Medication Adjustments/Labs and Tests Ordered: Current medicines are reviewed at length with the patient today.  Concerns regarding medicines are outlined above.  No orders of the defined types were placed in this encounter.  Meds ordered this encounter  Medications  . amiodarone (PACERONE) 100 MG tablet    Sig: Take 1 tablet (100 mg total) by mouth daily.    Dispense:  90 tablet    Refill:  1     No chief complaint on file.    History of Present Illness:    Crystal Ramos is a 62 y.o. female.  Patient has past medical history of atrial fibrillation.  She mentions to me that in the past couple of days has been noticing palpitations.  For this reason she went to her  primary care physician who doubled up her beta-blocker.  Patient mentions to me she is very anxious about it.  No chest pain orthopnea or PND.  At the time of my evaluation, the patient is alert awake oriented and in no distress.  Past Medical History:  Diagnosis Date  . Acute diastolic heart failure ([place])   . Atrial fibrillation ([place])   . Chest pain   . Essential hypertension     Past Surgical History:  Procedure Laterality Date  . CHOLECYSTECTOMY      Current Medications: Current Meds  Medication Sig  . amiodarone (PACERONE) 200 MG tablet Take 0.5 tablets (100 mg total) by mouth every other day.  Marland Kitchen atorvastatin (LIPITOR) 40 MG tablet Take 40 mg by mouth daily.  . empagliflozin (JARDIANCE) 25 MG TABS tablet Take 25 mg by mouth daily.  Marland Kitchen glimepiride (AMARYL) 4 MG tablet Take 4 mg by mouth daily with breakfast.   . levothyroxine (SYNTHROID, LEVOTHROID) 75 MCG tablet Take 75 mcg by mouth daily.  Marland Kitchen linagliptin (TRADJENTA) 5 MG TABS tablet Take 5 mg by mouth daily.   Marland Kitchen lisinopril (PRINIVIL,ZESTRIL) 40 MG tablet Take 40 mg by mouth daily.  . metFORMIN (GLUCOPHAGE) 850 MG tablet Take 850 mg by mouth 3 (three) times daily.  . metoprolol succinate (TOPROL-XL) 100 MG 24 hr tablet  Take 100 mg by mouth daily. Take with or immediately following a meal.  . rivaroxaban (XARELTO) 20 MG TABS tablet Take 20 mg by mouth daily with supper.  . SitaGLIPtin-MetFORMIN HCl (JANUMET XR) [phone] MG TB24 Take 1 tablet by mouth daily.      Allergies:   Patient has no known allergies.   Social History   Socioeconomic History  . Marital status: Married    Spouse name: Not on file  . Number of children: Not on file  . Years of education: Not on file  . Highest education level: Not on file  Occupational History  . Not on file  Social Needs  . Financial resource strain: Not on file  . Food insecurity:    Worry: Not on file    Inability: Not on file  . Transportation needs:    Medical: Not on file     Non-medical: Not on file  Tobacco Use  . Smoking status: Passive Smoke Exposure - Never Smoker  . Smokeless tobacco: Never Used  . Tobacco comment: "whole family" smoked in the house  Substance and Sexual Activity  . Alcohol use: No  . Drug use: No  . Sexual activity: Not on file  Lifestyle  . Physical activity:    Days per week: Not on file    Minutes per session: Not on file  . Stress: Not on file  Relationships  . Social connections:    Talks on phone: Not on file    Gets together: Not on file    Attends religious service: Not on file    Active member of club or organization: Not on file    Attends meetings of clubs or organizations: Not on file    Relationship status: Not on file  Other Topics Concern  . Not on file  Social History Narrative  . Not on file     Family History: The patient's family history includes Breast cancer in her paternal aunt; Diabetes in her mother; Stroke in her father and paternal aunt.  ROS:   Please see the history of present illness.    All other systems reviewed and are negative.  EKGs/Labs/Other Studies Reviewed:    The following studies were reviewed today: I discussed my findings with the patient at extensive length.  Her EKG done today at primary care office reveals atrial fibrillation with elevated ventricular rate.   Recent Labs: 06/07/2017: BUN 10; Creatinine, Ser 0.74; Hemoglobin 14.3; Platelets 140; Potassium 4.4; Sodium 139; TSH 5.600 08/02/2017: ALT 39  Recent Lipid Panel    Component Value Date/Time   CHOL 168 06/07/2017 0912   TRIG 107 06/07/2017 0912   HDL 60 06/07/2017 0912   CHOLHDL 2.8 06/07/2017 0912   LDLCALC 87 06/07/2017 0912    Physical Exam:    VS:  BP 122/68 (BP Location: Right Arm, Patient Position: Sitting, Cuff Size: Normal)   Pulse 84   Ht 5\' 1"  (1.549 m)   Wt 218 lb (98.9 kg)   SpO2 98%   BMI 41.19 kg/m     Wt Readings from Last 3 Encounters:  05/17/18 218 lb (98.9 kg)  05/03/18 210 lb (95.3  kg)  02/01/18 217 lb (98.4 kg)     GEN: Patient is in no acute distress HEENT: Normal NECK: No JVD; No carotid bruits LYMPHATICS: No lymphadenopathy CARDIAC: Hear sounds regular, 2/6 systolic murmur at the apex. RESPIRATORY:  Clear to auscultation without rales, wheezing or rhonchi  ABDOMEN: Soft, non-tender, non-distended MUSCULOSKELETAL:  No  edema; No deformity  SKIN: Warm and dry NEUROLOGIC:  Alert and oriented x 3 PSYCHIATRIC:  Normal affect   Signed, Jenean Lindau, MD  05/17/2018 2:44 PM    [place]

## 2018-05-31 ENCOUNTER — Encounter: Payer: Self-pay | Admitting: Cardiology

## 2018-05-31 ENCOUNTER — Ambulatory Visit (INDEPENDENT_AMBULATORY_CARE_PROVIDER_SITE_OTHER): Payer: PPO | Admitting: Cardiology

## 2018-05-31 VITALS — BP 128/70 | HR 124 | Ht 61.0 in | Wt 212.0 lb

## 2018-05-31 DIAGNOSIS — E785 Hyperlipidemia, unspecified: Secondary | ICD-10-CM | POA: Diagnosis not present

## 2018-05-31 DIAGNOSIS — I10 Essential (primary) hypertension: Secondary | ICD-10-CM

## 2018-05-31 DIAGNOSIS — E119 Type 2 diabetes mellitus without complications: Secondary | ICD-10-CM

## 2018-05-31 DIAGNOSIS — I480 Paroxysmal atrial fibrillation: Secondary | ICD-10-CM

## 2018-05-31 MED ORDER — AMIODARONE HCL 200 MG PO TABS: 200.0000 mg | ORAL_TABLET | Freq: Every day | ORAL | 1 refills | Status: DC

## 2018-05-31 NOTE — Progress Notes (Signed)
Cardiology Office Note:    Date:  05/31/2018   ID:  Crystal Ramos, DOB 09/09/1955, MRN [id number]  PCP:  Nicoletta Dress, MD  Cardiologist:  Jenean Lindau, MD   Referring MD: Nicoletta Dress, MD    ASSESSMENT:    1. Paroxysmal atrial fibrillation (HCC)   2. Essential hypertension   3. Type 2 diabetes mellitus without complication, without long-term current use of insulin ([place])   4. Dyslipidemia    PLAN:    In order of problems listed above:  1. Primary prevention stressed with the patient.  Importance of compliance with diet and medication stressed and she vocalized understanding.  Her blood pressure is stable.  Diet was discussed for dyslipidemia and obesity and risks of obesity explained and she plans to lose weight. 2. Her heart rate is elevated and she is in atrial fibrillation.  For this reason I have asked the patient to increase amiodarone to 200 mg daily and she will see me back in 2 to 3 weeks.  She knows to go to the nearest emergency room for any significant concerns.   Medication Adjustments/Labs and Tests Ordered: Current medicines are reviewed at length with the patient today.  Concerns regarding medicines are outlined above.  No orders of the defined types were placed in this encounter.  No orders of the defined types were placed in this encounter.    No chief complaint on file.    History of Present Illness:    Crystal Ramos is a 63 y.o. female.  She has past medical history of paroxysmal atrial fibrillation.  She denies any problems at this time and takes care of activities of daily living.  Her only problem is that she feels her heart going faster than usual.  She leads a sedentary lifestyle.  She is taking amiodarone 100 mg daily.  Past Medical History:  Diagnosis Date  . Acute diastolic heart failure ([place])   . Atrial fibrillation ([place])   . Chest pain   . Essential hypertension     Past Surgical History:  Procedure Laterality Date  .  CHOLECYSTECTOMY      Current Medications: Current Meds  Medication Sig  . amiodarone (PACERONE) 100 MG tablet Take 1 tablet (100 mg total) by mouth daily.  Marland Kitchen amiodarone (PACERONE) 200 MG tablet Take 0.5 tablets (100 mg total) by mouth every other day.  Marland Kitchen atorvastatin (LIPITOR) 40 MG tablet Take 40 mg by mouth daily.  . empagliflozin (JARDIANCE) 25 MG TABS tablet Take 25 mg by mouth daily.  Marland Kitchen glimepiride (AMARYL) 4 MG tablet Take 4 mg by mouth daily with breakfast.   . levothyroxine (SYNTHROID, LEVOTHROID) 75 MCG tablet Take 75 mcg by mouth daily.  Marland Kitchen linagliptin (TRADJENTA) 5 MG TABS tablet Take 5 mg by mouth daily.   Marland Kitchen lisinopril (PRINIVIL,ZESTRIL) 40 MG tablet Take 40 mg by mouth daily.  . metFORMIN (GLUCOPHAGE) 850 MG tablet Take 850 mg by mouth 3 (three) times daily.  . metoprolol succinate (TOPROL-XL) 100 MG 24 hr tablet Take 100 mg by mouth daily. Take with or immediately following a meal.  . rivaroxaban (XARELTO) 20 MG TABS tablet Take 20 mg by mouth daily with supper.  . SitaGLIPtin-MetFORMIN HCl (JANUMET XR) [phone] MG TB24 Take 1 tablet by mouth daily.      Allergies:   Patient has no known allergies.   Social History   Socioeconomic History  . Marital status: Married    Spouse name: Not on file  .  Number of children: Not on file  . Years of education: Not on file  . Highest education level: Not on file  Occupational History  . Not on file  Social Needs  . Financial resource strain: Not on file  . Food insecurity:    Worry: Not on file    Inability: Not on file  . Transportation needs:    Medical: Not on file    Non-medical: Not on file  Tobacco Use  . Smoking status: Passive Smoke Exposure - Never Smoker  . Smokeless tobacco: Never Used  . Tobacco comment: "whole family" smoked in the house  Substance and Sexual Activity  . Alcohol use: No  . Drug use: No  . Sexual activity: Not on file  Lifestyle  . Physical activity:    Days per week: Not on file     Minutes per session: Not on file  . Stress: Not on file  Relationships  . Social connections:    Talks on phone: Not on file    Gets together: Not on file    Attends religious service: Not on file    Active member of club or organization: Not on file    Attends meetings of clubs or organizations: Not on file    Relationship status: Not on file  Other Topics Concern  . Not on file  Social History Narrative  . Not on file     Family History: The patient's family history includes Breast cancer in her paternal aunt; Diabetes in her mother; Stroke in her father and paternal aunt.  ROS:   Please see the history of present illness.    All other systems reviewed and are negative.  EKGs/Labs/Other Studies Reviewed:    The following studies were reviewed today: EKG reveals atrial fibrillation with elevated ventricular rate.   Recent Labs: 06/07/2017: BUN 10; Creatinine, Ser 0.74; Hemoglobin 14.3; Platelets 140; Potassium 4.4; Sodium 139; TSH 5.600 08/02/2017: ALT 39  Recent Lipid Panel    Component Value Date/Time   CHOL 168 06/07/2017 0912   TRIG 107 06/07/2017 0912   HDL 60 06/07/2017 0912   CHOLHDL 2.8 06/07/2017 0912   LDLCALC 87 06/07/2017 0912    Physical Exam:    VS:  BP 128/70 (BP Location: Right Arm, Patient Position: Sitting, Cuff Size: Normal)   Pulse (!) 55   Ht 5\' 1"  (1.549 m)   Wt 212 lb (96.2 kg)   SpO2 97%   BMI 40.06 kg/m     Wt Readings from Last 3 Encounters:  05/31/18 212 lb (96.2 kg)  05/17/18 218 lb (98.9 kg)  05/03/18 210 lb (95.3 kg)     GEN: Patient is in no acute distress HEENT: Normal NECK: No JVD; No carotid bruits LYMPHATICS: No lymphadenopathy CARDIAC: Hear sounds irregular, 2/6 systolic murmur at the apex. RESPIRATORY:  Clear to auscultation without rales, wheezing or rhonchi  ABDOMEN: Soft, non-tender, non-distended MUSCULOSKELETAL:  No edema; No deformity  SKIN: Warm and dry NEUROLOGIC:  Alert and oriented x 3 PSYCHIATRIC:   Normal affect   Signed, Jenean Lindau, MD  05/31/2018 8:32 AM    [facility]

## 2018-05-31 NOTE — Patient Instructions (Addendum)
Medication Instructions:   Your physician has recommended you make the following change in your medication:   CHANGE: Amiodarone is now 200 mg my mouth once daily.   If you need a refill on your cardiac medications before your next appointment, please call your pharmacy.   Lab work:  Your physician recommends that you return for lab work today: TSH    If you have labs (blood work) drawn today and your tests are completely normal, you will receive your results only by: Marland Kitchen MyChart Message (if you have MyChart) OR . A paper copy in the mail If you have any lab test that is abnormal or we need to change your treatment, we will call you to review the results.  Testing/Procedures:  NONE  Follow-Up: At [facility], you and your health needs are our priority.  As part of our continuing mission to provide you with exceptional heart care, we have created designated Provider Care Teams.  These Care Teams include your primary Cardiologist (physician) and Advanced Practice Providers (APPs -  Physician Assistants and Nurse Practitioners) who all work together to provide you with the care you need, when you need it.  . You will need a follow up appointment in 3 months.

## 2018-06-01 ENCOUNTER — Telehealth: Payer: Self-pay

## 2018-06-01 LAB — TSH: TSH: 0.181 u[IU]/mL — ABNORMAL LOW (ref 0.450–4.500)

## 2018-06-01 NOTE — Telephone Encounter (Signed)
Attempted to inform pt of abnormal results. No answer and unable to leave VM. Will try again

## 2018-06-01 NOTE — Telephone Encounter (Signed)
-----   Message from Jenean Lindau, MD sent at 06/01/2018  8:18 AM EST ----- Please call the patient and let him know that his thyroid test is abnormal.  Please call the primary care physician's office and talk to the nurse about this and let them know to handle this.  Please document in detail. Jenean Lindau, MD 06/01/2018 8:15 AM

## 2018-07-11 DIAGNOSIS — L400 Psoriasis vulgaris: Secondary | ICD-10-CM | POA: Diagnosis not present

## 2018-08-01 DIAGNOSIS — I480 Paroxysmal atrial fibrillation: Secondary | ICD-10-CM | POA: Diagnosis not present

## 2018-08-01 DIAGNOSIS — E1165 Type 2 diabetes mellitus with hyperglycemia: Secondary | ICD-10-CM | POA: Diagnosis not present

## 2018-08-01 DIAGNOSIS — I10 Essential (primary) hypertension: Secondary | ICD-10-CM | POA: Diagnosis not present

## 2018-08-01 DIAGNOSIS — E039 Hypothyroidism, unspecified: Secondary | ICD-10-CM | POA: Diagnosis not present

## 2018-08-01 DIAGNOSIS — Z5320 Procedure and treatment not carried out because of patient's decision for unspecified reasons: Secondary | ICD-10-CM | POA: Diagnosis not present

## 2018-08-01 DIAGNOSIS — Z79899 Other long term (current) drug therapy: Secondary | ICD-10-CM | POA: Diagnosis not present

## 2018-08-01 DIAGNOSIS — H8113 Benign paroxysmal vertigo, bilateral: Secondary | ICD-10-CM | POA: Diagnosis not present

## 2018-08-01 DIAGNOSIS — Z6839 Body mass index (BMI) 39.0-39.9, adult: Secondary | ICD-10-CM | POA: Diagnosis not present

## 2018-08-01 DIAGNOSIS — Z1331 Encounter for screening for depression: Secondary | ICD-10-CM | POA: Diagnosis not present

## 2018-08-01 DIAGNOSIS — E785 Hyperlipidemia, unspecified: Secondary | ICD-10-CM | POA: Diagnosis not present

## 2018-08-26 ENCOUNTER — Telehealth (INDEPENDENT_AMBULATORY_CARE_PROVIDER_SITE_OTHER): Payer: PPO | Admitting: Cardiology

## 2018-08-26 ENCOUNTER — Encounter: Payer: Self-pay | Admitting: Cardiology

## 2018-08-26 ENCOUNTER — Other Ambulatory Visit: Payer: Self-pay

## 2018-08-26 VITALS — Ht 61.0 in | Wt 212.0 lb

## 2018-08-26 DIAGNOSIS — I10 Essential (primary) hypertension: Secondary | ICD-10-CM

## 2018-08-26 DIAGNOSIS — I480 Paroxysmal atrial fibrillation: Secondary | ICD-10-CM

## 2018-08-26 DIAGNOSIS — E119 Type 2 diabetes mellitus without complications: Secondary | ICD-10-CM

## 2018-08-26 DIAGNOSIS — E785 Hyperlipidemia, unspecified: Secondary | ICD-10-CM

## 2018-08-26 NOTE — Progress Notes (Signed)
Virtual Visit via Telephone Note   This visit type was conducted due to national recommendations for restrictions regarding the COVID-19 Pandemic (e.g. social distancing) in an effort to limit this patient's exposure and mitigate transmission in our community.  Due to her co-morbid illnesses, this patient is at least at moderate risk for complications without adequate follow up.  This format is felt to be most appropriate for this patient at this time.  The patient did not have access to video technology/had technical difficulties with video requiring transitioning to audio format only (telephone).  All issues noted in this document were discussed and addressed.  No physical exam could be performed with this format.  Please refer to the patient's chart for her  consent to telehealth for [facility].   Evaluation Performed:  Follow-up visit  Date:  08/26/2018   ID:  Crystal Ramos, DOB 03/06/1956, MRN [id number]  Patient Location: Home  Provider Location: Home   PCP:  Nicoletta Dress, MD  Cardiologist:  No primary care provider on file.  Electrophysiologist:  None   Chief Complaint:  PAF  History of Present Illness:    Crystal Ramos is a 63 y.o. female who presents via audio/video conferencing for a telehealth visit today.    Patient has paroxysmal atrial fibrillation and she denies any problems at this time and takes care of activities of daily living.  No chest pain orthopnea or PND.  She mentions to me that she saw her primary care physician recently and was evaluated and was given a good report.  She also had blood work and was told it was fine.  No chest pain orthopnea or PND.  At the time of my evaluation, the patient is alert awake oriented and in no distress.  The patient does not have symptoms concerning for COVID-19 infection (fever, chills, cough, or new shortness of breath).    Past Medical History:  Diagnosis Date  . Acute diastolic heart failure ([place])   . Atrial  fibrillation ([place])   . Chest pain   . Essential hypertension    Past Surgical History:  Procedure Laterality Date  . CHOLECYSTECTOMY       Current Meds  Medication Sig  . ALPRAZolam (XANAX) 0.25 MG tablet Take 1 tablet by mouth as needed.  Marland Kitchen atorvastatin (LIPITOR) 40 MG tablet Take 40 mg by mouth daily.  . empagliflozin (JARDIANCE) 25 MG TABS tablet Take 25 mg by mouth daily.  . furosemide (LASIX) 40 MG tablet Take 1 tablet by mouth daily.  Marland Kitchen glimepiride (AMARYL) 4 MG tablet Take 4 mg by mouth daily with breakfast.   . levothyroxine (SYNTHROID, LEVOTHROID) 50 MCG tablet Take 50 mcg by mouth daily.  Marland Kitchen linagliptin (TRADJENTA) 5 MG TABS tablet Take 5 mg by mouth daily.   . meclizine (ANTIVERT) 25 MG tablet Take 1 tablet by mouth daily.  . metFORMIN (GLUCOPHAGE) 850 MG tablet Take 850 mg by mouth 3 (three) times daily.  . metoprolol succinate (TOPROL-XL) 100 MG 24 hr tablet Take 100 mg by mouth daily. Take with or immediately following a meal.  . PACERONE 100 MG tablet Take 100 mg by mouth daily.  . rivaroxaban (XARELTO) 20 MG TABS tablet Take 20 mg by mouth daily with supper.     Allergies:   Patient has no known allergies.   Social History   Tobacco Use  . Smoking status: Passive Smoke Exposure - Never Smoker  . Smokeless tobacco: Never Used  . Tobacco comment: "  whole family" smoked in the house  Substance Use Topics  . Alcohol use: No  . Drug use: No     Family Hx: The patient's family history includes Breast cancer in her paternal aunt; Diabetes in her mother; Stroke in her father and paternal aunt.  ROS:   Please see the history of present illness.    No cp orthopnea or PND All other systems reviewed and are negative.   Prior CV studies:   The following studies were reviewed today:  none  Labs/Other Tests and Data Reviewed:    EKG:  No ECG reviewed.  Recent Labs: 05/31/2018: TSH 0.181   Recent Lipid Panel Lab Results  Component Value Date/Time   CHOL 168  06/07/2017 09:12 AM   TRIG 107 06/07/2017 09:12 AM   HDL 60 06/07/2017 09:12 AM   CHOLHDL 2.8 06/07/2017 09:12 AM   LDLCALC 87 06/07/2017 09:12 AM    Wt Readings from Last 3 Encounters:  08/26/18 212 lb (96.2 kg)  05/31/18 212 lb (96.2 kg)  05/17/18 218 lb (98.9 kg)     Objective:    Vital Signs:  Ht 5\' 1"  (1.549 m)   Wt 212 lb (96.2 kg)   BMI 40.06 kg/m    Well nourished, well developed female in no acute distress. She is not in any respiratory distress. Comfortable talking on the phone  ASSESSMENT & PLAN:    1. Paroxysmal atrial fibrillation:I discussed with the patient atrial fibrillation, disease process. Management and therapy including rate and rhythm control, anticoagulation benefits and potential risks were discussed extensively with the patient. Patient had multiple questions which were answered to patient's satisfaction. 2. Diet was discussed for dyslipidemia diabetes mellitus and obesity.  Risks of obesity explained and she vocalized understanding.  I outlined to her protocol for exercise 30 minutes a day of brisk walking at least 5 days a week and she vocalized understanding and plans to do so. 3. Patient will be seen in follow-up appointment in 3 months or earlier if the patient has any concerns   COVID-19 Education: The signs and symptoms of COVID-19 were discussed with the patient and how to seek care for testing (follow up with PCP or arrange E-visit). The importance of social distancing was discussed today.  Time:   Today, I have spent 15 minutes with the patient with telehealth technology discussing the above problems.     Medication Adjustments/Labs and Tests Ordered: Current medicines are reviewed at length with the patient today.  Concerns regarding medicines are outlined above.  Tests Ordered: No orders of the defined types were placed in this encounter.  Medication Changes: No orders of the defined types were placed in this encounter.    Disposition:  Follow up in 3 month(s)  Signed, Jenean Lindau, MD  08/26/2018 10:26 AM    [facility]

## 2018-08-26 NOTE — Patient Instructions (Signed)

## 2018-09-06 ENCOUNTER — Ambulatory Visit: Payer: PPO | Admitting: Cardiology

## 2018-09-08 DIAGNOSIS — N959 Unspecified menopausal and perimenopausal disorder: Secondary | ICD-10-CM | POA: Diagnosis not present

## 2018-09-08 DIAGNOSIS — Z0000 Encounter for general adult medical examination without abnormal findings: Secondary | ICD-10-CM | POA: Diagnosis not present

## 2018-09-08 DIAGNOSIS — Z136 Encounter for screening for cardiovascular disorders: Secondary | ICD-10-CM | POA: Diagnosis not present

## 2018-09-08 DIAGNOSIS — E785 Hyperlipidemia, unspecified: Secondary | ICD-10-CM | POA: Diagnosis not present

## 2018-09-08 DIAGNOSIS — Z6841 Body Mass Index (BMI) 40.0 and over, adult: Secondary | ICD-10-CM | POA: Diagnosis not present

## 2018-09-08 DIAGNOSIS — Z1339 Encounter for screening examination for other mental health and behavioral disorders: Secondary | ICD-10-CM | POA: Diagnosis not present

## 2018-09-08 DIAGNOSIS — Z9181 History of falling: Secondary | ICD-10-CM | POA: Diagnosis not present

## 2018-09-08 DIAGNOSIS — Z1331 Encounter for screening for depression: Secondary | ICD-10-CM | POA: Diagnosis not present

## 2018-09-08 DIAGNOSIS — E669 Obesity, unspecified: Secondary | ICD-10-CM | POA: Diagnosis not present

## 2018-09-08 DIAGNOSIS — Z1211 Encounter for screening for malignant neoplasm of colon: Secondary | ICD-10-CM | POA: Diagnosis not present

## 2018-09-08 DIAGNOSIS — Z1231 Encounter for screening mammogram for malignant neoplasm of breast: Secondary | ICD-10-CM | POA: Diagnosis not present

## 2018-09-25 IMAGING — CT CT CHEST HIGH RESOLUTION W/O CM
2 of 5 series · 15 of 36 positions shown, 18 images · non-contrast
Comparison: 02/08/2017 and 12/07/2016.

CLINICAL DATA: Interstitial lung disease, shortness of breath.

EXAM:
CT CHEST WITHOUT CONTRAST
TECHNIQUE: Multidetector CT imaging of the chest was performed following the
standard protocol without intravenous contrast. High resolution
imaging of the lungs, as well as inspiratory and expiratory imaging,
was performed.

[Series 4: thorax · axial · 0.77mm/px · z∈[-295,-33]mm · 12 of 145 slices shown, 15 images]
[im 7/145  mediastinal]
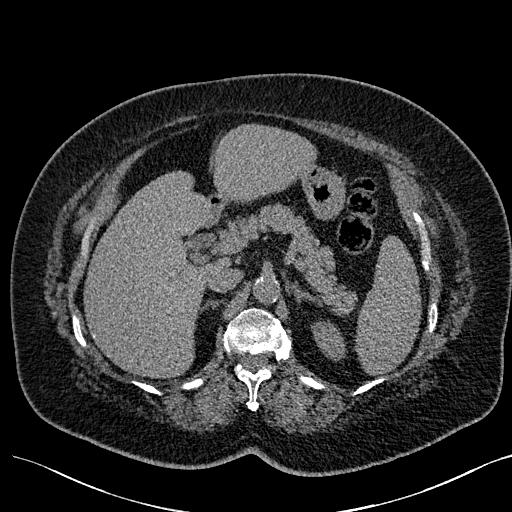
[im 7/145  lung]
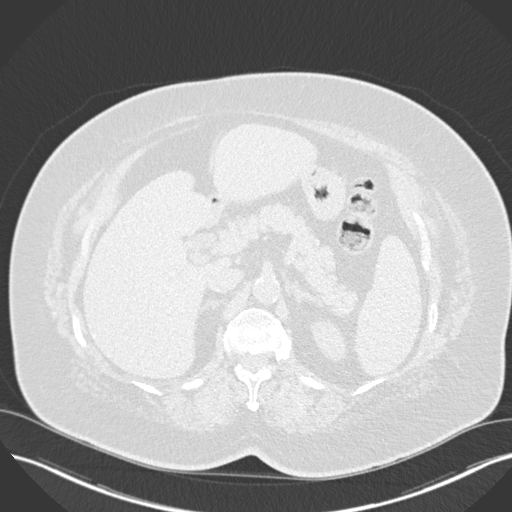
[im 21/145  lung]
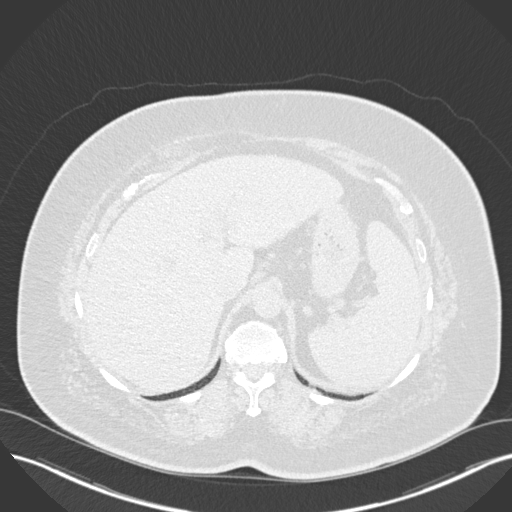
[im 35/145  lung]
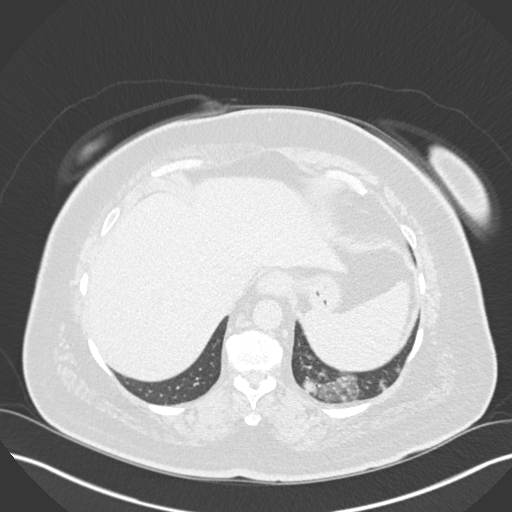
[im 42/145  lung]
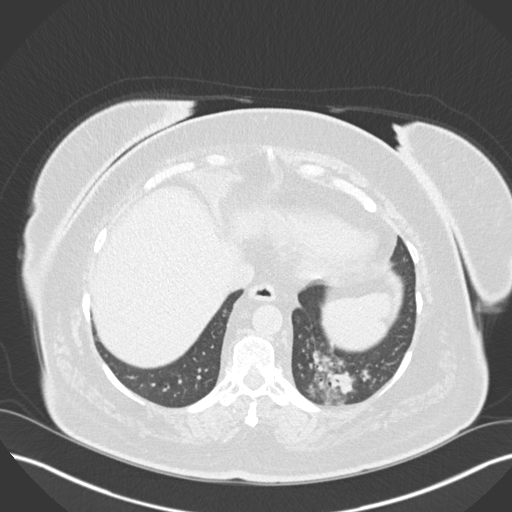
[im 55/145  mediastinal]
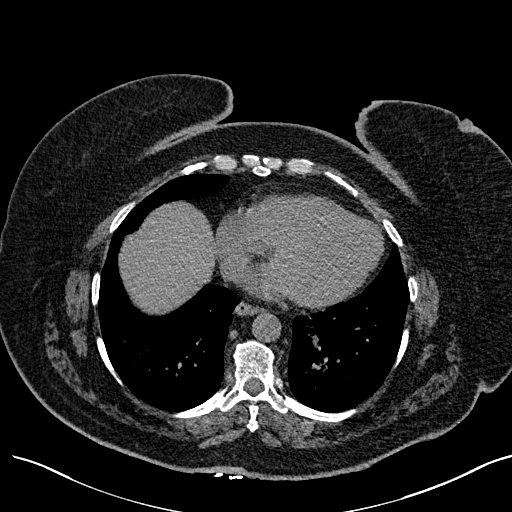
[im 55/145  lung]
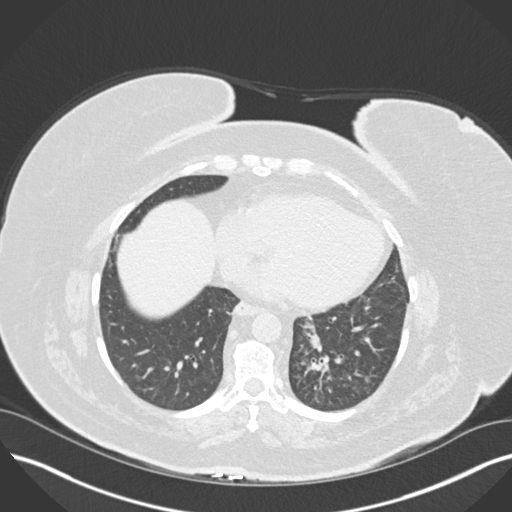
[im 69/145  lung]
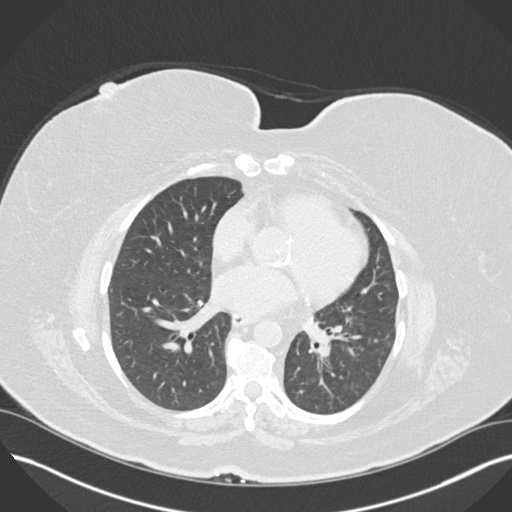
[im 76/145  lung]
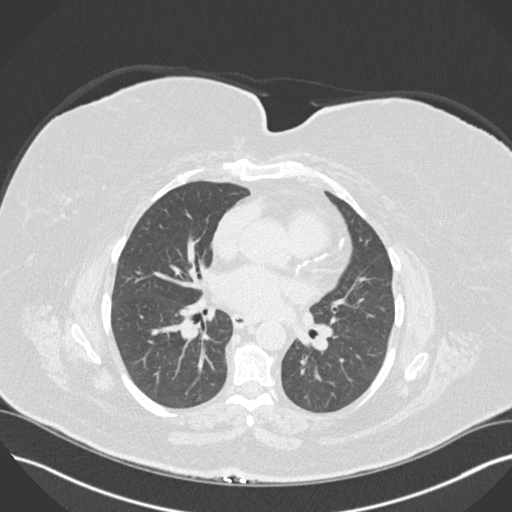
[im 90/145  lung]
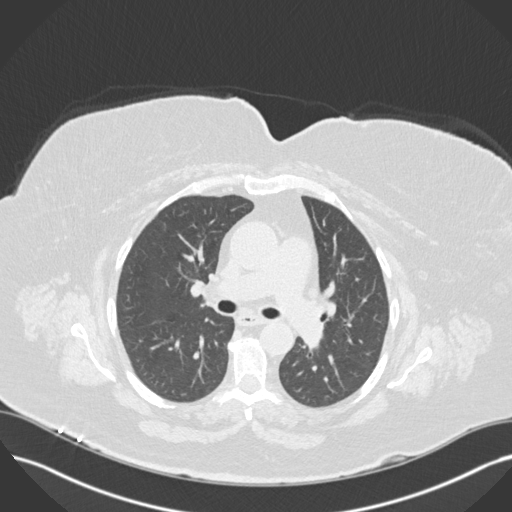
[im 103/145  mediastinal]
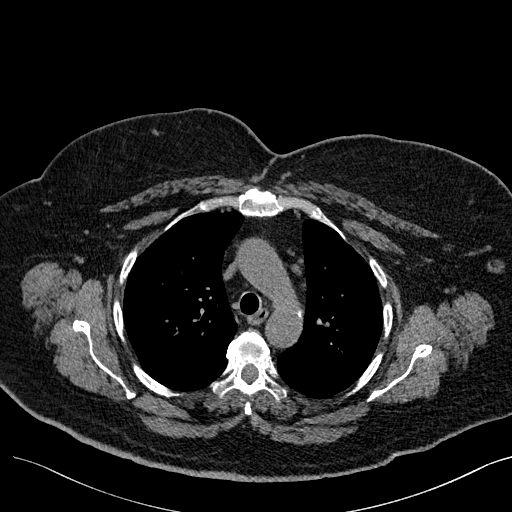
[im 103/145  lung]
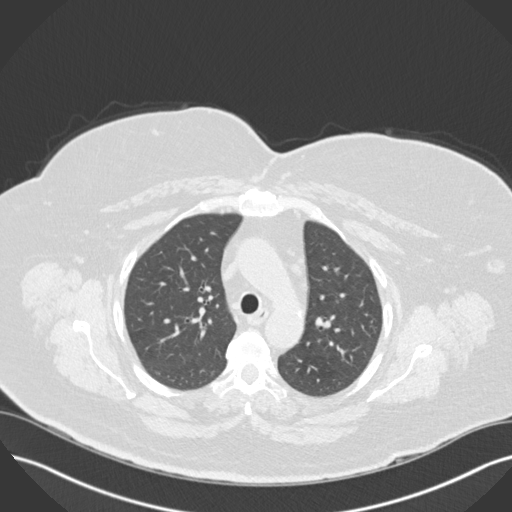
[im 110/145  lung]
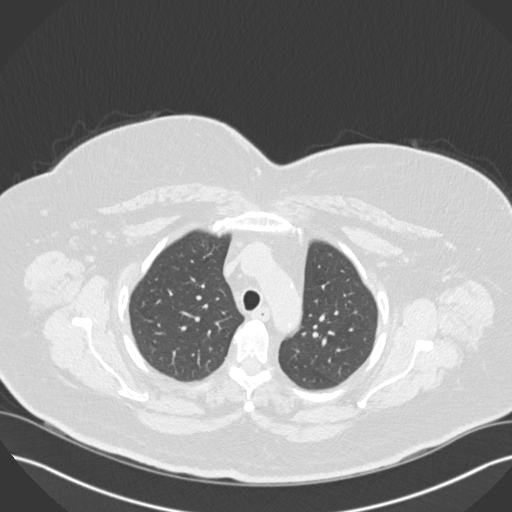
[im 124/145  lung]
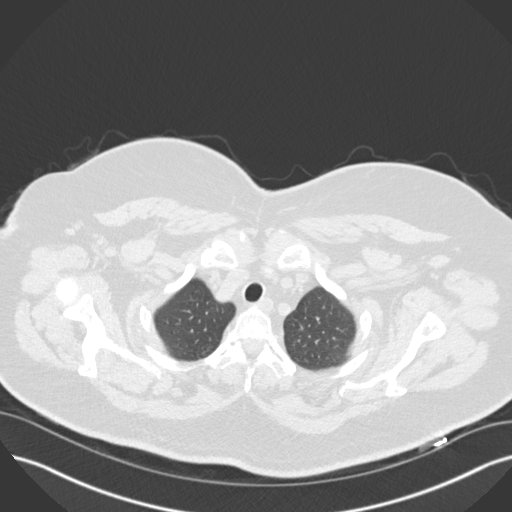
[im 138/145  lung]
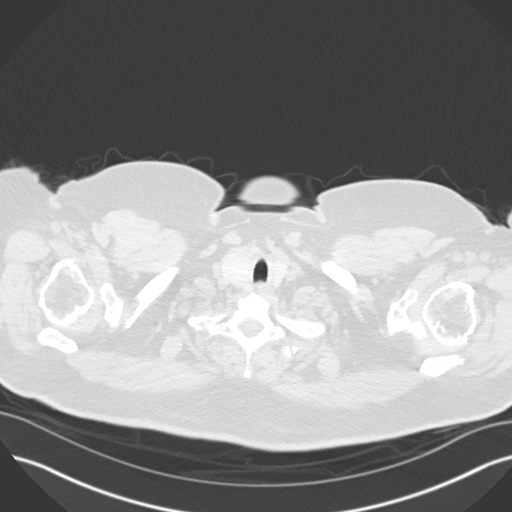

[Series 8: coronal · coronal · 0.58mm/px · 3 of 150 slices shown]
[im 30/150  lung]
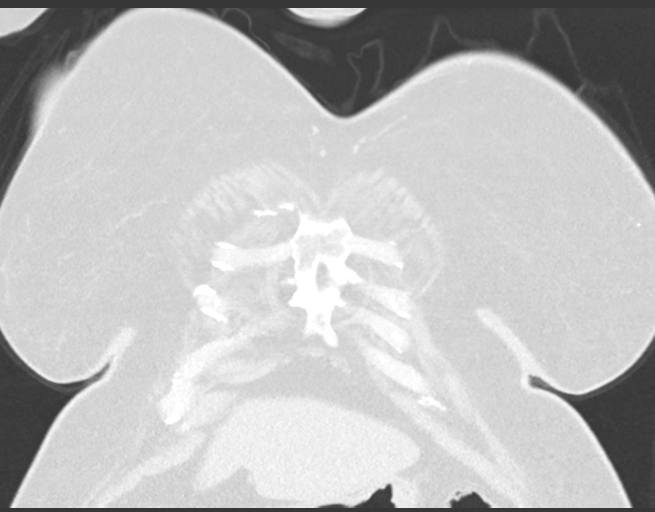
[im 60/150  lung]
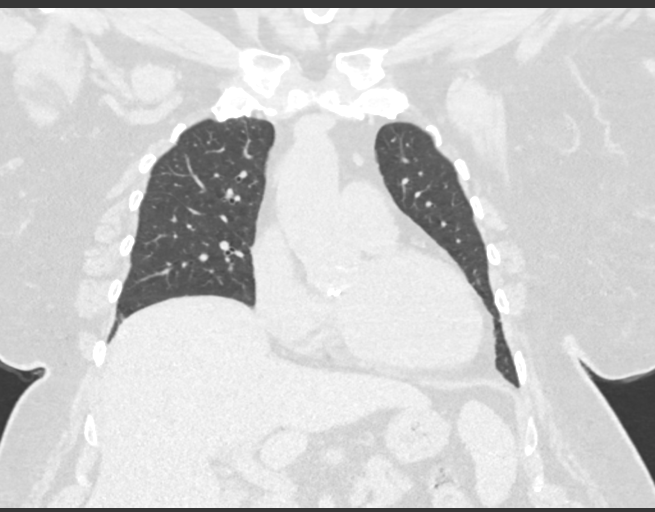
[im 90/150  lung]
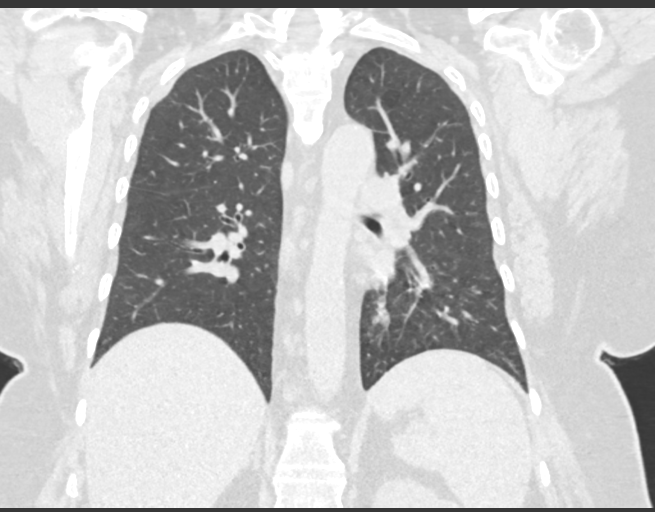

[15 of 36 positions shown; findings below may reference images not displayed]

FINDINGS: Cardiovascular: Atherosclerotic calcification of the arterial
vasculature, including coronary arteries and aortic valve. Heart is
mildly enlarged. No pericardial effusion.

Mediastinum/Nodes: Mediastinal lymph nodes are not enlarged by CT
size criteria. Hilar regions are difficult to definitively evaluate
without IV contrast but appear grossly unremarkable. No axillary
adenopathy. Esophagus is grossly unremarkable.

Lungs/Pleura: Negative for subpleural reticulation, traction
bronchiectasis/bronchiolectasis, ground-glass, architectural
distortion or honeycombing. Peribronchovascular nodularity and
consolidation in the left lower lobe. No pleural fluid. Airway is
unremarkable.

Upper Abdomen: Visualized portions of the liver, adrenal glands,
left kidney, spleen, pancreas, stomach and bowel are grossly
unremarkable. No upper abdominal adenopathy.

Musculoskeletal: Degenerative changes in the spine. No worrisome
lytic or sclerotic lesions.
IMPRESSION: 1. No evidence of interstitial lung disease.
2. Left lower lobe peribronchovascular nodularity and consolidation
are most indicative of bronchopneumonia. No centrally obstructing
mass.
3. Aortic atherosclerosis (CKLK3-170.0). Coronary artery
calcification.

## 2018-10-13 DIAGNOSIS — L400 Psoriasis vulgaris: Secondary | ICD-10-CM | POA: Diagnosis not present

## 2018-10-13 DIAGNOSIS — L4050 Arthropathic psoriasis, unspecified: Secondary | ICD-10-CM | POA: Diagnosis not present

## 2018-11-08 ENCOUNTER — Other Ambulatory Visit: Payer: Self-pay | Admitting: Cardiology

## 2018-11-08 NOTE — Telephone Encounter (Signed)
Rx refill sent to pharmacy. 

## 2018-11-16 DIAGNOSIS — E785 Hyperlipidemia, unspecified: Secondary | ICD-10-CM | POA: Diagnosis not present

## 2018-11-16 DIAGNOSIS — F411 Generalized anxiety disorder: Secondary | ICD-10-CM | POA: Diagnosis not present

## 2018-11-16 DIAGNOSIS — E039 Hypothyroidism, unspecified: Secondary | ICD-10-CM | POA: Diagnosis not present

## 2018-11-16 DIAGNOSIS — I10 Essential (primary) hypertension: Secondary | ICD-10-CM | POA: Diagnosis not present

## 2018-11-16 DIAGNOSIS — I480 Paroxysmal atrial fibrillation: Secondary | ICD-10-CM | POA: Diagnosis not present

## 2018-11-16 DIAGNOSIS — Z79899 Other long term (current) drug therapy: Secondary | ICD-10-CM | POA: Diagnosis not present

## 2018-11-16 DIAGNOSIS — E1169 Type 2 diabetes mellitus with other specified complication: Secondary | ICD-10-CM | POA: Diagnosis not present

## 2018-11-17 DIAGNOSIS — E1169 Type 2 diabetes mellitus with other specified complication: Secondary | ICD-10-CM | POA: Diagnosis not present

## 2018-11-30 ENCOUNTER — Telehealth (INDEPENDENT_AMBULATORY_CARE_PROVIDER_SITE_OTHER): Payer: PPO | Admitting: Cardiology

## 2018-11-30 ENCOUNTER — Other Ambulatory Visit: Payer: Self-pay

## 2018-11-30 ENCOUNTER — Encounter: Payer: Self-pay | Admitting: Cardiology

## 2018-11-30 VITALS — BP 132/88 | HR 68 | Ht 61.0 in | Wt 220.0 lb

## 2018-11-30 DIAGNOSIS — I10 Essential (primary) hypertension: Secondary | ICD-10-CM

## 2018-11-30 DIAGNOSIS — I480 Paroxysmal atrial fibrillation: Secondary | ICD-10-CM

## 2018-11-30 DIAGNOSIS — I7090 Unspecified atherosclerosis: Secondary | ICD-10-CM

## 2018-11-30 DIAGNOSIS — E119 Type 2 diabetes mellitus without complications: Secondary | ICD-10-CM

## 2018-11-30 NOTE — Addendum Note (Signed)
Addended by: Beckey Rutter on: 11/30/2018 11:30 AM   Modules accepted: Orders

## 2018-11-30 NOTE — Progress Notes (Signed)
Virtual Visit via Telephone Note   This visit type was conducted due to national recommendations for restrictions regarding the COVID-19 Pandemic (e.g. social distancing) in an effort to limit this patient's exposure and mitigate transmission in our community.  Due to her co-morbid illnesses, this patient is at least at moderate risk for complications without adequate follow up.  This format is felt to be most appropriate for this patient at this time.  The patient did not have access to video technology/had technical difficulties with video requiring transitioning to audio format only (telephone).  All issues noted in this document were discussed and addressed.  No physical exam could be performed with this format.  Please refer to the patient's chart for her  consent to telehealth for [facility].   Date:  11/30/2018   ID:  [name], DOB Nov 30, 1955, MRN [id number]  Patient Location: Home Provider Location: Office  PCP:  Nicoletta Dress, MD  Cardiologist:  No primary care provider on file.  Electrophysiologist:  None   Evaluation Performed:  Follow-Up Visit  Chief Complaint: Paroxysmal atrial fibrillation  History of Present Illness:    Crystal Ramos is a 63 y.o. female with dyslipidemia diabetes mellitus and paroxysmal atrial fibrillation.  She mentions to me that she is having some shortness of breath on exertion and she is feeling the same way she felt when she was first diagnosed to be in atrial fibrillation.  However she mentions to me that she does not feel that her heart is out of rhythm.  She recently saw her primary care doctor and she was told that she was fine.  EKG was not done but she had blood work.  Time  The patient does not have symptoms concerning for COVID-19 infection (fever, chills, cough, or new shortness of breath).    Past Medical History:  Diagnosis Date  . Acute diastolic heart failure ([place])   . Atrial fibrillation ([place])   . Chest pain   .  Essential hypertension    Past Surgical History:  Procedure Laterality Date  . CHOLECYSTECTOMY       Current Meds  Medication Sig  . ALPRAZolam (XANAX) 0.25 MG tablet Take 1 tablet by mouth as needed.  Marland Kitchen atorvastatin (LIPITOR) 40 MG tablet Take 40 mg by mouth daily.  . empagliflozin (JARDIANCE) 25 MG TABS tablet Take 25 mg by mouth daily.  Marland Kitchen escitalopram (LEXAPRO) 10 MG tablet Take 1 tablet by mouth daily.  . furosemide (LASIX) 40 MG tablet Take 1 tablet by mouth daily.  Marland Kitchen glimepiride (AMARYL) 4 MG tablet Take 4 mg by mouth daily with breakfast.   . levothyroxine (SYNTHROID, LEVOTHROID) 50 MCG tablet Take 50 mcg by mouth daily.  Marland Kitchen linagliptin (TRADJENTA) 5 MG TABS tablet Take 5 mg by mouth daily.   . meclizine (ANTIVERT) 25 MG tablet Take 1 tablet by mouth daily.  . metFORMIN (GLUCOPHAGE) 850 MG tablet Take 850 mg by mouth 3 (three) times daily.  . metoprolol succinate (TOPROL-XL) 100 MG 24 hr tablet Take 100 mg by mouth daily. Take with or immediately following a meal.  . PACERONE 100 MG tablet Take 1 tablet by mouth once daily  . rivaroxaban (XARELTO) 20 MG TABS tablet Take 20 mg by mouth daily with supper.     Allergies:   Patient has no known allergies.   Social History   Tobacco Use  . Smoking status: Passive Smoke Exposure - Never Smoker  . Smokeless tobacco: Never Used  .  Tobacco comment: "whole family" smoked in the house  Substance Use Topics  . Alcohol use: No  . Drug use: No     Family Hx: The patient's family history includes Breast cancer in her paternal aunt; Diabetes in her mother; Stroke in her father and paternal aunt.  ROS:   Please see the history of present illness.    As mentioned above All other systems reviewed and are negative.   Prior CV studies:   The following studies were reviewed today:  None  Labs/Other Tests and Data Reviewed:    EKG:  No ECG reviewed.  Recent Labs: 05/31/2018: TSH 0.181   Recent Lipid Panel Lab Results   Component Value Date/Time   CHOL 168 06/07/2017 09:12 AM   TRIG 107 06/07/2017 09:12 AM   HDL 60 06/07/2017 09:12 AM   CHOLHDL 2.8 06/07/2017 09:12 AM   LDLCALC 87 06/07/2017 09:12 AM    Wt Readings from Last 3 Encounters:  11/30/18 220 lb (99.8 kg)  08/26/18 212 lb (96.2 kg)  05/31/18 212 lb (96.2 kg)     Objective:    Vital Signs:  BP 132/88 (BP Location: Left Arm, Patient Position: Sitting, Cuff Size: Normal)   Pulse 68   Ht 5\' 1"  (1.549 m)   Wt 220 lb (99.8 kg)   BMI 41.57 kg/m    VITAL SIGNS:  reviewed  ASSESSMENT & PLAN:    1. Paroxysmal atrial fibrillation: Patient symptoms are concerning.  I will get her in next week for an EKG to her office.  I will also get a copy of all blood work from primary care physician.  We will evaluate this.  If she is in atrial fibrillation then we will decide accordingly.I discussed with the patient atrial fibrillation, disease process. Management and therapy including rate and rhythm control, anticoagulation benefits and potential risks were discussed extensively with the patient. Patient had multiple questions which were answered to patient's satisfaction. 2. If we find her to be in sinus rhythm with the above symptoms she might need assessment for coronary artery disease.  I discussed this with her at extensive length and she is agreeable.  At the time of my evaluation, the patient is alert awake oriented and in no distress. 3. Essential hypertension: Blood pressure stable 4. Mixed dyslipidemia: Diet was discussed. 5. Follow-up appointment in a month or earlier if she has any concerns.  She knows to go to the nearest emergency room for any concerning symptoms.  COVID-19 Education: The signs and symptoms of COVID-19 were discussed with the patient and how to seek care for testing (follow up with PCP or arrange E-visit).  The importance of social distancing was discussed today.  Time:   Today, I have spent 12 minutes with the patient with  telehealth technology discussing the above problems.     Medication Adjustments/Labs and Tests Ordered: Current medicines are reviewed at length with the patient today.  Concerns regarding medicines are outlined above.   Tests Ordered: No orders of the defined types were placed in this encounter.   Medication Changes: No orders of the defined types were placed in this encounter.   Follow Up:  In Person 1 mo  Signed, Jenean Lindau, MD  11/30/2018 9:33 AM    [place]

## 2018-11-30 NOTE — Patient Instructions (Signed)
Medication Instructions:  Your physician recommends that you continue on your current medications as directed. Please refer to the Current Medication list given to you today.  If you need a refill on your cardiac medications before your next appointment, please call your pharmacy.   Lab work: NONE If you have labs (blood work) drawn today and your tests are completely normal, you will receive your results only by: Marland Kitchen MyChart Message (if you have MyChart) OR . A paper copy in the mail If you have any lab test that is abnormal or we need to change your treatment, we will call you to review the results.  Testing/Procedures: You will come in on                                  For an EKG to be performed.   Follow-Up: At [facility], you and your health needs are our priority.  As part of our continuing mission to provide you with exceptional heart care, we have created designated Provider Care Teams.  These Care Teams include your primary Cardiologist (physician) and Advanced Practice Providers (APPs -  Physician Assistants and Nurse Practitioners) who all work together to provide you with the care you need, when you need it. You will need a follow up appointment in 1 weeks.

## 2018-12-05 ENCOUNTER — Ambulatory Visit: Payer: PPO | Admitting: Cardiology

## 2018-12-16 ENCOUNTER — Other Ambulatory Visit: Payer: Self-pay | Admitting: Cardiology

## 2018-12-16 DIAGNOSIS — I480 Paroxysmal atrial fibrillation: Secondary | ICD-10-CM

## 2018-12-19 NOTE — Telephone Encounter (Signed)
°*  STAT* If patient is at the pharmacy, call can be transferred to refill team.   1. Which medications need to be refilled? (please list name of each medication and dose if known) Amiodarone  2. Which pharmacy/location (including street and city if local pharmacy) is medication to be sent to? [facility] on [address] [place]  3. Do they need a 30 day or 90 day supply? [place]

## 2019-01-04 DIAGNOSIS — L02211 Cutaneous abscess of abdominal wall: Secondary | ICD-10-CM | POA: Diagnosis not present

## 2019-01-04 DIAGNOSIS — L02219 Cutaneous abscess of trunk, unspecified: Secondary | ICD-10-CM | POA: Diagnosis not present

## 2019-01-05 ENCOUNTER — Other Ambulatory Visit: Payer: Self-pay

## 2019-01-05 ENCOUNTER — Encounter: Payer: Self-pay | Admitting: Cardiology

## 2019-01-05 ENCOUNTER — Ambulatory Visit (INDEPENDENT_AMBULATORY_CARE_PROVIDER_SITE_OTHER): Payer: PPO | Admitting: Cardiology

## 2019-01-05 VITALS — BP 130/100 | HR 96 | Ht 61.0 in | Wt 219.0 lb

## 2019-01-05 DIAGNOSIS — E119 Type 2 diabetes mellitus without complications: Secondary | ICD-10-CM

## 2019-01-05 DIAGNOSIS — E785 Hyperlipidemia, unspecified: Secondary | ICD-10-CM

## 2019-01-05 DIAGNOSIS — I480 Paroxysmal atrial fibrillation: Secondary | ICD-10-CM | POA: Diagnosis not present

## 2019-01-05 DIAGNOSIS — R0989 Other specified symptoms and signs involving the circulatory and respiratory systems: Secondary | ICD-10-CM

## 2019-01-05 HISTORY — DX: Other specified symptoms and signs involving the circulatory and respiratory systems: R09.89

## 2019-01-05 MED ORDER — AMIODARONE HCL 200 MG PO TABS: 200.0000 mg | ORAL_TABLET | Freq: Every day | ORAL | 1 refills | Status: DC

## 2019-01-05 NOTE — Patient Instructions (Signed)
Medication Instructions:  Your physician has recommended you make the following change in your medication: TAKE amiodarone 400 mg (2 tablets) today  Start taking amiodarone 200 mg (1 tablet) once daily starting tomorrow  If you need a refill on your cardiac medications before your next appointment, please call your pharmacy.   Lab work: Your physician recommends that you have hepatic and BMP drawn today. If you have labs (blood work) drawn today and your tests are completely normal, you will receive your results only by: Marland Kitchen MyChart Message (if you have MyChart) OR . A paper copy in the mail If you have any lab test that is abnormal or we need to change your treatment, we will call you to review the results.  Testing/Procedures: You had an EKG performed today  Your physician has requested that you have a carotid duplex. This test is an ultrasound of the carotid arteries in your neck. It looks at blood flow through these arteries that supply the brain with blood. Allow one hour for this exam. There are no restrictions or special instructions.   Follow-Up: At [facility], you and your health needs are our priority.  As part of our continuing mission to provide you with exceptional heart care, we have created designated Provider Care Teams.  These Care Teams include your primary Cardiologist (physician) and Advanced Practice Providers (APPs -  Physician Assistants and Nurse Practitioners) who all work together to provide you with the care you need, when you need it. You will need a follow up appointment in 2 weeks.    Any Other Special Instructions Will Be Listed Below  Vascular Ultrasound A vascular ultrasound is a painless test that is done to see if you have blood flow problems or clots in your blood vessels. It uses harmless sound waves to take pictures of the arteries and veins in your body. The pictures are taken by passing a device (transducer) over certain areas of your body. Tell a  health care provider about:  Any allergies you have.  All medicines you are taking, including vitamins, herbs, eye drops, creams, and over-the-counter medicines.  Any blood disorders you have.  Any surgeries you have had.  Any medical conditions you have.  Whether you are pregnant or may be pregnant. What are the risks? Generally, this is a safe procedure. There are no known risks or complications that arise from having an ultrasound. What happens before the procedure?  If the ultrasound scan involves your upper abdomen, you may be told not to eat or chew gum the morning of your exam. Follow your health care provider's instructions.  Do not smoke or use nicotine products at least 30 minutes before the exam.  During the test, a gel will be applied to your skin. What happens during the procedure?   A gel will be applied to your skin. It may feel cool.  The transducer will be placed on the area to be examined.  Pictures will be taken. They will be displayed on one or more monitors that look like small television screens. What happens after the procedure?  You can safely drive home immediately after your exam.  You may resume your normal diet and activities.  Keep all follow-up visits as told by your health care provider. This is important.  It is up to you to get your test results. Ask your health care provider, or the department that is doing the test: ? When will my results be ready? ? How will I  get my results? ? What are my treatment options? ? What other tests do I need? ? What are my next steps? Summary  A vascular ultrasound is a painless test that is done to see if you have blood flow problems or clots in your blood vessels. It uses harmless sound waves to take pictures of the arteries and veins in your body.  Generally, this is a safe procedure. There are no known risks or complications that arise from having an ultrasound.  A gel will be applied to your skin.  It may feel cool. The device that takes the pictures (transducer) will then be placed on the area to be examined.  It is up to you to get your test results. Ask your health care provider or the department that is doing the test when your results will be ready and how you will get your results. This information is not intended to replace advice given to you by your health care provider. Make sure you discuss any questions you have with your health care provider. Document Released: 05/15/2004 Document Revised: 08/23/2018 Document Reviewed: 06/10/2017 Elsevier Patient Education  2020 [employer].

## 2019-01-05 NOTE — Progress Notes (Signed)
Cardiology Office Note:    Date:  01/05/2019   ID:  Crystal Ramos, DOB 19-Aug-1955, MRN [id number]  PCP:  Nicoletta Dress, MD  Cardiologist:  Jenean Lindau, MD   Referring MD: Nicoletta Dress, MD    ASSESSMENT:    1. Paroxysmal atrial fibrillation (HCC)   2. Morbid obesity ([place])   3. Dyslipidemia   4. Type 2 diabetes mellitus without complication, without long-term current use of insulin ([place])   5. Bilateral carotid bruits    PLAN:    In order of problems listed above:  Atrial fibrillation/flutter:I discussed with the patient atrial fibrillation, disease process. Management and therapy including rate and rhythm control, anticoagulation benefits and potential risks were discussed extensively with the patient. Patient had multiple questions which were answered to patient's satisfaction.She is taking her medications very regularly including anticoagulation.  I gave her option of cardioversion but she prefers medical therapy.  I have increased her amiodarone to 200 mg daily.  Tomorrow is the only day she will take 400 mg.  She will have a Chem-7 TSH and liver panel today.  She will be seen in follow-up appointment in 2 to 3 weeks or earlier if she has any concerns.  Essential hypertension: Blood pressure stable  Diabetes mellitus and obesity: Diet was explored importance of compliance stressed.  Weight reduction was stressed.  Risks of obesity explained and she vocalized understanding.  Mixed dyslipidemia: Lipids followed by primary physician and she is on statin therapy.  She knows to go to the nearest emergency room for any concerning symptoms.   Medication Adjustments/Labs and Tests Ordered: Current medicines are reviewed at length with the patient today.  Concerns regarding medicines are outlined above.  No orders of the defined types were placed in this encounter.  No orders of the defined types were placed in this encounter.    Chief Complaint  Patient presents  with  . Follow-up     History of Present Illness:    Crystal Ramos is a 63 y.o. female.  Patient has past medical history of essential hypertension, dyslipidemia, diabetes mellitus and obesity.  She has approximately fibrillation and flutter.  She mentions that in the past 2 weeks she is having some shortness of breath on exertion.  No chest pain orthopnea or PND.  At the time of my evaluation, the patient is alert awake oriented and in no distress.  Past Medical History:  Diagnosis Date  . Acute diastolic heart failure ([place])   . Atrial fibrillation ([place])   . Chest pain   . Essential hypertension     Past Surgical History:  Procedure Laterality Date  . CHOLECYSTECTOMY      Current Medications: Current Meds  Medication Sig  . ALPRAZolam (XANAX) 0.25 MG tablet Take 1 tablet by mouth as needed.  Marland Kitchen amiodarone (PACERONE) 200 MG tablet Take 1 tablet by mouth once daily  . atorvastatin (LIPITOR) 40 MG tablet Take 40 mg by mouth daily.  . clindamycin (CLEOCIN) 300 MG capsule 300 mg 3 (three) times daily.  . empagliflozin (JARDIANCE) 25 MG TABS tablet Take 25 mg by mouth daily.  Marland Kitchen escitalopram (LEXAPRO) 10 MG tablet Take 1 tablet by mouth daily.  . furosemide (LASIX) 40 MG tablet Take 1 tablet by mouth daily.  Marland Kitchen glimepiride (AMARYL) 4 MG tablet Take 4 mg by mouth daily with breakfast.   . levothyroxine (SYNTHROID, LEVOTHROID) 50 MCG tablet Take 50 mcg by mouth daily.  Marland Kitchen linagliptin (TRADJENTA) 5 MG  TABS tablet Take 5 mg by mouth daily.   . meclizine (ANTIVERT) 25 MG tablet Take 1 tablet by mouth daily.  . metFORMIN (GLUCOPHAGE) 850 MG tablet Take 850 mg by mouth 3 (three) times daily.  . metoprolol succinate (TOPROL-XL) 100 MG 24 hr tablet Take 100 mg by mouth daily. Take with or immediately following a meal.  . PACERONE 100 MG tablet Take 1 tablet by mouth once daily  . rivaroxaban (XARELTO) 20 MG TABS tablet Take 20 mg by mouth daily with supper.     Allergies:   Patient has no  known allergies.   Social History   Socioeconomic History  . Marital status: Married    Spouse name: Not on file  . Number of children: Not on file  . Years of education: Not on file  . Highest education level: Not on file  Occupational History  . Not on file  Social Needs  . Financial resource strain: Not on file  . Food insecurity    Worry: Not on file    Inability: Not on file  . Transportation needs    Medical: Not on file    Non-medical: Not on file  Tobacco Use  . Smoking status: Passive Smoke Exposure - Never Smoker  . Smokeless tobacco: Never Used  . Tobacco comment: "whole family" smoked in the house  Substance and Sexual Activity  . Alcohol use: No  . Drug use: No  . Sexual activity: Not on file  Lifestyle  . Physical activity    Days per week: Not on file    Minutes per session: Not on file  . Stress: Not on file  Relationships  . Social Herbalist on phone: Not on file    Gets together: Not on file    Attends religious service: Not on file    Active member of club or organization: Not on file    Attends meetings of clubs or organizations: Not on file    Relationship status: Not on file  Other Topics Concern  . Not on file  Social History Narrative  . Not on file     Family History: The patient's family history includes Breast cancer in her paternal aunt; Diabetes in her mother; Stroke in her father and paternal aunt.  ROS:   Please see the history of present illness.    All other systems reviewed and are negative.  EKGs/Labs/Other Studies Reviewed:    The following studies were reviewed today: EKG reveals atrial flutter/fibrillation and nonspecific ST changes   Recent Labs: 05/31/2018: TSH 0.181  Recent Lipid Panel    Component Value Date/Time   CHOL 168 06/07/2017 0912   TRIG 107 06/07/2017 0912   HDL 60 06/07/2017 0912   CHOLHDL 2.8 06/07/2017 0912   LDLCALC 87 06/07/2017 0912    Physical Exam:    VS:  BP (!) 130/100 (BP  Location: Left Arm, Patient Position: Sitting, Cuff Size: Normal)   Pulse 96   Ht 5\' 1"  (1.549 m)   Wt 219 lb (99.3 kg)   BMI 41.38 kg/m     Wt Readings from Last 3 Encounters:  01/05/19 219 lb (99.3 kg)  11/30/18 220 lb (99.8 kg)  08/26/18 212 lb (96.2 kg)     GEN: Patient is in no acute distress HEENT: Normal NECK: No JVD; No carotid bruits LYMPHATICS: No lymphadenopathy CARDIAC: Hear sounds regular, 2/6 systolic murmur at the apex. RESPIRATORY:  Clear to auscultation without rales, wheezing or rhonchi  ABDOMEN: Soft, non-tender, non-distended MUSCULOSKELETAL:  No edema; No deformity  SKIN: Warm and dry NEUROLOGIC:  Alert and oriented x 3 PSYCHIATRIC:  Normal affect   Signed, Jenean Lindau, MD  01/05/2019 3:52 PM    [facility]

## 2019-01-06 ENCOUNTER — Encounter: Payer: Self-pay | Admitting: *Deleted

## 2019-01-06 LAB — BASIC METABOLIC PANEL
BUN/Creatinine Ratio: 16 (ref 12–28)
BUN: 12 mg/dL (ref 8–27)
CO2: 23 mmol/L (ref 20–29)
Calcium: 8.8 mg/dL (ref 8.7–10.3)
Chloride: 102 mmol/L (ref 96–106)
Creatinine, Ser: 0.74 mg/dL (ref 0.57–1.00)
GFR calc Af Amer: 98 mL/min/{1.73_m2} (ref >59)
GFR calc non Af Amer: 85 mL/min/{1.73_m2} (ref >59)
Glucose: 106 mg/dL — ABNORMAL HIGH (ref 65–99)
Potassium: 3.6 mmol/L (ref 3.5–5.2)
Sodium: 140 mmol/L (ref 134–144)

## 2019-01-06 LAB — HEPATIC FUNCTION PANEL
ALT: 15 IU/L (ref 0–32)
AST: 22 IU/L (ref 0–40)
Albumin: 3.7 g/dL — ABNORMAL LOW (ref 3.8–4.8)
Alkaline Phosphatase: 119 IU/L — ABNORMAL HIGH (ref 39–117)
Bilirubin Total: 0.7 mg/dL (ref 0.0–1.2)
Bilirubin, Direct: 0.28 mg/dL (ref 0.00–0.40)
Total Protein: 7 g/dL (ref 6.0–8.5)

## 2019-01-09 DIAGNOSIS — K651 Peritoneal abscess: Secondary | ICD-10-CM | POA: Diagnosis not present

## 2019-01-09 DIAGNOSIS — L02211 Cutaneous abscess of abdominal wall: Secondary | ICD-10-CM | POA: Diagnosis not present

## 2019-01-10 DIAGNOSIS — L02211 Cutaneous abscess of abdominal wall: Secondary | ICD-10-CM | POA: Diagnosis not present

## 2019-01-16 ENCOUNTER — Ambulatory Visit: Payer: PPO | Admitting: Cardiology

## 2019-01-17 DIAGNOSIS — L02211 Cutaneous abscess of abdominal wall: Secondary | ICD-10-CM | POA: Diagnosis not present

## 2019-01-18 ENCOUNTER — Other Ambulatory Visit: Payer: Self-pay

## 2019-01-18 ENCOUNTER — Ambulatory Visit (INDEPENDENT_AMBULATORY_CARE_PROVIDER_SITE_OTHER): Payer: PPO

## 2019-01-18 DIAGNOSIS — R0989 Other specified symptoms and signs involving the circulatory and respiratory systems: Secondary | ICD-10-CM | POA: Diagnosis not present

## 2019-01-18 NOTE — Progress Notes (Addendum)
Carotid duplex exam has been performed. 1-39% stenosis bilaterally   Jimmy Ishmel Acevedo RDCS,RVT

## 2019-01-25 ENCOUNTER — Telehealth: Payer: Self-pay | Admitting: Cardiology

## 2019-01-25 DIAGNOSIS — L400 Psoriasis vulgaris: Secondary | ICD-10-CM | POA: Diagnosis not present

## 2019-01-25 NOTE — Telephone Encounter (Signed)
-----   Message from Jenean Lindau, MD sent at 01/24/2019  9:47 AM EDT ----- The results of the study is unremarkable. Please inform patient. I will discuss in detail at next appointment. Cc  primary care/referring physician Jenean Lindau, MD 01/24/2019 9:47 AM

## 2019-01-25 NOTE — Telephone Encounter (Signed)
Please call patent with results of Carotid please

## 2019-01-25 NOTE — Telephone Encounter (Signed)
Results relayed, copy sent to Dr. Delena Bali per Dr. Docia Furl request.

## 2019-01-31 DIAGNOSIS — R531 Weakness: Secondary | ICD-10-CM | POA: Diagnosis not present

## 2019-01-31 DIAGNOSIS — L400 Psoriasis vulgaris: Secondary | ICD-10-CM | POA: Diagnosis not present

## 2019-02-03 DIAGNOSIS — L02211 Cutaneous abscess of abdominal wall: Secondary | ICD-10-CM | POA: Diagnosis not present

## 2019-02-16 DIAGNOSIS — F411 Generalized anxiety disorder: Secondary | ICD-10-CM | POA: Diagnosis not present

## 2019-02-16 DIAGNOSIS — Z6839 Body mass index (BMI) 39.0-39.9, adult: Secondary | ICD-10-CM | POA: Diagnosis not present

## 2019-02-16 DIAGNOSIS — E1169 Type 2 diabetes mellitus with other specified complication: Secondary | ICD-10-CM | POA: Diagnosis not present

## 2019-02-16 DIAGNOSIS — Z79899 Other long term (current) drug therapy: Secondary | ICD-10-CM | POA: Diagnosis not present

## 2019-02-16 DIAGNOSIS — Z23 Encounter for immunization: Secondary | ICD-10-CM | POA: Diagnosis not present

## 2019-02-16 DIAGNOSIS — Z1231 Encounter for screening mammogram for malignant neoplasm of breast: Secondary | ICD-10-CM | POA: Diagnosis not present

## 2019-02-16 DIAGNOSIS — I10 Essential (primary) hypertension: Secondary | ICD-10-CM | POA: Diagnosis not present

## 2019-02-16 DIAGNOSIS — E785 Hyperlipidemia, unspecified: Secondary | ICD-10-CM | POA: Diagnosis not present

## 2019-02-16 DIAGNOSIS — I480 Paroxysmal atrial fibrillation: Secondary | ICD-10-CM | POA: Diagnosis not present

## 2019-02-16 DIAGNOSIS — E039 Hypothyroidism, unspecified: Secondary | ICD-10-CM | POA: Diagnosis not present

## 2019-02-24 ENCOUNTER — Encounter: Payer: Self-pay | Admitting: Cardiology

## 2019-02-24 ENCOUNTER — Ambulatory Visit (INDEPENDENT_AMBULATORY_CARE_PROVIDER_SITE_OTHER): Payer: PPO | Admitting: Cardiology

## 2019-02-24 ENCOUNTER — Other Ambulatory Visit: Payer: Self-pay

## 2019-02-24 VITALS — BP 122/80 | HR 116 | Ht 62.0 in | Wt 213.4 lb

## 2019-02-24 DIAGNOSIS — E785 Hyperlipidemia, unspecified: Secondary | ICD-10-CM | POA: Diagnosis not present

## 2019-02-24 DIAGNOSIS — I480 Paroxysmal atrial fibrillation: Secondary | ICD-10-CM | POA: Diagnosis not present

## 2019-02-24 DIAGNOSIS — I10 Essential (primary) hypertension: Secondary | ICD-10-CM | POA: Diagnosis not present

## 2019-02-24 DIAGNOSIS — I7090 Unspecified atherosclerosis: Secondary | ICD-10-CM | POA: Diagnosis not present

## 2019-02-24 DIAGNOSIS — E119 Type 2 diabetes mellitus without complications: Secondary | ICD-10-CM

## 2019-02-24 MED ORDER — AMIODARONE HCL 200 MG PO TABS: 200.0000 mg | ORAL_TABLET | Freq: Every day | ORAL | 2 refills | Status: DC

## 2019-02-24 NOTE — Patient Instructions (Addendum)
Medication Instructions:  Your physician has recommended you make the following change in your medication:   INCREASE amiodarone to 200 mg per day   If you need a refill on your cardiac medications before your next appointment, please call your pharmacy.   Lab work: Your physician recommends that you have a BMP and magnesium drawn today If you have labs (blood work) drawn today and your tests are completely normal, you will receive your results only by: Marland Kitchen MyChart Message (if you have MyChart) OR . A paper copy in the mail If you have any lab test that is abnormal or we need to change your treatment, we will call you to review the results.  Testing/Procedures: Dear Crystal Ramos You are scheduled for a TEE/Cardioversion/TEE Cardioversion on 03/01/19 with Dr. Geraldo Pitter.  Please arrive at the outpatient clinic at 0700 am. (1 hour prior to procedure unless lab work is needed; if lab work is needed arrive 1.5 hours ahead)  DIET: Nothing to eat or drink after midnight except a sip of water with medications (see medication instructions below)  Medication Instructions: Hold metoprolol and amiodarone day of procedure  Continue your anticoagulant: Xarelto You will need to continue your anticoagulant after your procedure until you  are told by your  Provider that it is safe to stop   Labs: done today  You must have a responsible person to drive you home and stay in the waiting area during your procedure. Failure to do so could result in cancellation.  Bring your insurance cards.  *Special Note: Every effort is made to have your procedure done on time. Occasionally there are emergencies that occur at the hospital that may cause delays. Please be patient if a delay does occur.   Follow-Up: At [facility], you and your health needs are our priority.  As part of our continuing mission to provide you with exceptional heart care, we have created designated Provider Care Teams.  These Care Teams  include your primary Cardiologist (physician) and Advanced Practice Providers (APPs -  Physician Assistants and Nurse Practitioners) who all work together to provide you with the care you need, when you need it. You will need a follow up appointment in 1 months.   Any Other Special Instructions Will Be Listed Below Amiodarone tablets What is this medicine? AMIODARONE (a MEE oh da rone) is an antiarrhythmic drug. It helps make your heart beat regularly. Because of the side effects caused by this medicine, it is only used when other medicines have not worked. It is usually used for heartbeat problems that may be life threatening. This medicine may be used for other purposes; ask your health care provider or pharmacist if you have questions. COMMON BRAND NAME(S): Cordarone, Pacerone What should I tell my health care provider before I take this medicine? They need to know if you have any of these conditions:  liver disease  lung disease  other heart problems  thyroid disease  an unusual or allergic reaction to amiodarone, iodine, other medicines, foods, dyes, or preservatives  pregnant or trying to get pregnant  breast-feeding How should I use this medicine? Take this medicine by mouth with a glass of water. Follow the directions on the prescription label. You can take this medicine with or without food. However, you should always take it the same way each time. Take your doses at regular intervals. Do not take your medicine more often than directed. Do not stop taking except on the advice of your doctor or  health care professional. A special MedGuide will be given to you by the pharmacist with each prescription and refill. Be sure to read this information carefully each time. Talk to your pediatrician regarding the use of this medicine in children. Special care may be needed. Overdosage: If you think you have taken too much of this medicine contact a poison control center or emergency room  at once. NOTE: This medicine is only for you. Do not share this medicine with others. What if I miss a dose? If you miss a dose, take it as soon as you can. If it is almost time for your next dose, take only that dose. Do not take double or extra doses. What may interact with this medicine? Do not take this medicine with any of the following medications:  abarelix  apomorphine  arsenic trioxide  certain antibiotics like erythromycin, gemifloxacin, levofloxacin, pentamidine  certain medicines for depression like amoxapine, tricyclic antidepressants  certain medicines for fungal infections like fluconazole, itraconazole, ketoconazole, posaconazole, voriconazole  certain medicines for irregular heart beat like disopyramide, dronedarone, ibutilide, propafenone, sotalol  certain medicines for malaria like chloroquine, halofantrine  cisapride  droperidol  haloperidol  haw[place]orn  maprotiline  methadone  phenothiazines like chlorpromazine, mesoridazine, thioridazine  pimozide  ranolazine  red yeast rice  vardenafil This medicine may also interact with the following medications:  antiviral medicines for HIV or AIDS  certain medicines for blood pressure, heart disease, irregular heart beat  certain medicines for cholesterol like atorvastatin, cerivastatin, lovastatin, simvastatin  certain medicines for hepatitis C like sofosbuvir and ledipasvir; sofosbuvir  certain medicines for seizures like phenytoin  certain medicines for thyroid problems  certain medicines that treat or prevent blood clots like warfarin  cholestyramine  cimetidine  clopidogrel  cyclosporine  dextromethorphan  diuretics  dofetilide  fentanyl  general anesthetics  grapefruit juice  lidocaine  loratadine  methotrexate  other medicines that prolong the QT interval (cause an abnormal heart rhythm)  procainamide  quinidine  rifabutin, rifampin, or rifapentine  St.  John's Wort  trazodone  ziprasidone This list may not describe all possible interactions. Give your health care provider a list of all the medicines, herbs, non-prescription drugs, or dietary supplements you use. Also tell them if you smoke, drink alcohol, or use illegal drugs. Some items may interact with your medicine. What should I watch for while using this medicine? Your condition will be monitored closely when you first begin therapy. Often, this drug is first started in a hospital or other monitored health care setting. Once you are on maintenance therapy, visit your doctor or health care professional for regular checks on your progress. Because your condition and use of this medicine carry some risk, it is a good idea to carry an identification card, necklace or bracelet with details of your condition, medications, and doctor or health care professional. Dennis Bast may get drowsy or dizzy. Do not drive, use machinery, or do anything that needs mental alertness until you know how this medicine affects you. Do not stand or sit up quickly, especially if you are an older patient. This reduces the risk of dizzy or fainting spells. This medicine can make you more sensitive to the sun. Keep out of the sun. If you cannot avoid being in the sun, wear protective clothing and use sunscreen. Do not use sun lamps or tanning beds/booths. You should have regular eye exams before and during treatment. Call your doctor if you have blurred vision, see halos, or your eyes become sensitive  to light. Your eyes may get dry. It may be helpful to use a lubricating eye solution or artificial tears solution. If you are going to have surgery or a procedure that requires contrast dyes, tell your doctor or health care professional that you are taking this medicine. What side effects may I notice from receiving this medicine? Side effects that you should report to your doctor or health care professional as soon as  possible:  allergic reactions like skin rash, itching or hives, swelling of the face, lips, or tongue  blue-gray coloring of the skin  blurred vision, seeing blue green halos, increased sensitivity of the eyes to light  breathing problems  chest pain  dark urine  fast, irregular heartbeat  feeling faint or light-headed  intolerance to heat or cold  nausea or vomiting  pain and swelling of the scrotum  pain, tingling, numbness in feet, hands  redness, blistering, peeling or loosening of the skin, including inside the mouth  spitting up blood  stomach pain  sweating  unusual or uncontrolled movements of body  unusually weak or tired  weight gain or loss  yellowing of the eyes or skin Side effects that usually do not require medical attention (report to your doctor or health care professional if they continue or are bothersome):  change in sex drive or performance  constipation  dizziness  headache  loss of appetite  trouble sleeping This list may not describe all possible side effects. Call your doctor for medical advice about side effects. You may report side effects to FDA at 1-800-FDA-1088. Where should I keep my medicine? Keep out of the reach of children. Store at room temperature between 20 and 25 degrees C (68 and 77 degrees F). Protect from light. Keep container tightly closed. Throw away any unused medicine after the expiration date. NOTE: This sheet is a summary. It may not cover all possible information. If you have questions about this medicine, talk to your doctor, pharmacist, or health care provider.  2020 Elsevier/Gold Standard (2018-04-06 13:44:04)

## 2019-02-24 NOTE — Progress Notes (Signed)
Cardiology Office Note:    Date:  02/24/2019   ID:  Crystal Ramos, DOB 12/22/1955, MRN [id number]  PCP:  Nicoletta Dress, MD  Cardiologist:  Jenean Lindau, MD   Referring MD: Nicoletta Dress, MD    ASSESSMENT:    1. Paroxysmal atrial fibrillation (HCC)   2. Essential hypertension   3. Atherosclerotic vascular disease   4. Type 2 diabetes mellitus without complication, without long-term current use of insulin ([place])   5. Morbid obesity ([place])   6. Dyslipidemia    PLAN:    In order of problems listed above:  1. Paroxysmal atrial fibrillation: I discussed with the patient atrial fibrillation, disease process. Management and therapy including rate and rhythm control, anticoagulation benefits and potential risks were discussed extensively with the patient. Patient had multiple questions which were answered to patient's satisfaction.  In view of this the patient will have cardioversion next week.  In the interim she will have amiodarone 200 mg daily.  She will take her Xarelto uninterrupted and I discussed this with her at length.  She will have a Chem-7 and magnesium levels.  She will have a chest x-ray today.  She will be seen in follow-up appointment in a month or earlier if she has any concerns the procedure, benefits  explained to the patient and she vocalized understanding   Medication Adjustments/Labs and Tests Ordered: Current medicines are reviewed at length with the patient today.  Concerns regarding medicines are outlined above.  No orders of the defined types were placed in this encounter.  No orders of the defined types were placed in this encounter.    No chief complaint on file.    History of Present Illness:    Crystal Ramos is a 63 y.o. female.  Patient has past medical history approximately fibrillation, essential hypertension and morbid obesity.  She denies any problems at this time except that she has shortness of breath on exertion.  No chest pain  orthopnea or PND.  She is here for follow-up visit.  Past Medical History:  Diagnosis Date  . Acute diastolic heart failure ([place])   . Atrial fibrillation ([place])   . Chest pain   . Essential hypertension     Past Surgical History:  Procedure Laterality Date  . CHOLECYSTECTOMY      Current Medications: Current Meds  Medication Sig  . ALPRAZolam (XANAX) 0.25 MG tablet Take 1 tablet by mouth as needed.  Marland Kitchen amiodarone (PACERONE) 200 MG tablet Take 1 tablet (200 mg total) by mouth daily.  Marland Kitchen atorvastatin (LIPITOR) 40 MG tablet Take 40 mg by mouth daily.  . clindamycin (CLEOCIN) 300 MG capsule 300 mg 3 (three) times daily.  . empagliflozin (JARDIANCE) 25 MG TABS tablet Take 25 mg by mouth daily.  Marland Kitchen escitalopram (LEXAPRO) 10 MG tablet Take 1 tablet by mouth daily.  . furosemide (LASIX) 40 MG tablet Take 1 tablet by mouth daily.  Marland Kitchen glimepiride (AMARYL) 4 MG tablet Take 4 mg by mouth daily with breakfast.   . levothyroxine (SYNTHROID, LEVOTHROID) 50 MCG tablet Take 50 mcg by mouth daily.  Marland Kitchen linagliptin (TRADJENTA) 5 MG TABS tablet Take 5 mg by mouth daily.   . meclizine (ANTIVERT) 25 MG tablet Take 1 tablet by mouth daily.  . metFORMIN (GLUCOPHAGE) 850 MG tablet Take 850 mg by mouth 3 (three) times daily.  . metoprolol succinate (TOPROL-XL) 100 MG 24 hr tablet Take 100 mg by mouth daily. Take with or immediately following a meal.  .  PACERONE 100 MG tablet Take 1 tablet by mouth once daily  . rivaroxaban (XARELTO) 20 MG TABS tablet Take 20 mg by mouth daily with supper.     Allergies:   Patient has no known allergies.   Social History   Socioeconomic History  . Marital status: Married    Spouse name: Not on file  . Number of children: Not on file  . Years of education: Not on file  . Highest education level: Not on file  Occupational History  . Not on file  Social Needs  . Financial resource strain: Not on file  . Food insecurity    Worry: Not on file    Inability: Not on file   . Transportation needs    Medical: Not on file    Non-medical: Not on file  Tobacco Use  . Smoking status: Passive Smoke Exposure - Never Smoker  . Smokeless tobacco: Never Used  . Tobacco comment: "whole family" smoked in the house  Substance and Sexual Activity  . Alcohol use: No  . Drug use: No  . Sexual activity: Not on file  Lifestyle  . Physical activity    Days per week: Not on file    Minutes per session: Not on file  . Stress: Not on file  Relationships  . Social Herbalist on phone: Not on file    Gets together: Not on file    Attends religious service: Not on file    Active member of club or organization: Not on file    Attends meetings of clubs or organizations: Not on file    Relationship status: Not on file  Other Topics Concern  . Not on file  Social History Narrative  . Not on file     Family History: The patient's family history includes Breast cancer in her paternal aunt; Diabetes in her mother; Stroke in her father and paternal aunt.  ROS:   Please see the history of present illness.    All other systems reviewed and are negative.  EKGs/Labs/Other Studies Reviewed:    The following studies were reviewed today: EKG reveals atrial fibrillation with elevated ventricular rate   Recent Labs: 05/31/2018: TSH 0.181 01/05/2019: ALT 15; BUN 12; Creatinine, Ser 0.74; Potassium 3.6; Sodium 140  Recent Lipid Panel    Component Value Date/Time   CHOL 168 06/07/2017 0912   TRIG 107 06/07/2017 0912   HDL 60 06/07/2017 0912   CHOLHDL 2.8 06/07/2017 0912   LDLCALC 87 06/07/2017 0912    Physical Exam:    VS:  BP 122/80   Pulse (!) 116   Ht 5\' 2"  (1.575 m)   Wt 213 lb 6.4 oz (96.8 kg)   SpO2 98%   BMI 39.03 kg/m     Wt Readings from Last 3 Encounters:  02/24/19 213 lb 6.4 oz (96.8 kg)  01/05/19 219 lb (99.3 kg)  11/30/18 220 lb (99.8 kg)     GEN: Patient is in no acute distress HEENT: Normal NECK: No JVD; No carotid bruits  LYMPHATICS: No lymphadenopathy CARDIAC: Hear sounds regular, 2/6 systolic murmur at the apex. RESPIRATORY:  Clear to auscultation without rales, wheezing or rhonchi  ABDOMEN: Soft, non-tender, non-distended MUSCULOSKELETAL:  No edema; No deformity  SKIN: Warm and dry NEUROLOGIC:  Alert and oriented x 3 PSYCHIATRIC:  Normal affect   Signed, Jenean Lindau, MD  02/24/2019 2:03 PM    [place]

## 2019-02-24 NOTE — Addendum Note (Signed)
Addended by: Beckey Rutter on: 02/24/2019 02:27 PM   Modules accepted: Orders

## 2019-02-25 LAB — BASIC METABOLIC PANEL
BUN/Creatinine Ratio: 14 (ref 12–28)
BUN: 12 mg/dL (ref 8–27)
CO2: 24 mmol/L (ref 20–29)
Calcium: 8.9 mg/dL (ref 8.7–10.3)
Chloride: 101 mmol/L (ref 96–106)
Creatinine, Ser: 0.83 mg/dL (ref 0.57–1.00)
GFR calc Af Amer: 86 mL/min/{1.73_m2} (ref >59)
GFR calc non Af Amer: 74 mL/min/{1.73_m2} (ref >59)
Glucose: 158 mg/dL — ABNORMAL HIGH (ref 65–99)
Potassium: 3.5 mmol/L (ref 3.5–5.2)
Sodium: 139 mmol/L (ref 134–144)

## 2019-02-25 LAB — MAGNESIUM: Magnesium: 2 mg/dL (ref 1.6–2.3)

## 2019-02-27 ENCOUNTER — Telehealth: Payer: Self-pay

## 2019-02-27 NOTE — Telephone Encounter (Signed)
-----   Message from Jenean Lindau, MD sent at 02/27/2019 12:35 PM EDT ----- The results of the study is unremarkable. Please inform patient. I will discuss in detail at next appointment. Cc  primary care/referring physician Jenean Lindau, MD 02/27/2019 12:35 PM

## 2019-02-27 NOTE — Telephone Encounter (Signed)
Results relayed copy sent to Dr. Delena Bali per Dr.RRR.

## 2019-03-01 DIAGNOSIS — I4891 Unspecified atrial fibrillation: Secondary | ICD-10-CM | POA: Diagnosis not present

## 2019-03-01 DIAGNOSIS — E119 Type 2 diabetes mellitus without complications: Secondary | ICD-10-CM | POA: Diagnosis not present

## 2019-03-01 DIAGNOSIS — Z79899 Other long term (current) drug therapy: Secondary | ICD-10-CM | POA: Diagnosis not present

## 2019-03-01 DIAGNOSIS — Z7901 Long term (current) use of anticoagulants: Secondary | ICD-10-CM | POA: Diagnosis not present

## 2019-03-01 DIAGNOSIS — E785 Hyperlipidemia, unspecified: Secondary | ICD-10-CM | POA: Diagnosis not present

## 2019-03-01 DIAGNOSIS — I4819 Other persistent atrial fibrillation: Secondary | ICD-10-CM | POA: Diagnosis not present

## 2019-03-01 DIAGNOSIS — Z7984 Long term (current) use of oral hypoglycemic drugs: Secondary | ICD-10-CM | POA: Diagnosis not present

## 2019-03-01 DIAGNOSIS — I10 Essential (primary) hypertension: Secondary | ICD-10-CM | POA: Diagnosis not present

## 2019-03-07 ENCOUNTER — Encounter: Payer: Self-pay | Admitting: *Deleted

## 2019-03-07 DIAGNOSIS — I480 Paroxysmal atrial fibrillation: Secondary | ICD-10-CM | POA: Diagnosis not present

## 2019-03-08 ENCOUNTER — Other Ambulatory Visit: Payer: Self-pay

## 2019-03-08 ENCOUNTER — Encounter: Payer: Self-pay | Admitting: Cardiology

## 2019-03-08 ENCOUNTER — Ambulatory Visit (INDEPENDENT_AMBULATORY_CARE_PROVIDER_SITE_OTHER): Payer: PPO | Admitting: Cardiology

## 2019-03-08 VITALS — BP 120/70 | HR 53 | Ht 62.0 in | Wt 219.0 lb

## 2019-03-08 DIAGNOSIS — I10 Essential (primary) hypertension: Secondary | ICD-10-CM

## 2019-03-08 DIAGNOSIS — R0602 Shortness of breath: Secondary | ICD-10-CM

## 2019-03-08 DIAGNOSIS — I480 Paroxysmal atrial fibrillation: Secondary | ICD-10-CM | POA: Diagnosis not present

## 2019-03-08 DIAGNOSIS — I5032 Chronic diastolic (congestive) heart failure: Secondary | ICD-10-CM

## 2019-03-08 HISTORY — DX: Chronic diastolic (congestive) heart failure: I50.32

## 2019-03-08 LAB — BASIC METABOLIC PANEL
BUN/Creatinine Ratio: 12 (ref 12–28)
BUN: 10 mg/dL (ref 8–27)
CO2: 27 mmol/L (ref 20–29)
Calcium: 8.7 mg/dL (ref 8.7–10.3)
Chloride: 99 mmol/L (ref 96–106)
Creatinine, Ser: 0.85 mg/dL (ref 0.57–1.00)
GFR calc Af Amer: 83 mL/min/{1.73_m2} (ref >59)
GFR calc non Af Amer: 72 mL/min/{1.73_m2} (ref >59)
Glucose: 102 mg/dL — ABNORMAL HIGH (ref 65–99)
Potassium: 3.5 mmol/L (ref 3.5–5.2)
Sodium: 141 mmol/L (ref 134–144)

## 2019-03-08 LAB — MAGNESIUM: Magnesium: 2 mg/dL (ref 1.6–2.3)

## 2019-03-08 MED ORDER — FUROSEMIDE 40 MG PO TABS: 40.0000 mg | ORAL_TABLET | Freq: Two times a day (BID) | ORAL | 3 refills | Status: DC

## 2019-03-08 NOTE — Progress Notes (Addendum)
Cardiology Office Note:    Date:  03/08/2019   ID:  Crystal Ramos, DOB [birth year], MRN [id number]  PCP:  Nicoletta Dress, MD  Cardiologist:  No primary care provider on file.  Electrophysiologist:  None   Referring MD: Nicoletta Dress, MD   Chief Complaint  Patient presents with  . Follow-up    Shortness of breath    History of Present Illness:    Crystal Ramos is a 63 y.o. female with a hx of Essential hypertension, diastolic heart failure, EF 50 to 55% in October 2018, atrial fibrillation on amiodarone and Xarelto.the patient did see Dr. Geraldo Pitter 02/24/2019 at which time a cardioversion was planned given that she was symptomatic atrial fibrillation.  She had her cardioversion on 2/14 2020 and this was successful.  Today she comes for follow-up visit she tells me that she has been significantly short of breath on exertion.  She notes that unable to do her whole day chores without being short of breath.  Remarkably she tells me that she has been sleeping with more than 2 pillows at night.  She reports orthopnea, PND and leg swelling.  She denies any chest pain, lightheadedness and dizziness.  Past Medical History:  Diagnosis Date  . Acute diastolic heart failure ([place])   . Atrial fibrillation ([place])   . Chest pain   . Essential hypertension     Past Surgical History:  Procedure Laterality Date  . CHOLECYSTECTOMY    . MEDIAL PARTIAL KNEE REPLACEMENT      Current Medications: Current Meds  Medication Sig  . ALPRAZolam (XANAX) 0.25 MG tablet Take 1 tablet by mouth as needed.  Marland Kitchen amiodarone (PACERONE) 200 MG tablet Take 1 tablet (200 mg total) by mouth daily.  Marland Kitchen atorvastatin (LIPITOR) 40 MG tablet Take 40 mg by mouth daily.  . canagliflozin (INVOKANA) 300 MG TABS tablet Take 300 mg by mouth daily before breakfast.  . empagliflozin (JARDIANCE) 25 MG TABS tablet Take 25 mg by mouth daily.  Marland Kitchen escitalopram (LEXAPRO) 10 MG tablet Take 1 tablet by mouth daily.  . furosemide  (LASIX) 40 MG tablet Take 1 tablet (40 mg total) by mouth 2 (two) times daily.  Marland Kitchen glimepiride (AMARYL) 4 MG tablet Take 4 mg by mouth 2 (two) times daily.   Marland Kitchen levothyroxine (SYNTHROID, LEVOTHROID) 50 MCG tablet Take 75 mcg by mouth daily.   Marland Kitchen linagliptin (TRADJENTA) 5 MG TABS tablet Take 5 mg by mouth daily.   Marland Kitchen lisinopril (ZESTRIL) 40 MG tablet Take 40 mg by mouth daily.  . meclizine (ANTIVERT) 25 MG tablet Take 1 tablet by mouth daily.  . metFORMIN (GLUCOPHAGE) 850 MG tablet Take 850 mg by mouth 3 (three) times daily.  . metoprolol succinate (TOPROL-XL) 100 MG 24 hr tablet Take 50 mg by mouth daily. Take with or immediately following a meal.   . nitroGLYCERIN (NITROSTAT) 0.4 MG SL tablet Place 1 tablet (0.4 mg total) under the tongue every 5 (five) minutes as needed.  . potassium chloride SA (KLOR-CON) 20 MEQ tablet Take 20 mEq by mouth daily.  . rivaroxaban (XARELTO) 20 MG TABS tablet Take 20 mg by mouth daily with supper.  . sitaGLIPtin (JANUVIA) 100 MG tablet Take 100 mg by mouth daily.  . [DISCONTINUED] furosemide (LASIX) 40 MG tablet Take 1 tablet by mouth daily.     Allergies:   Patient has no known allergies.   Social History   Socioeconomic History  . Marital status: Married    Spouse  name: Not on file  . Number of children: Not on file  . Years of education: Not on file  . Highest education level: Not on file  Occupational History  . Not on file  Social Needs  . Financial resource strain: Not on file  . Food insecurity    Worry: Not on file    Inability: Not on file  . Transportation needs    Medical: Not on file    Non-medical: Not on file  Tobacco Use  . Smoking status: Passive Smoke Exposure - Never Smoker  . Smokeless tobacco: Never Used  . Tobacco comment: "whole family" smoked in the house  Substance and Sexual Activity  . Alcohol use: No  . Drug use: No  . Sexual activity: Not on file  Lifestyle  . Physical activity    Days per week: Not on file     Minutes per session: Not on file  . Stress: Not on file  Relationships  . Social Herbalist on phone: Not on file    Gets together: Not on file    Attends religious service: Not on file    Active member of club or organization: Not on file    Attends meetings of clubs or organizations: Not on file    Relationship status: Not on file  Other Topics Concern  . Not on file  Social History Narrative  . Not on file     Family History: The patient's family history includes Breast cancer in her paternal aunt; Diabetes in her mother; Stroke in her father and paternal aunt.  ROS:   Review of Systems  Constitution: Negative for decreased appetite, fever and weight gain.  HENT: Negative for congestion, ear discharge, hoarse voice and sore throat.   Eyes: Negative for discharge, redness, vision loss in right eye and visual halos.  Cardiovascular: Dyspnea on exertion.  Negative for chest pain, leg swelling, orthopnea and palpitations.  Respiratory: Negative for cough, hemoptysis, shortness of breath and snoring.   Endocrine: Negative for heat intolerance and polyphagia.  Hematologic/Lymphatic: Negative for bleeding problem. Does not bruise/bleed easily.  Skin: Negative for flushing, nail changes, rash and suspicious lesions.  Musculoskeletal: Negative for arthritis, joint pain, muscle cramps, myalgias, neck pain and stiffness.  Gastrointestinal: Negative for abdominal pain, bowel incontinence, diarrhea and excessive appetite.  Genitourinary: Negative for decreased libido, genital sores and incomplete emptying.  Neurological: Negative for brief paralysis, focal weakness, headaches and loss of balance.  Psychiatric/Behavioral: Negative for altered mental status, depression and suicidal ideas.  Allergic/Immunologic: Negative for HIV exposure and persistent infections.    EKGs/Labs/Other Studies Reviewed:    The following studies were reviewed today:  EKG:  The ekg ordered today  demonstrates sinus bradycardia, heart rate 53 bpm with first-degree AV block compared to EKG performed on 03/01/2019 sinus bradycardia, first-degree AV block with premature atrial complexes.  Transthoracic echocardiogram September 2019 reports left ventricle systolic function low normal 50 to 55%.  There is mild aortic valve sclerosis.  There is a trace mild mitral vegetation.  Tricuspid regurgitation.   Pharmacologic stress test performed on December 07, 2016 reports a 6 perfusion defect which was reported as likely breast attenuation.  LVEF of 67%.  With normal wall motion.  Recent Labs: 05/31/2018: TSH 0.181 01/05/2019: ALT 15 02/24/2019: BUN 12; Creatinine, Ser 0.83; Magnesium 2.0; Potassium 3.5; Sodium 139  Recent Lipid Panel    Component Value Date/Time   CHOL 168 06/07/2017 0912   TRIG 107 06/07/2017  0912   HDL 60 06/07/2017 0912   CHOLHDL 2.8 06/07/2017 0912   LDLCALC 87 06/07/2017 0912    Physical Exam:    VS:  BP 120/70 (BP Location: Left Arm, Patient Position: Sitting, Cuff Size: Large)   Pulse (!) 53   Ht 5\' 2"  (1.575 m)   Wt 219 lb (99.3 kg)   SpO2 98%   BMI 40.06 kg/m     Wt Readings from Last 3 Encounters:  03/08/19 219 lb (99.3 kg)  02/24/19 213 lb 6.4 oz (96.8 kg)  01/05/19 219 lb (99.3 kg)     GEN: Well nourished, well developed in no acute distress HEENT: Normal NECK: No JVD; No carotid bruits LYMPHATICS: No lymphadenopathy CARDIAC: S1S2 noted,RRR, no murmurs, rubs, gallops RESPIRATORY:  Clear to auscultation without rales, wheezing or rhonchi  ABDOMEN: Soft, non-tender, non-distended, +bowel sounds, no guarding. EXTREMITIES: No edema, No cyanosis, no clubbing MUSCULOSKELETAL:  No edema; No deformity  SKIN: Warm and dry NEUROLOGIC:  Alert and oriented x 3, non-focal PSYCHIATRIC:  Normal affect, good insight  ASSESSMENT:    1. Shortness of breath   2. Paroxysmal atrial fibrillation ([facility])   3. Chronic diastolic heart failure ([place])   4. Essential  hypertension    PLAN:     1.  The patient is in sinus rhythm today.  She will continue on amiodarone 200 mg daily as well as her Xarelto.  2.  In terms of her shortness of breath does have bilateral leg swelling with pitting edema and her clinical story is consistent with worsening heart failure as well as I do suspect diastolic dysfunction explainable.  At this time I am going to increase her Lasix from 40 mg daily to 40 mg twice daily.  In addition blood work will be done today to assess her kidney function.  A transthoracic echo will be formed to assess any worsening LV function as well as any valvular abnormalities.  3.  Meanwhile if shortness of breath does not improve with the above increasing diuretics.  It will be beneficial for the patient to wear a monitor to better understand if she is getting any breakthrough atrial fibrillation on her amiodarone.  4. Blood work will be performed today.  Includes BMP, mag.   The patient is in agreement with the above plan. The patient left the office in stable condition.  The patient will follow up in 2 weeks.   Medication Adjustments/Labs and Tests Ordered: Current medicines are reviewed at length with the patient today.  Concerns regarding medicines are outlined above.  Orders Placed This Encounter  Procedures  . Basic Metabolic Panel (BMET)  . Magnesium  . Basic Metabolic Panel (BMET)  . Magnesium  . EKG 12-Lead  . ECHOCARDIOGRAM COMPLETE   Meds ordered this encounter  Medications  . furosemide (LASIX) 40 MG tablet    Sig: Take 1 tablet (40 mg total) by mouth 2 (two) times daily.    Dispense:  60 tablet    Refill:  3    Patient Instructions  Medication Instructions:  Your physician has recommended you make the following change in your medication:   INCREASE: Lasix(furosemide) to 40 mg Take 1 tab twice daily  *If you need a refill on your cardiac medications before your next appointment, please call your pharmacy*  Lab Work:  Your physician recommends that you return for lab work in: TODAY BMP,Magnesium  Prior to next appointment: BMP, Magnesium  If you have labs (blood work) drawn today and your  tests are completely normal, you will receive your results only by: Marland Kitchen MyChart Message (if you have MyChart) OR . A paper copy in the mail If you have any lab test that is abnormal or we need to change your treatment, we will call you to review the results.  Testing/Procedures: Your physician has requested that you have an echocardiogram. Echocardiography is a painless test that uses sound waves to create images of your heart. It provides your doctor with information about the size and shape of your heart and how well your heart's chambers and valves are working. This procedure takes approximately one hour. There are no restrictions for this procedure.    Follow-Up: At [facility], you and your health needs are our priority.  As part of our continuing mission to provide you with exceptional heart care, we have created designated Provider Care Teams.  These Care Teams include your primary Cardiologist (physician) and Advanced Practice Providers (APPs -  Physician Assistants and Nurse Practitioners) who all work together to provide you with the care you need, when you need it.  Your next appointment:   2 weeks  The format for your next appointment:   In Person  Provider:   Jyl Heinz, MD or Dr Harriet Masson  Other Instructions   Echocardiogram An echocardiogram is a procedure that uses painless sound waves (ultrasound) to produce an image of the heart. Images from an echocardiogram can provide important information about:  Signs of coronary artery disease (CAD).  Aneurysm detection. An aneurysm is a weak or damaged part of an artery wall that bulges out from the normal force of blood pumping through the body.  Heart size and shape. Changes in the size or shape of the heart can be associated with certain  conditions, including heart failure, aneurysm, and CAD.  Heart muscle function.  Heart valve function.  Signs of a past heart attack.  Fluid buildup around the heart.  Thickening of the heart muscle.  A tumor or infectious growth around the heart valves. Tell a health care provider about:  Any allergies you have.  All medicines you are taking, including vitamins, herbs, eye drops, creams, and over-the-counter medicines.  Any blood disorders you have.  Any surgeries you have had.  Any medical conditions you have.  Whether you are pregnant or may be pregnant. What are the risks? Generally, this is a safe procedure. However, problems may occur, including:  Allergic reaction to dye (contrast) that may be used during the procedure. What happens before the procedure? No specific preparation is needed. You may eat and drink normally. What happens during the procedure?   An IV tube may be inserted into one of your veins.  You may receive contrast through this tube. A contrast is an injection that improves the quality of the pictures from your heart.  A gel will be applied to your chest.  A wand-like tool (transducer) will be moved over your chest. The gel will help to transmit the sound waves from the transducer.  The sound waves will harmlessly bounce off of your heart to allow the heart images to be captured in real-time motion. The images will be recorded on a computer. The procedure may vary among health care providers and hospitals. What happens after the procedure?  You may return to your normal, everyday life, including diet, activities, and medicines, unless your health care provider tells you not to do that. Summary  An echocardiogram is a procedure that uses painless sound waves (ultrasound)  to produce an image of the heart.  Images from an echocardiogram can provide important information about the size and shape of your heart, heart muscle function, heart valve  function, and fluid buildup around your heart.  You do not need to do anything to prepare before this procedure. You may eat and drink normally.  After the echocardiogram is completed, you may return to your normal, everyday life, unless your health care provider tells you not to do that. This information is not intended to replace advice given to you by your health care provider. Make sure you discuss any questions you have with your health care provider. Document Released: 05/01/2000 Document Revised: 08/25/2018 Document Reviewed: 06/06/2016 Elsevier Patient Education  2020 [employer].      Adopting a Healthy Lifestyle.  Know what a healthy weight is for you (roughly BMI <25) and aim to maintain this   Aim for 7+ servings of fruits and vegetables daily   65-80+ fluid ounces of water or unsweet tea for healthy kidneys   Limit to max 1 drink of alcohol per day; avoid smoking/tobacco   Limit animal fats in diet for cholesterol and heart health - choose grass fed whenever available   Avoid highly processed foods, and foods high in saturated/trans fats   Aim for low stress - take time to unwind and care for your mental health   Aim for 150 min of moderate intensity exercise weekly for heart health, and weights twice weekly for bone health   Aim for 7-9 hours of sleep daily   When it comes to diets, agreement about the perfect plan isnt easy to find, even among the experts. Experts at the [employer] developed an idea known as the Healthy Eating Plate. Just imagine a plate divided into logical, healthy portions.   The emphasis is on diet quality:   Load up on vegetables and fruits - one-half of your plate: Aim for color and variety, and remember that potatoes dont count.   Go for whole grains - one-quarter of your plate: Whole wheat, barley, wheat berries, quinoa, oats, brown rice, and foods made with them. If you want pasta, go with whole wheat pasta.    Protein power - one-quarter of your plate: Fish, chicken, beans, and nuts are all healthy, versatile protein sources. Limit red meat.   The diet, however, does go beyond the plate, offering a few other suggestions.   Use healthy plant oils, such as olive, canola, soy, corn, sunflower and peanut. Check the labels, and avoid partially hydrogenated oil, which have unhealthy trans fats.   If youre thirsty, drink water. Coffee and tea are good in moderation, but skip sugary drinks and limit milk and dairy products to one or two daily servings.   The type of carbohydrate in the diet is more important than the amount. Some sources of carbohydrates, such as vegetables, fruits, whole grains, and beans-are healthier than others.   Finally, stay active  Signed, Berniece Salines, DO  03/08/2019 9:01 AM    [place]

## 2019-03-08 NOTE — Patient Instructions (Signed)
Medication Instructions:  Your physician has recommended you make the following change in your medication:   INCREASE: Lasix(furosemide) to 40 mg Take 1 tab twice daily  *If you need a refill on your cardiac medications before your next appointment, please call your pharmacy*  Lab Work: Your physician recommends that you return for lab work in: TODAY BMP,Magnesium  Prior to next appointment: BMP, Magnesium  If you have labs (blood work) drawn today and your tests are completely normal, you will receive your results only by: Marland Kitchen MyChart Message (if you have MyChart) OR . A paper copy in the mail If you have any lab test that is abnormal or we need to change your treatment, we will call you to review the results.  Testing/Procedures: Your physician has requested that you have an echocardiogram. Echocardiography is a painless test that uses sound waves to create images of your heart. It provides your doctor with information about the size and shape of your heart and how well your heart's chambers and valves are working. This procedure takes approximately one hour. There are no restrictions for this procedure.    Follow-Up: At [facility], you and your health needs are our priority.  As part of our continuing mission to provide you with exceptional heart care, we have created designated Provider Care Teams.  These Care Teams include your primary Cardiologist (physician) and Advanced Practice Providers (APPs -  Physician Assistants and Nurse Practitioners) who all work together to provide you with the care you need, when you need it.  Your next appointment:   2 weeks  The format for your next appointment:   In Person  Provider:   Jyl Heinz, MD or Dr Harriet Masson  Other Instructions   Echocardiogram An echocardiogram is a procedure that uses painless sound waves (ultrasound) to produce an image of the heart. Images from an echocardiogram can provide important information about:   Signs of coronary artery disease (CAD).  Aneurysm detection. An aneurysm is a weak or damaged part of an artery wall that bulges out from the normal force of blood pumping through the body.  Heart size and shape. Changes in the size or shape of the heart can be associated with certain conditions, including heart failure, aneurysm, and CAD.  Heart muscle function.  Heart valve function.  Signs of a past heart attack.  Fluid buildup around the heart.  Thickening of the heart muscle.  A tumor or infectious growth around the heart valves. Tell a health care provider about:  Any allergies you have.  All medicines you are taking, including vitamins, herbs, eye drops, creams, and over-the-counter medicines.  Any blood disorders you have.  Any surgeries you have had.  Any medical conditions you have.  Whether you are pregnant or may be pregnant. What are the risks? Generally, this is a safe procedure. However, problems may occur, including:  Allergic reaction to dye (contrast) that may be used during the procedure. What happens before the procedure? No specific preparation is needed. You may eat and drink normally. What happens during the procedure?   An IV tube may be inserted into one of your veins.  You may receive contrast through this tube. A contrast is an injection that improves the quality of the pictures from your heart.  A gel will be applied to your chest.  A wand-like tool (transducer) will be moved over your chest. The gel will help to transmit the sound waves from the transducer.  The sound waves will  harmlessly bounce off of your heart to allow the heart images to be captured in real-time motion. The images will be recorded on a computer. The procedure may vary among health care providers and hospitals. What happens after the procedure?  You may return to your normal, everyday life, including diet, activities, and medicines, unless your health care provider  tells you not to do that. Summary  An echocardiogram is a procedure that uses painless sound waves (ultrasound) to produce an image of the heart.  Images from an echocardiogram can provide important information about the size and shape of your heart, heart muscle function, heart valve function, and fluid buildup around your heart.  You do not need to do anything to prepare before this procedure. You may eat and drink normally.  After the echocardiogram is completed, you may return to your normal, everyday life, unless your health care provider tells you not to do that. This information is not intended to replace advice given to you by your health care provider. Make sure you discuss any questions you have with your health care provider. Document Released: 05/01/2000 Document Revised: 08/25/2018 Document Reviewed: 06/06/2016 Elsevier Patient Education  2020 [employer].

## 2019-03-08 NOTE — Progress Notes (Signed)
219lb

## 2019-03-21 DIAGNOSIS — R7989 Other specified abnormal findings of blood chemistry: Secondary | ICD-10-CM | POA: Diagnosis not present

## 2019-03-21 DIAGNOSIS — E039 Hypothyroidism, unspecified: Secondary | ICD-10-CM | POA: Diagnosis not present

## 2019-03-24 ENCOUNTER — Other Ambulatory Visit: Payer: Self-pay

## 2019-03-24 ENCOUNTER — Ambulatory Visit (INDEPENDENT_AMBULATORY_CARE_PROVIDER_SITE_OTHER): Payer: PPO

## 2019-03-24 DIAGNOSIS — R0602 Shortness of breath: Secondary | ICD-10-CM | POA: Diagnosis not present

## 2019-03-24 NOTE — Progress Notes (Signed)
Complete echocardiogram has been performed.  Jimmy Retta Pitcher RDCS, RVT 

## 2019-03-27 ENCOUNTER — Ambulatory Visit (INDEPENDENT_AMBULATORY_CARE_PROVIDER_SITE_OTHER): Payer: PPO | Admitting: Cardiology

## 2019-03-27 ENCOUNTER — Other Ambulatory Visit: Payer: Self-pay

## 2019-03-27 ENCOUNTER — Encounter: Payer: Self-pay | Admitting: Cardiology

## 2019-03-27 VITALS — BP 122/78 | HR 61 | Ht 62.0 in | Wt 212.4 lb

## 2019-03-27 DIAGNOSIS — E088 Diabetes mellitus due to underlying condition with unspecified complications: Secondary | ICD-10-CM

## 2019-03-27 DIAGNOSIS — I480 Paroxysmal atrial fibrillation: Secondary | ICD-10-CM

## 2019-03-27 DIAGNOSIS — I10 Essential (primary) hypertension: Secondary | ICD-10-CM | POA: Diagnosis not present

## 2019-03-27 DIAGNOSIS — I209 Angina pectoris, unspecified: Secondary | ICD-10-CM | POA: Insufficient documentation

## 2019-03-27 DIAGNOSIS — I7090 Unspecified atherosclerosis: Secondary | ICD-10-CM | POA: Diagnosis not present

## 2019-03-27 HISTORY — DX: Diabetes mellitus due to underlying condition with unspecified complications: E08.8

## 2019-03-27 HISTORY — DX: Angina pectoris, unspecified: I20.9

## 2019-03-27 NOTE — Progress Notes (Signed)
Cardiology Office Note:    Date:  03/27/2019   ID:  LACANDICE GOGA, DOB 1956/02/15, MRN [id number]  PCP:  Nicoletta Dress, MD  Cardiologist:  Jenean Lindau, MD   Referring MD: Nicoletta Dress, MD    ASSESSMENT:    1. Angina pectoris ([place])   2. Atherosclerotic vascular disease   3. Essential hypertension   4. Paroxysmal atrial fibrillation ([facility])   5. Diabetes mellitus due to underlying condition with unspecified complications ([place])    PLAN:    In order of problems listed above:  1. Angina pectoris: Dyspnea on exertion: The patient's symptoms are very concerning and suggestive of anginal equivalent.  She has multiple risk factors for coronary artery disease.  On a CT of the chest earlier she has had intense calcifications.  In view of this I discussed with her coronary CT angiography and conventional angiography and she prefers the latter.I discussed coronary angiography and left heart catheterization with the patient at extensive length. Procedure, benefits and potential risks were explained. Patient had multiple questions which were answered to the patient's satisfaction. Patient agreed and consented for the procedure. Further recommendations will be made based on the findings of the coronary angiography. In the interim. The patient has any significant symptoms he knows to go to the nearest emergency room.  In view of forthcoming coronary angiography I asked her to cut down her diuretic dose to 40 mg of furosemide daily.  She will have blood work today. 2. Essential hypertension: Blood pressure stable 3. Mixed dyslipidemia and diabetes mellitus: Lipids followed by primary care physician.  Diet was emphasized.   Medication Adjustments/Labs and Tests Ordered: Current medicines are reviewed at length with the patient today.  Concerns regarding medicines are outlined above.  No orders of the defined types were placed in this encounter.  No orders of the defined types were placed  in this encounter.    No chief complaint on file.    History of Present Illness:    Crystal Ramos is a 63 y.o. female.  Patient has past medical history of congestive heart failure, essential hypertension, diabetes mellitus, mixed dyslipidemia and obesity.  She has paroxysmal atrial fibrillation and has undergone cardioversion.  She complains of chest tightness on exertion and also shortness of breath on exertion.  She is converted into sinus rhythm but the symptoms have not gotten better.  At the time of my evaluation, the patient is alert awake oriented and in no distress.  Her diuretic dose was doubled by my partner when she saw her a few days ago and this has not helped the patient much with the above symptoms.  At the time of my evaluation, the patient is alert awake oriented and in no distress.  Past Medical History:  Diagnosis Date  . Acute diastolic heart failure ([place])   . Atrial fibrillation ([place])   . Chest pain   . Essential hypertension     Past Surgical History:  Procedure Laterality Date  . CHOLECYSTECTOMY    . MEDIAL PARTIAL KNEE REPLACEMENT      Current Medications: Current Meds  Medication Sig  . ALPRAZolam (XANAX) 0.25 MG tablet Take 1 tablet by mouth as needed.  Marland Kitchen amiodarone (PACERONE) 200 MG tablet Take 1 tablet (200 mg total) by mouth daily.  Marland Kitchen atorvastatin (LIPITOR) 40 MG tablet Take 40 mg by mouth daily.  . canagliflozin (INVOKANA) 300 MG TABS tablet Take 300 mg by mouth daily before breakfast.  . empagliflozin (JARDIANCE)  25 MG TABS tablet Take 25 mg by mouth daily.  Marland Kitchen escitalopram (LEXAPRO) 10 MG tablet Take 1 tablet by mouth daily.  . furosemide (LASIX) 40 MG tablet Take 1 tablet (40 mg total) by mouth 2 (two) times daily.  Marland Kitchen glimepiride (AMARYL) 4 MG tablet Take 4 mg by mouth 2 (two) times daily.   Marland Kitchen levothyroxine (SYNTHROID, LEVOTHROID) 50 MCG tablet Take 75 mcg by mouth daily.   Marland Kitchen linagliptin (TRADJENTA) 5 MG TABS tablet Take 5 mg by mouth daily.   Marland Kitchen  lisinopril (ZESTRIL) 40 MG tablet Take 40 mg by mouth daily.  . meclizine (ANTIVERT) 25 MG tablet Take 1 tablet by mouth daily.  . metFORMIN (GLUCOPHAGE) 850 MG tablet Take 850 mg by mouth 3 (three) times daily.  . metoprolol succinate (TOPROL-XL) 100 MG 24 hr tablet Take 50 mg by mouth daily. Take with or immediately following a meal.   . potassium chloride SA (KLOR-CON) 20 MEQ tablet Take 20 mEq by mouth daily.  . rivaroxaban (XARELTO) 20 MG TABS tablet Take 20 mg by mouth daily with supper.  . sitaGLIPtin (JANUVIA) 100 MG tablet Take 100 mg by mouth daily.     Allergies:   Patient has no known allergies.   Social History   Socioeconomic History  . Marital status: Married    Spouse name: Not on file  . Number of children: Not on file  . Years of education: Not on file  . Highest education level: Not on file  Occupational History  . Not on file  Social Needs  . Financial resource strain: Not on file  . Food insecurity    Worry: Not on file    Inability: Not on file  . Transportation needs    Medical: Not on file    Non-medical: Not on file  Tobacco Use  . Smoking status: Passive Smoke Exposure - Never Smoker  . Smokeless tobacco: Never Used  . Tobacco comment: "whole family" smoked in the house  Substance and Sexual Activity  . Alcohol use: No  . Drug use: No  . Sexual activity: Not on file  Lifestyle  . Physical activity    Days per week: Not on file    Minutes per session: Not on file  . Stress: Not on file  Relationships  . Social Herbalist on phone: Not on file    Gets together: Not on file    Attends religious service: Not on file    Active member of club or organization: Not on file    Attends meetings of clubs or organizations: Not on file    Relationship status: Not on file  Other Topics Concern  . Not on file  Social History Narrative  . Not on file     Family History: The patient's family history includes Breast cancer in her paternal  aunt; Diabetes in her mother; Stroke in her father and paternal aunt.  ROS:   Please see the history of present illness.    All other systems reviewed and are negative.  EKGs/Labs/Other Studies Reviewed:    The following studies were reviewed today: I discussed my findings with the patient at extensive length today.   Recent Labs: 05/31/2018: TSH 0.181 01/05/2019: ALT 15 03/08/2019: BUN 10; Creatinine, Ser 0.85; Magnesium 2.0; Potassium 3.5; Sodium 141  Recent Lipid Panel    Component Value Date/Time   CHOL 168 06/07/2017 0912   TRIG 107 06/07/2017 0912   HDL 60 06/07/2017 0912  CHOLHDL 2.8 06/07/2017 0912   LDLCALC 87 06/07/2017 0912    Physical Exam:    VS:  BP 122/78 (BP Location: Left Arm, Patient Position: Sitting, Cuff Size: Normal)   Pulse 61   Ht 5\' 2"  (1.575 m)   Wt 212 lb 6.4 oz (96.3 kg)   SpO2 96%   BMI 38.85 kg/m     Wt Readings from Last 3 Encounters:  03/27/19 212 lb 6.4 oz (96.3 kg)  03/08/19 219 lb (99.3 kg)  02/24/19 213 lb 6.4 oz (96.8 kg)     GEN: Patient is in no acute distress HEENT: Normal NECK: No JVD; No carotid bruits LYMPHATICS: No lymphadenopathy CARDIAC: Hear sounds regular, 2/6 systolic murmur at the apex. RESPIRATORY:  Clear to auscultation without rales, wheezing or rhonchi  ABDOMEN: Soft, non-tender, non-distended MUSCULOSKELETAL:  No edema; No deformity  SKIN: Warm and dry NEUROLOGIC:  Alert and oriented x 3 PSYCHIATRIC:  Normal affect   Signed, Jenean Lindau, MD  03/27/2019 12:00 PM    [place]

## 2019-03-27 NOTE — Patient Instructions (Signed)
Medication Instructions:  Your physician has recommended you make the following change in your medication: DECREASE lasix to 40 mg (1 tablet) once daily  *If you need a refill on your cardiac medications before your next appointment, please call your pharmacy*  Lab Work: Your physician recommends that you have a CBC and BMP today If you have labs (blood work) drawn today and your tests are completely normal, you will receive your results only by: Marland Kitchen MyChart Message (if you have MyChart) OR . A paper copy in the mail If you have any lab test that is abnormal or we need to change your treatment, we will call you to review the results.  Testing/Procedures: A chest x-ray takes a picture of the organs and structures inside the chest, including the heart, lungs, and blood vessels. This test can show several things, including, whether the heart is enlarges; whether fluid is building up in the lungs; and whether pacemaker / defibrillator leads are still in place.  You had an EKG performed today  Your physician has requested that you have a cardiac catheterization. Cardiac catheterization is used to diagnose and/or treat various heart conditions. Doctors may recommend this procedure for a number of different reasons. The most common reason is to evaluate chest pain. Chest pain can be a symptom of coronary artery disease (CAD), and cardiac catheterization can show whether plaque is narrowing or blocking your heart's arteries. This procedure is also used to evaluate the valves, as well as measure the blood flow and oxygen levels in different parts of your heart. For further information please visit [email or web address]. Please follow instruction sheet, as given.  YOU ARE SCHEDULED FOR [email or web address] screen on 03/30/19 at 2:40 at [place], [place]. You need to get in the pre screening line      [place] [id number] Dept: [phone] Loc: [place]  03/27/2019  You are scheduled for a Cardiac Catheterization on Monday, November 16 with Dr. Lauree Chandler.  1. Please arrive at the [facility] (Main Entrance A) at [facility]: [address] [place], [place] [address] at 5:30 AM (This time is two hours before your procedure to ensure your preparation). Free valet parking service is available.   Special note: Every effort is made to have your procedure done on time. Please understand that emergencies sometimes delay scheduled procedures.  2. Diet: Do not eat solid foods after midnight.  The patient may have clear liquids until 5am upon the day of the procedure.  3. Labs: DONE  4. Medication instructions in preparation for your procedure:   Contrast Allergy: No   Stop taking Xarelto (Rivaroxaban) on Friday, November 14.  Stop taking, Lasix (Furosemide)  Sunday, November 15,    Do not take Diabetes Med Glucophage (Metformin) on the day of the procedure and HOLD 48 HOURS AFTER THE PROCEDURE.  On the morning of your procedure, take your Aspirin and any morning medicines NOT listed above.  You may use sips of water.  5. Plan for one night stay--bring personal belongings. 6. Bring a current list of your medications and current insurance cards. 7. You MUST have a responsible person to drive you home. 8. Someone MUST be with you the first 24 hours after you arrive home or your discharge will be delayed. 9. Please wear clothes that are easy to get on and off and wear slip-on shoes.  Thank you for allowing [place] to care for you!   -- [facility]  Follow-Up: At [facility], you and your health needs are our priority.  As part of our continuing mission to provide you with exceptional heart care, we have created designated Provider Care Teams.  These Care Teams include your primary Cardiologist (physician) and  Advanced Practice Providers (APPs -  Physician Assistants and Nurse Practitioners) who all work together to provide you with the care you need, when you need it.  Your next appointment:   1 month f/u  The format for your next appointment:   In Person  Provider:   Jyl Heinz, MD  Other Instructions  Coronary Angiogram A coronary angiogram is an X-ray procedure that is used to examine the arteries in the heart. In this procedure, a dye (contrast dye) is injected through a long, thin tube (catheter). The catheter is inserted through the groin, wrist, or arm. The dye is injected into each artery, then X-rays are taken to show if there is a blockage in the arteries of the heart. This procedure can also show if you have valve disease or a disease of the aorta, and it can be used to check the overall function of your heart muscle. You may have a coronary angiogram if:  You are having chest pain, or other symptoms of angina, and you are at risk for heart disease.  You have an abnormal electrocardiogram (ECG) or stress test.  You have chest pain and heart failure.  You are having irregular heart rhythms.  You and your health care provider determine that the benefits of the test information outweigh the risks of the procedure. Let your health care provider know about:  Any allergies you have, including allergies to contrast dye.  All medicines you are taking, including vitamins, herbs, eye drops, creams, and over-the-counter medicines.  Any problems you or family members have had with anesthetic medicines.  Any blood disorders you have.  Any surgeries you have had.  History of kidney problems or kidney failure.  Any medical conditions you have.  Whether you are pregnant or may be pregnant. What are the risks? Generally, this is a safe procedure. However, problems may occur, including:  Infection.  Allergic reaction to medicines or dyes that are used.  Bleeding from the  access site or other locations.  Kidney injury, especially in people with impaired kidney function.  Stroke (rare).  Heart attack (rare).  Damage to other structures or organs. What happens before the procedure? Staying hydrated Follow instructions from your health care provider about hydration, which may include:  Up to 2 hours before the procedure - you may continue to drink clear liquids, such as water, clear fruit juice, black coffee, and plain tea. Eating and drinking restrictions Follow instructions from your health care provider about eating and drinking, which may include:  8 hours before the procedure - stop eating heavy meals or foods such as meat, fried foods, or fatty foods.  6 hours before the procedure - stop eating light meals or foods, such as toast or cereal.  2 hours before the procedure - stop drinking clear liquids. General instructions  Ask your health care provider about: ? Changing or stopping your regular medicines. This is especially important if you are taking diabetes medicines or blood thinners. ? Taking medicines such as ibuprofen. These medicines can thin your blood. Do not take these medicines before your procedure if your health care provider instructs you  not to, though aspirin may be recommended prior to coronary angiograms.  Plan to have someone take you home from the hospital or clinic.  You may need to have blood tests or X-rays done. What happens during the procedure?  An IV tube will be inserted into one of your veins.  You will be given one or more of the following: ? A medicine to help you relax (sedative). ? A medicine to numb the area where the catheter will be inserted into an artery (local anesthetic).  To reduce your risk of infection: ? Your health care team will wash or sanitize their hands. ? Your skin will be washed with soap. ? Hair may be removed from the area where the catheter will be inserted.  You will be connected to  a continuous ECG monitor.  The catheter will be inserted into an artery. The location may be in your groin, in your wrist, or in the fold of your arm (near your elbow).  A type of X-ray (fluoroscopy) will be used to help guide the catheter to the opening of the blood vessel that is being examined.  A dye will be injected into the catheter, and X-rays will be taken. The dye will help to show where any narrowing or blockages are located in the heart arteries.  Tell your health care provider if you have any chest pain or trouble breathing during the procedure.  If blockages are found, your health care provider may perform another procedure, such as inserting a coronary stent. The procedure may vary among health care providers and hospitals. What happens after the procedure?  After the procedure, you will need to keep the area still for a few hours, or for as long as told by your health care provider. If the procedure is done through the groin, you will be instructed to not bend and not cross your legs.  The insertion site will be checked frequently.  The pulse in your foot or wrist will be checked frequently.  You may have additional blood tests, X-rays, and a test that records the electrical activity of your heart (ECG).  Do not drive for 24 hours if you were given a sedative. Summary  A coronary angiogram is an X-ray procedure that is used to look into the arteries in the heart.  During the procedure, a dye (contrast dye) is injected through a long, thin tube (catheter). The catheter is inserted through the groin, wrist, or arm.  Tell your health care provider about any allergies you have, including allergies to contrast dye.  After the procedure, you will need to keep the area still for a few hours, or for as long as told by your health care provider. This information is not intended to replace advice given to you by your health care provider. Make sure you discuss any questions you  have with your health care provider. Document Released: 11/08/2002 Document Revised: 04/16/2017 Document Reviewed: 02/14/2016 Elsevier Patient Education  2020 [employer].

## 2019-03-27 NOTE — Addendum Note (Signed)
Addended by: Beckey Rutter on: 03/27/2019 12:36 PM   Modules accepted: Orders

## 2019-03-27 NOTE — H&P (View-Only) (Signed)
Cardiology Office Note:    Date:  03/27/2019   ID:  Crystal Ramos, DOB 01-Jan-1956, MRN [id number]  PCP:  Nicoletta Dress, MD  Cardiologist:  Jenean Lindau, MD   Referring MD: Nicoletta Dress, MD    ASSESSMENT:    1. Angina pectoris ([place])   2. Atherosclerotic vascular disease   3. Essential hypertension   4. Paroxysmal atrial fibrillation ([facility])   5. Diabetes mellitus due to underlying condition with unspecified complications ([place])    PLAN:    In order of problems listed above:  1. Angina pectoris: Dyspnea on exertion: The patient's symptoms are very concerning and suggestive of anginal equivalent.  She has multiple risk factors for coronary artery disease.  On a CT of the chest earlier she has had intense calcifications.  In view of this I discussed with her coronary CT angiography and conventional angiography and she prefers the latter.I discussed coronary angiography and left heart catheterization with the patient at extensive length. Procedure, benefits and potential risks were explained. Patient had multiple questions which were answered to the patient's satisfaction. Patient agreed and consented for the procedure. Further recommendations will be made based on the findings of the coronary angiography. In the interim. The patient has any significant symptoms he knows to go to the nearest emergency room.  In view of forthcoming coronary angiography I asked her to cut down her diuretic dose to 40 mg of furosemide daily.  She will have blood work today. 2. Essential hypertension: Blood pressure stable 3. Mixed dyslipidemia and diabetes mellitus: Lipids followed by primary care physician.  Diet was emphasized.   Medication Adjustments/Labs and Tests Ordered: Current medicines are reviewed at length with the patient today.  Concerns regarding medicines are outlined above.  No orders of the defined types were placed in this encounter.  No orders of the defined types were placed  in this encounter.    No chief complaint on file.    History of Present Illness:    Crystal Ramos is a 63 y.o. female.  Patient has past medical history of congestive heart failure, essential hypertension, diabetes mellitus, mixed dyslipidemia and obesity.  She has paroxysmal atrial fibrillation and has undergone cardioversion.  She complains of chest tightness on exertion and also shortness of breath on exertion.  She is converted into sinus rhythm but the symptoms have not gotten better.  At the time of my evaluation, the patient is alert awake oriented and in no distress.  Her diuretic dose was doubled by my partner when she saw her a few days ago and this has not helped the patient much with the above symptoms.  At the time of my evaluation, the patient is alert awake oriented and in no distress.  Past Medical History:  Diagnosis Date  . Acute diastolic heart failure ([place])   . Atrial fibrillation ([place])   . Chest pain   . Essential hypertension     Past Surgical History:  Procedure Laterality Date  . CHOLECYSTECTOMY    . MEDIAL PARTIAL KNEE REPLACEMENT      Current Medications: Current Meds  Medication Sig  . ALPRAZolam (XANAX) 0.25 MG tablet Take 1 tablet by mouth as needed.  Marland Kitchen amiodarone (PACERONE) 200 MG tablet Take 1 tablet (200 mg total) by mouth daily.  Marland Kitchen atorvastatin (LIPITOR) 40 MG tablet Take 40 mg by mouth daily.  . canagliflozin (INVOKANA) 300 MG TABS tablet Take 300 mg by mouth daily before breakfast.  . empagliflozin (JARDIANCE)  25 MG TABS tablet Take 25 mg by mouth daily.  Marland Kitchen escitalopram (LEXAPRO) 10 MG tablet Take 1 tablet by mouth daily.  . furosemide (LASIX) 40 MG tablet Take 1 tablet (40 mg total) by mouth 2 (two) times daily.  Marland Kitchen glimepiride (AMARYL) 4 MG tablet Take 4 mg by mouth 2 (two) times daily.   Marland Kitchen levothyroxine (SYNTHROID, LEVOTHROID) 50 MCG tablet Take 75 mcg by mouth daily.   Marland Kitchen linagliptin (TRADJENTA) 5 MG TABS tablet Take 5 mg by mouth daily.   Marland Kitchen  lisinopril (ZESTRIL) 40 MG tablet Take 40 mg by mouth daily.  . meclizine (ANTIVERT) 25 MG tablet Take 1 tablet by mouth daily.  . metFORMIN (GLUCOPHAGE) 850 MG tablet Take 850 mg by mouth 3 (three) times daily.  . metoprolol succinate (TOPROL-XL) 100 MG 24 hr tablet Take 50 mg by mouth daily. Take with or immediately following a meal.   . potassium chloride SA (KLOR-CON) 20 MEQ tablet Take 20 mEq by mouth daily.  . rivaroxaban (XARELTO) 20 MG TABS tablet Take 20 mg by mouth daily with supper.  . sitaGLIPtin (JANUVIA) 100 MG tablet Take 100 mg by mouth daily.     Allergies:   Patient has no known allergies.   Social History   Socioeconomic History  . Marital status: Married    Spouse name: Not on file  . Number of children: Not on file  . Years of education: Not on file  . Highest education level: Not on file  Occupational History  . Not on file  Social Needs  . Financial resource strain: Not on file  . Food insecurity    Worry: Not on file    Inability: Not on file  . Transportation needs    Medical: Not on file    Non-medical: Not on file  Tobacco Use  . Smoking status: Passive Smoke Exposure - Never Smoker  . Smokeless tobacco: Never Used  . Tobacco comment: "whole family" smoked in the house  Substance and Sexual Activity  . Alcohol use: No  . Drug use: No  . Sexual activity: Not on file  Lifestyle  . Physical activity    Days per week: Not on file    Minutes per session: Not on file  . Stress: Not on file  Relationships  . Social Herbalist on phone: Not on file    Gets together: Not on file    Attends religious service: Not on file    Active member of club or organization: Not on file    Attends meetings of clubs or organizations: Not on file    Relationship status: Not on file  Other Topics Concern  . Not on file  Social History Narrative  . Not on file     Family History: The patient's family history includes Breast cancer in her paternal  aunt; Diabetes in her mother; Stroke in her father and paternal aunt.  ROS:   Please see the history of present illness.    All other systems reviewed and are negative.  EKGs/Labs/Other Studies Reviewed:    The following studies were reviewed today: I discussed my findings with the patient at extensive length today.   Recent Labs: 05/31/2018: TSH 0.181 01/05/2019: ALT 15 03/08/2019: BUN 10; Creatinine, Ser 0.85; Magnesium 2.0; Potassium 3.5; Sodium 141  Recent Lipid Panel    Component Value Date/Time   CHOL 168 06/07/2017 0912   TRIG 107 06/07/2017 0912   HDL 60 06/07/2017 0912  CHOLHDL 2.8 06/07/2017 0912   LDLCALC 87 06/07/2017 0912    Physical Exam:    VS:  BP 122/78 (BP Location: Left Arm, Patient Position: Sitting, Cuff Size: Normal)   Pulse 61   Ht 5\' 2"  (1.575 m)   Wt 212 lb 6.4 oz (96.3 kg)   SpO2 96%   BMI 38.85 kg/m     Wt Readings from Last 3 Encounters:  03/27/19 212 lb 6.4 oz (96.3 kg)  03/08/19 219 lb (99.3 kg)  02/24/19 213 lb 6.4 oz (96.8 kg)     GEN: Patient is in no acute distress HEENT: Normal NECK: No JVD; No carotid bruits LYMPHATICS: No lymphadenopathy CARDIAC: Hear sounds regular, 2/6 systolic murmur at the apex. RESPIRATORY:  Clear to auscultation without rales, wheezing or rhonchi  ABDOMEN: Soft, non-tender, non-distended MUSCULOSKELETAL:  No edema; No deformity  SKIN: Warm and dry NEUROLOGIC:  Alert and oriented x 3 PSYCHIATRIC:  Normal affect   Signed, Jenean Lindau, MD  03/27/2019 12:00 PM    [facility]

## 2019-03-27 NOTE — Addendum Note (Signed)
Addended by: Beckey Rutter on: 03/27/2019 12:25 PM   Modules accepted: Orders

## 2019-03-28 ENCOUNTER — Telehealth: Payer: Self-pay

## 2019-03-28 DIAGNOSIS — R0602 Shortness of breath: Secondary | ICD-10-CM | POA: Diagnosis not present

## 2019-03-28 DIAGNOSIS — I209 Angina pectoris, unspecified: Secondary | ICD-10-CM | POA: Diagnosis not present

## 2019-03-28 DIAGNOSIS — Z01818 Encounter for other preprocedural examination: Secondary | ICD-10-CM | POA: Diagnosis not present

## 2019-03-28 LAB — CBC
Hematocrit: 41.9 % (ref 34.0–46.6)
Hemoglobin: 13.3 g/dL (ref 11.1–15.9)
MCH: 25 pg — ABNORMAL LOW (ref 26.6–33.0)
MCHC: 31.7 g/dL (ref 31.5–35.7)
MCV: 79 fL (ref 79–97)
Platelets: 185 10*3/uL (ref 150–450)
RBC: 5.33 x10E6/uL — ABNORMAL HIGH (ref 3.77–5.28)
RDW: 14.1 % (ref 11.7–15.4)
WBC: 6.1 10*3/uL (ref 3.4–10.8)

## 2019-03-28 LAB — BASIC METABOLIC PANEL
BUN/Creatinine Ratio: 16 (ref 12–28)
BUN: 14 mg/dL (ref 8–27)
CO2: 26 mmol/L (ref 20–29)
Calcium: 8.8 mg/dL (ref 8.7–10.3)
Chloride: 100 mmol/L (ref 96–106)
Creatinine, Ser: 0.88 mg/dL (ref 0.57–1.00)
GFR calc Af Amer: 80 mL/min/{1.73_m2} (ref >59)
GFR calc non Af Amer: 69 mL/min/{1.73_m2} (ref >59)
Glucose: 81 mg/dL (ref 65–99)
Potassium: 3.7 mmol/L (ref 3.5–5.2)
Sodium: 140 mmol/L (ref 134–144)

## 2019-03-28 LAB — MAGNESIUM: Magnesium: 2 mg/dL (ref 1.6–2.3)

## 2019-03-28 NOTE — Telephone Encounter (Signed)
-----   Message from Jenean Lindau, MD sent at 03/28/2019 11:40 AM EST ----- The results of the study is unremarkable. Please inform patient. I will discuss in detail at next appointment. Cc  primary care/referring physician Jenean Lindau, MD 03/28/2019 11:40 AM

## 2019-03-28 NOTE — Telephone Encounter (Signed)
No vm setup, will try to call patient later. Copy sent to Dr. Delena Bali

## 2019-03-29 ENCOUNTER — Telehealth: Payer: Self-pay

## 2019-03-29 NOTE — Telephone Encounter (Signed)
-----   Message from Jenean Lindau, MD sent at 03/28/2019 11:40 AM EST ----- The results of the study is unremarkable. Please inform patient. I will discuss in detail at next appointment. Cc  primary care/referring physician Jenean Lindau, MD 03/28/2019 11:40 AM

## 2019-03-29 NOTE — Telephone Encounter (Signed)
2nd call, no vm setup.

## 2019-03-30 ENCOUNTER — Other Ambulatory Visit (HOSPITAL_COMMUNITY)
Admission: RE | Admit: 2019-03-30 | Discharge: 2019-03-30 | Disposition: A | Discharge status: Home / Self Care | Payer: PPO | Source: Ambulatory Visit | Attending: Cardiovascular Disease | Admitting: Cardiovascular Disease

## 2019-03-30 ENCOUNTER — Telehealth: Payer: Self-pay | Admitting: *Deleted

## 2019-03-30 DIAGNOSIS — Z20828 Contact with and (suspected) exposure to other viral communicable diseases: Secondary | ICD-10-CM | POA: Insufficient documentation

## 2019-03-30 DIAGNOSIS — Z01812 Encounter for preprocedural laboratory examination: Secondary | ICD-10-CM | POA: Insufficient documentation

## 2019-03-30 NOTE — Telephone Encounter (Signed)
No answer, no voicemail.

## 2019-03-30 NOTE — Telephone Encounter (Addendum)
Pt contacted pre-catheterization scheduled at [facility] for: Monday April 03, 2019 7:30 AM Verified arrival time and place: [facility]) at: 5:30 AM   No solid food after midnight prior to cath, clear liquids until 5 AM day of procedure. Contrast allergy: no  Hold: Invokana-AM of procedure. Xarelto- none 04/01/19 until post procedure Metformin-day of procedure and 48 hours post procedure. Lasix-PM prior/AM of procedure.   Except hold medications AM meds can be  taken pre-cath with sip of water including: ASA 81 mg   Confirmed patient has responsible adult to drive home post procedure and observe 24 hours after arriving home: yes  Currently, due to Covid-19 pandemic, only one support person will be allowed with patient. Must be the same support person for that patient's entire stay, will be screened and required to wear a mask. They will be asked to wait in the waiting room for the duration of the patient's stay.  Patients are required to wear a mask when they enter the hospital.      COVID-19 Pre-Screening Questions:  . In the past 7 to 10 days have you had a cough,  shortness of breath, headache, congestion, fever (100 or greater) body aches, chills, sore throat, or sudden loss of taste or sense of smell? no . Have you been around anyone with known Covid 19? no . Have you been around anyone who is awaiting Covid 19 test results in the past 7 to 10 days? no . Have you been around anyone who has been exposed to Covid 19, or has mentioned symptoms of Covid 19 within the past 7 to 10 days? no  I reviewed procedure/mask/visitor instructions, Covid-19 screening questions with patient, she verbalized understanding, thanked me for call.

## 2019-03-31 ENCOUNTER — Other Ambulatory Visit: Payer: Self-pay | Admitting: Cardiology

## 2019-04-01 LAB — NOVEL CORONAVIRUS, NAA (HOSP ORDER, SEND-OUT TO REF LAB; TAT 18-24 HRS): SARS-CoV-2, NAA: NOT DETECTED

## 2019-04-03 ENCOUNTER — Encounter (HOSPITAL_COMMUNITY)
Admission: RE | Disposition: A | Discharge status: Home / Self Care | Payer: Self-pay | Source: Home / Self Care | Attending: Cardiovascular Disease

## 2019-04-03 ENCOUNTER — Other Ambulatory Visit: Payer: Self-pay

## 2019-04-03 ENCOUNTER — Ambulatory Visit (HOSPITAL_COMMUNITY)
Admission: RE | Admit: 2019-04-03 | Discharge: 2019-04-03 | Disposition: A | Discharge status: Home / Self Care | Payer: PPO | Attending: Cardiovascular Disease | Admitting: Cardiovascular Disease

## 2019-04-03 ENCOUNTER — Encounter (HOSPITAL_COMMUNITY): Payer: Self-pay | Admitting: Cardiovascular Disease

## 2019-04-03 DIAGNOSIS — I2510 Atherosclerotic heart disease of native coronary artery without angina pectoris: Secondary | ICD-10-CM

## 2019-04-03 DIAGNOSIS — Z79890 Hormone replacement therapy (postmenopausal): Secondary | ICD-10-CM | POA: Insufficient documentation

## 2019-04-03 DIAGNOSIS — I480 Paroxysmal atrial fibrillation: Secondary | ICD-10-CM | POA: Insufficient documentation

## 2019-04-03 DIAGNOSIS — I110 Hypertensive heart disease with heart failure: Secondary | ICD-10-CM | POA: Diagnosis not present

## 2019-04-03 DIAGNOSIS — Z7901 Long term (current) use of anticoagulants: Secondary | ICD-10-CM | POA: Diagnosis not present

## 2019-04-03 DIAGNOSIS — I5032 Chronic diastolic (congestive) heart failure: Secondary | ICD-10-CM | POA: Insufficient documentation

## 2019-04-03 DIAGNOSIS — Z6838 Body mass index (BMI) 38.0-38.9, adult: Secondary | ICD-10-CM | POA: Insufficient documentation

## 2019-04-03 DIAGNOSIS — Z79899 Other long term (current) drug therapy: Secondary | ICD-10-CM | POA: Diagnosis not present

## 2019-04-03 DIAGNOSIS — E088 Diabetes mellitus due to underlying condition with unspecified complications: Secondary | ICD-10-CM

## 2019-04-03 DIAGNOSIS — Z7984 Long term (current) use of oral hypoglycemic drugs: Secondary | ICD-10-CM | POA: Insufficient documentation

## 2019-04-03 DIAGNOSIS — E669 Obesity, unspecified: Secondary | ICD-10-CM | POA: Diagnosis not present

## 2019-04-03 DIAGNOSIS — E119 Type 2 diabetes mellitus without complications: Secondary | ICD-10-CM | POA: Insufficient documentation

## 2019-04-03 DIAGNOSIS — I7090 Unspecified atherosclerosis: Secondary | ICD-10-CM

## 2019-04-03 DIAGNOSIS — E782 Mixed hyperlipidemia: Secondary | ICD-10-CM | POA: Diagnosis not present

## 2019-04-03 DIAGNOSIS — I25110 Atherosclerotic heart disease of native coronary artery with unstable angina pectoris: Secondary | ICD-10-CM | POA: Insufficient documentation

## 2019-04-03 DIAGNOSIS — I209 Angina pectoris, unspecified: Secondary | ICD-10-CM

## 2019-04-03 DIAGNOSIS — I10 Essential (primary) hypertension: Secondary | ICD-10-CM

## 2019-04-03 HISTORY — PX: LEFT HEART CATH AND CORONARY ANGIOGRAPHY: CATH118249

## 2019-04-03 LAB — GLUCOSE, CAPILLARY
Glucose-Capillary: 131 mg/dL — ABNORMAL HIGH (ref 70–99)
Glucose-Capillary: 124 mg/dL — ABNORMAL HIGH (ref 70–99)

## 2019-04-03 SURGERY — LEFT HEART CATH AND CORONARY ANGIOGRAPHY
Anesthesia: LOCAL

## 2019-04-03 MED ORDER — ASPIRIN 81 MG PO CHEW: 81.0000 mg | CHEWABLE_TABLET | ORAL | Status: DC

## 2019-04-03 MED ORDER — SODIUM CHLORIDE 0.9% FLUSH: 3.0000 mL | Freq: Two times a day (BID) | INTRAVENOUS | Status: DC

## 2019-04-03 MED ORDER — ACETAMINOPHEN 325 MG PO TABS: 650.0000 mg | ORAL_TABLET | ORAL | Status: DC | PRN

## 2019-04-03 MED ORDER — HEPARIN (PORCINE) IN NACL 1000-0.9 UT/500ML-% IV SOLN
INTRAVENOUS | Status: DC | PRN
  Administered 2019-04-03 (×2): 500 mL

## 2019-04-03 MED ORDER — HEPARIN SODIUM (PORCINE) 1000 UNIT/ML IJ SOLN
INTRAMUSCULAR | Status: DC | PRN
  Administered 2019-04-03: 5000 [IU] via INTRAVENOUS

## 2019-04-03 MED ORDER — SODIUM CHLORIDE 0.9% FLUSH: 3.0000 mL | INTRAVENOUS | Status: DC | PRN

## 2019-04-03 MED ORDER — VERAPAMIL HCL 2.5 MG/ML IV SOLN
INTRAVENOUS | Status: DC | PRN
  Administered 2019-04-03: 08:00:00 10 mL via INTRA_ARTERIAL

## 2019-04-03 MED ORDER — IOHEXOL 350 MG/ML SOLN
INTRAVENOUS | Status: DC | PRN
  Administered 2019-04-03: 75 mL via INTRACARDIAC

## 2019-04-03 MED ORDER — SODIUM CHLORIDE 0.9 % WEIGHT BASED INFUSION: 1.0000 mL/kg/h | INTRAVENOUS | Status: DC

## 2019-04-03 MED ORDER — SODIUM CHLORIDE 0.9 % IV SOLN: 250.0000 mL | INTRAVENOUS | Status: DC | PRN

## 2019-04-03 MED ORDER — HYDRALAZINE HCL 20 MG/ML IJ SOLN: 10.0000 mg | INTRAMUSCULAR | Status: DC | PRN

## 2019-04-03 MED ORDER — SODIUM CHLORIDE 0.9 % WEIGHT BASED INFUSION
3.0000 mL/kg/h | INTRAVENOUS | Status: AC
  Administered 2019-04-03: 07:00:00 3 mL/kg/h via INTRAVENOUS

## 2019-04-03 MED ORDER — ONDANSETRON HCL 4 MG/2ML IJ SOLN: 4.0000 mg | Freq: Four times a day (QID) | INTRAMUSCULAR | Status: DC | PRN

## 2019-04-03 MED ORDER — LABETALOL HCL 5 MG/ML IV SOLN: 10.0000 mg | INTRAVENOUS | Status: DC | PRN

## 2019-04-03 MED ORDER — FENTANYL CITRATE (PF) 100 MCG/2ML IJ SOLN
INTRAMUSCULAR | Status: DC | PRN
  Administered 2019-04-03: 25 ug via INTRAVENOUS

## 2019-04-03 MED ORDER — HEPARIN SODIUM (PORCINE) 1000 UNIT/ML IJ SOLN
INTRAMUSCULAR | Status: AC
  Filled 2019-04-03: qty 1

## 2019-04-03 MED ORDER — SODIUM CHLORIDE 0.9 % IV SOLN: INTRAVENOUS | Status: AC

## 2019-04-03 MED ORDER — FUROSEMIDE 10 MG/ML IJ SOLN
INTRAMUSCULAR | Status: DC | PRN
  Administered 2019-04-03: 40 mg via INTRAVENOUS

## 2019-04-03 MED ORDER — FENTANYL CITRATE (PF) 100 MCG/2ML IJ SOLN
INTRAMUSCULAR | Status: AC
  Filled 2019-04-03: qty 2

## 2019-04-03 MED ORDER — MIDAZOLAM HCL 2 MG/2ML IJ SOLN
INTRAMUSCULAR | Status: DC | PRN
  Administered 2019-04-03: 1 mg via INTRAVENOUS

## 2019-04-03 MED ORDER — LIDOCAINE HCL (PF) 1 % IJ SOLN
INTRAMUSCULAR | Status: AC
  Filled 2019-04-03: qty 30

## 2019-04-03 MED ORDER — LIDOCAINE HCL (PF) 1 % IJ SOLN
INTRAMUSCULAR | Status: DC | PRN
  Administered 2019-04-03: 2 mL via INTRADERMAL

## 2019-04-03 MED ORDER — MIDAZOLAM HCL 2 MG/2ML IJ SOLN
INTRAMUSCULAR | Status: AC
  Filled 2019-04-03: qty 2

## 2019-04-03 MED ORDER — HEPARIN (PORCINE) IN NACL 1000-0.9 UT/500ML-% IV SOLN
INTRAVENOUS | Status: AC
  Filled 2019-04-03: qty 1000

## 2019-04-03 MED ORDER — VERAPAMIL HCL 2.5 MG/ML IV SOLN
INTRAVENOUS | Status: AC
  Filled 2019-04-03: qty 2

## 2019-04-03 MED ORDER — FUROSEMIDE 10 MG/ML IJ SOLN
INTRAMUSCULAR | Status: AC
  Filled 2019-04-03: qty 4

## 2019-04-03 SURGICAL SUPPLY — 9 items

## 2019-04-03 NOTE — Progress Notes (Signed)
Discharge instructions reviewed with pt and her daughter, Both voice understanding.

## 2019-04-03 NOTE — Interval H&P Note (Signed)
History and Physical Interval Note:  04/03/2019 7:18 AM  Crystal Ramos  has presented today for surgery, with the diagnosis of unstable angina.  The various methods of treatment have been discussed with the patient and family. After consideration of risks, benefits and other options for treatment, the patient has consented to  Procedure(s): LEFT HEART CATH AND CORONARY ANGIOGRAPHY (N/A) as a surgical intervention.  The patient's history has been reviewed, patient examined, no change in status, stable for surgery.  I have reviewed the patient's chart and labs.  Questions were answered to the patient's satisfaction.    Cath Lab Visit (complete for each Cath Lab visit)  Clinical Evaluation Leading to the Procedure:   ACS: No.  Non-ACS:    Anginal Classification: CCS III  Anti-ischemic medical therapy: No Therapy  Non-Invasive Test Results: No non-invasive testing performed  Prior CABG: No previous CABG         Lauree Chandler

## 2019-04-03 NOTE — Progress Notes (Addendum)
Pt has ambulated several times to the bathroom tol well.

## 2019-04-03 NOTE — Discharge Instructions (Signed)
Radial Site Care ° °This sheet gives you information about how to care for yourself after your procedure. Your health care provider may also give you more specific instructions. If you have problems or questions, contact your health care provider. °What can I expect after the procedure? °After the procedure, it is common to have: °· Bruising and tenderness at the catheter insertion area. °Follow these instructions at home: °Medicines °· Take over-the-counter and prescription medicines only as told by your health care provider. °Insertion site care °· Follow instructions from your health care provider about how to take care of your insertion site. Make sure you: °? Wash your hands with soap and water before you change your bandage (dressing). If soap and water are not available, use hand sanitizer. °? Change your dressing as told by your health care provider. °? Leave stitches (sutures), skin glue, or adhesive strips in place. These skin closures may need to stay in place for 2 weeks or longer. If adhesive strip edges start to loosen and curl up, you may trim the loose edges. Do not remove adhesive strips completely unless your health care provider tells you to do that. °· Check your insertion site every day for signs of infection. Check for: °? Redness, swelling, or pain. °? Fluid or blood. °? Pus or a bad smell. °? Warmth. °· Do not take baths, swim, or use a hot tub until your health care provider approves. °· You may shower 24-48 hours after the procedure, or as directed by your health care provider. °? Remove the dressing and gently wash the site with plain soap and water. °? Pat the area dry with a clean towel. °? Do not rub the site. That could cause bleeding. °· Do not apply powder or lotion to the site. °Activity ° °· For 24 hours after the procedure, or as directed by your health care provider: °? Do not flex or bend the affected arm. °? Do not push or pull heavy objects with the affected arm. °? Do not  drive yourself home from the hospital or clinic. You may drive 24 hours after the procedure unless your health care provider tells you not to. °? Do not operate machinery or power tools. °· Do not lift anything that is heavier than 10 lb (4.5 kg), or the limit that you are told, until your health care provider says that it is safe. °· Ask your health care provider when it is okay to: °? Return to work or school. °? Resume usual physical activities or sports. °? Resume sexual activity. °General instructions °· If the catheter site starts to bleed, raise your arm and put firm pressure on the site. If the bleeding does not stop, get help right away. This is a medical emergency. °· If you went home on the same day as your procedure, a responsible adult should be with you for the first 24 hours after you arrive home. °· Keep all follow-up visits as told by your health care provider. This is important. °Contact a health care provider if: °· You have a fever. °· You have redness, swelling, or yellow drainage around your insertion site. °Get help right away if: °· You have unusual pain at the radial site. °· The catheter insertion area swells very fast. °· The insertion area is bleeding, and the bleeding does not stop when you hold steady pressure on the area. °· Your arm or hand becomes pale, cool, tingly, or numb. °These symptoms may represent a serious problem   that is an emergency. Do not wait to see if the symptoms will go away. Get medical help right away. Call your local emergency services (911 in the [place] Do not drive yourself to the hospital. Summary  After the procedure, it is common to have bruising and tenderness at the site.  Follow instructions from your health care provider about how to take care of your radial site wound. Check the wound every day for signs of infection.  Do not lift anything that is heavier than 10 lb (4.5 kg), or the limit that you are told, until your health care provider says  that it is safe. This information is not intended to replace advice given to you by your health care provider. Make sure you discuss any questions you have with your health care provider. Document Released: 06/06/2010 Document Revised: 06/09/2017 Document Reviewed: 06/09/2017 Elsevier Patient Education  [place]  . Increase Lasix to 40 mg po BID.

## 2019-04-06 ENCOUNTER — Other Ambulatory Visit: Payer: Self-pay | Admitting: Cardiology

## 2019-04-06 NOTE — Telephone Encounter (Signed)
Metoprolol refill sent to [facility] in [place] 

## 2019-04-10 DIAGNOSIS — R748 Abnormal levels of other serum enzymes: Secondary | ICD-10-CM | POA: Diagnosis not present

## 2019-04-10 DIAGNOSIS — R7989 Other specified abnormal findings of blood chemistry: Secondary | ICD-10-CM | POA: Diagnosis not present

## 2019-04-24 DIAGNOSIS — R748 Abnormal levels of other serum enzymes: Secondary | ICD-10-CM | POA: Diagnosis not present

## 2019-04-24 DIAGNOSIS — K760 Fatty (change of) liver, not elsewhere classified: Secondary | ICD-10-CM | POA: Diagnosis not present

## 2019-04-24 DIAGNOSIS — R7989 Other specified abnormal findings of blood chemistry: Secondary | ICD-10-CM | POA: Diagnosis not present

## 2019-04-27 DIAGNOSIS — L400 Psoriasis vulgaris: Secondary | ICD-10-CM | POA: Diagnosis not present

## 2019-04-27 DIAGNOSIS — R531 Weakness: Secondary | ICD-10-CM | POA: Diagnosis not present

## 2019-05-04 ENCOUNTER — Other Ambulatory Visit: Payer: Self-pay

## 2019-05-04 ENCOUNTER — Ambulatory Visit (INDEPENDENT_AMBULATORY_CARE_PROVIDER_SITE_OTHER): Payer: PPO | Admitting: Cardiology

## 2019-05-04 ENCOUNTER — Encounter: Payer: Self-pay | Admitting: Cardiology

## 2019-05-04 VITALS — BP 126/64 | HR 59 | Ht 62.0 in | Wt 216.8 lb

## 2019-05-04 DIAGNOSIS — E785 Hyperlipidemia, unspecified: Secondary | ICD-10-CM | POA: Diagnosis not present

## 2019-05-04 DIAGNOSIS — I10 Essential (primary) hypertension: Secondary | ICD-10-CM

## 2019-05-04 DIAGNOSIS — I7090 Unspecified atherosclerosis: Secondary | ICD-10-CM | POA: Diagnosis not present

## 2019-05-04 DIAGNOSIS — E119 Type 2 diabetes mellitus without complications: Secondary | ICD-10-CM

## 2019-05-04 DIAGNOSIS — I2510 Atherosclerotic heart disease of native coronary artery without angina pectoris: Secondary | ICD-10-CM

## 2019-05-04 DIAGNOSIS — E088 Diabetes mellitus due to underlying condition with unspecified complications: Secondary | ICD-10-CM

## 2019-05-04 DIAGNOSIS — I480 Paroxysmal atrial fibrillation: Secondary | ICD-10-CM

## 2019-05-04 NOTE — Patient Instructions (Signed)
Medication Instructions:  Your physician recommends that you continue on your current medications as directed. Please refer to the Current Medication list given to you today.  *If you need a refill on your cardiac medications before your next appointment, please call your pharmacy*  Lab Work: Your physician recommends that you return for lab work  A few days prior to your next visit:  BMET TSH LFT's Lipids  If you have labs (blood work) drawn today and your tests are completely normal, you will receive your results only by: Marland Kitchen MyChart Message (if you have MyChart) OR . A paper copy in the mail If you have any lab test that is abnormal or we need to change your treatment, we will call you to review the results.  Testing/Procedures: None Ordered   Follow-Up: At [facility], you and your health needs are our priority.  As part of our continuing mission to provide you with exceptional heart care, we have created designated Provider Care Teams.  These Care Teams include your primary Cardiologist (physician) and Advanced Practice Providers (APPs -  Physician Assistants and Nurse Practitioners) who all work together to provide you with the care you need, when you need it.  Your next appointment:   3 month(s)  The format for your next appointment:   In Person  Provider:   Jyl Heinz, MD

## 2019-05-04 NOTE — Progress Notes (Signed)
Cardiology Office Note:    Date:  05/04/2019   ID:  Crystal Ramos, DOB 30-Oct-1955, MRN [id number]  PCP:  Nicoletta Dress, MD  Cardiologist:  Jenean Lindau, MD   Referring MD: Nicoletta Dress, MD    ASSESSMENT:    1. Coronary artery disease involving native coronary artery of native heart without angina pectoris   2. Essential hypertension   3. Paroxysmal atrial fibrillation (HCC)   4. Atherosclerotic vascular disease   5. Type 2 diabetes mellitus without complication, without long-term current use of insulin ([place])   6. Morbid obesity ([place])   7. Dyslipidemia   8. Diabetes mellitus due to underlying condition with unspecified complications ([place])    PLAN:    In order of problems listed above:  1. Coronary artery disease: Report was discussed with the patient extensively.  Secondary prevention stressed with the patient.  Importance of compliance with diet and medication stressed and she vocalized understanding.  Importance of regular exercise stressed and outlined in detail.  Weight reduction was stressed and diet was emphasized.  Risks of obesity explained. 2. Essential hypertension: Blood pressure is stable 3. Mixed dyslipidemia and diabetes mellitus: Diet was discussed.  This is managed by her primary care physician. 4. Paroxysmal atrial fibrillation:I discussed with the patient atrial fibrillation, disease process. Management and therapy including rate and rhythm control, anticoagulation benefits and potential risks were discussed extensively with the patient. Patient had multiple questions which were answered to patient's satisfaction. 5. Patient will be seen in follow-up appointment in 6 months or earlier if the patient has any concerns    Medication Adjustments/Labs and Tests Ordered: Current medicines are reviewed at length with the patient today.  Concerns regarding medicines are outlined above.  No orders of the defined types were placed in this encounter.  No  orders of the defined types were placed in this encounter.    Chief Complaint  Patient presents with  . Follow up on Cath     History of Present Illness:    Crystal Ramos is a 63 y.o. female.  Patient has past medical history of paroxysmal atrial fibrillation, essential hypertension and dyslipidemia.  She is a diabetic.  Because of chest tightness symptoms she underwent coronary angiography with obstructive disease not amenable to intervention because of small vessel size.  She also has additional multiple nonobstructive disease.  She is here for follow-up and denies any problems.  Her chest tightness is better.  No orthopnea or PND.  At the time of my evaluation, the patient is alert awake oriented and in no distress. Past Medical History:  Diagnosis Date  . Acute diastolic heart failure ([place])   . Atrial fibrillation ([place])   . Chest pain   . Essential hypertension     Past Surgical History:  Procedure Laterality Date  . CHOLECYSTECTOMY    . LEFT HEART CATH AND CORONARY ANGIOGRAPHY N/A 04/03/2019   Procedure: LEFT HEART CATH AND CORONARY ANGIOGRAPHY;  Surgeon: Burnell Blanks, MD;  Location: [facility];  Service: Cardiovascular;  Laterality: N/A;  . MEDIAL PARTIAL KNEE REPLACEMENT      Current Medications: Current Meds  Medication Sig  . ALPRAZolam (XANAX) 0.25 MG tablet Take 0.25 mg by mouth at bedtime.   Marland Kitchen amiodarone (PACERONE) 100 MG tablet Take 100 mg by mouth daily.  Marland Kitchen aspirin EC 81 MG tablet Take 81 mg by mouth daily.  Marland Kitchen atorvastatin (LIPITOR) 40 MG tablet Take 40 mg by mouth daily.  Marland Kitchen  canagliflozin (INVOKANA) 300 MG TABS tablet Take 300 mg by mouth daily before breakfast.  . furosemide (LASIX) 40 MG tablet Take 1 tablet (40 mg total) by mouth 2 (two) times daily. (Patient taking differently: Take 40 mg by mouth daily. )  . levothyroxine (SYNTHROID, LEVOTHROID) 50 MCG tablet Take 50 mcg by mouth daily before breakfast.   . meclizine (ANTIVERT) 25 MG tablet  Take 25 mg by mouth 3 (three) times daily.   . metFORMIN (GLUCOPHAGE) 850 MG tablet Take 850 mg by mouth 3 (three) times daily.  . metoprolol succinate (TOPROL-XL) 25 MG 24 hr tablet Take 1 tablet by mouth once daily  . nitroGLYCERIN (NITROSTAT) 0.4 MG SL tablet Place 1 tablet (0.4 mg total) under the tongue every 5 (five) minutes as needed.  . rivaroxaban (XARELTO) 20 MG TABS tablet Take 20 mg by mouth daily with supper.     Allergies:   Patient has no known allergies.   Social History   Socioeconomic History  . Marital status: Married    Spouse name: Not on file  . Number of children: Not on file  . Years of education: Not on file  . Highest education level: Not on file  Occupational History  . Not on file  Tobacco Use  . Smoking status: Passive Smoke Exposure - Never Smoker  . Smokeless tobacco: Never Used  . Tobacco comment: "whole family" smoked in the house  Substance and Sexual Activity  . Alcohol use: No  . Drug use: No  . Sexual activity: Not on file  Other Topics Concern  . Not on file  Social History Narrative  . Not on file   Social Determinants of Health   Financial Resource Strain:   . Difficulty of Paying Living Expenses: Not on file  Food Insecurity:   . Worried About Charity fundraiser in the Last Year: Not on file  . Ran Out of Food in the Last Year: Not on file  Transportation Needs:   . Lack of Transportation (Medical): Not on file  . Lack of Transportation (Non-Medical): Not on file  Physical Activity:   . Days of Exercise per Week: Not on file  . Minutes of Exercise per Session: Not on file  Stress:   . Feeling of Stress : Not on file  Social Connections:   . Frequency of Communication with Friends and Family: Not on file  . Frequency of Social Gatherings with Friends and Family: Not on file  . Attends Religious Services: Not on file  . Active Member of Clubs or Organizations: Not on file  . Attends Archivist Meetings: Not on file    . Marital Status: Not on file     Family History: The patient's family history includes Breast cancer in her paternal aunt; Diabetes in her mother; Stroke in her father and paternal aunt.  ROS:   Please see the history of present illness.    All other systems reviewed and are negative.  EKGs/Labs/Other Studies Reviewed:    The following studies were reviewed today: Burnell Blanks, MD Study date: 04/03/19  MyChart Results Release  MyChart Status: Pending Results Release  Physicians  Panel Physicians Referring Physician Case Authorizing Physician  Burnell Blanks, MD (Primary)    Procedures  LEFT HEART CATH AND CORONARY ANGIOGRAPHY  Conclusion    RV Branch lesion is 95% stenosed.  Ost Cx to Prox Cx lesion is 30% stenosed.  Ramus lesion is 30% stenosed.  Mid LAD-1 lesion  is 30% stenosed.  Mid LAD-2 lesion is 50% stenosed.  Mid LAD to Dist LAD lesion is 30% stenosed.   1. Mild to moderate non-obstructive disease in the mid LAD 2. Mild non-obstructive disease in the intermediate branch and the Circumflex 3. The RCA is a large dominant vessel with no obstructive in the major segment or distal branches. The small RV marginal branch has a 95% ostial stenosis (too small for PCI).  4. Elevated LVEDP  Recommendations: Medical management of CAD. I would increase her Lasix dosage to 40 mg po BID. She was given Lasix 40 mg IV x 1 in the cath lab.        Recent Labs: 05/31/2018: TSH 0.181 01/05/2019: ALT 15 03/27/2019: BUN 14; Creatinine, Ser 0.88; Hemoglobin 13.3; Magnesium 2.0; Platelets 185; Potassium 3.7; Sodium 140  Recent Lipid Panel    Component Value Date/Time   CHOL 168 06/07/2017 0912   TRIG 107 06/07/2017 0912   HDL 60 06/07/2017 0912   CHOLHDL 2.8 06/07/2017 0912   LDLCALC 87 06/07/2017 0912    Physical Exam:    VS:  BP 126/64   Pulse (!) 59   Ht 5\' 2"  (1.575 m)   Wt 216 lb 12.8 oz (98.3 kg)   SpO2 98%   BMI 39.65 kg/m     Wt  Readings from Last 3 Encounters:  05/04/19 216 lb 12.8 oz (98.3 kg)  04/03/19 213 lb (96.6 kg)  03/27/19 212 lb 6.4 oz (96.3 kg)     GEN: Patient is in no acute distress HEENT: Normal NECK: No JVD; No carotid bruits LYMPHATICS: No lymphadenopathy CARDIAC: Hear sounds regular, 2/6 systolic murmur at the apex. RESPIRATORY:  Clear to auscultation without rales, wheezing or rhonchi  ABDOMEN: Soft, non-tender, non-distended MUSCULOSKELETAL:  No edema; No deformity  SKIN: Warm and dry NEUROLOGIC:  Alert and oriented x 3 PSYCHIATRIC:  Normal affect   Signed, Jenean Lindau, MD  05/04/2019 11:17 AM    [place]

## 2019-05-05 DIAGNOSIS — E039 Hypothyroidism, unspecified: Secondary | ICD-10-CM | POA: Diagnosis not present

## 2019-05-09 DIAGNOSIS — Z1231 Encounter for screening mammogram for malignant neoplasm of breast: Secondary | ICD-10-CM | POA: Diagnosis not present

## 2019-06-01 DIAGNOSIS — I480 Paroxysmal atrial fibrillation: Secondary | ICD-10-CM | POA: Diagnosis not present

## 2019-06-01 DIAGNOSIS — E039 Hypothyroidism, unspecified: Secondary | ICD-10-CM | POA: Diagnosis not present

## 2019-06-01 DIAGNOSIS — E785 Hyperlipidemia, unspecified: Secondary | ICD-10-CM | POA: Diagnosis not present

## 2019-06-01 DIAGNOSIS — E1169 Type 2 diabetes mellitus with other specified complication: Secondary | ICD-10-CM | POA: Diagnosis not present

## 2019-06-01 DIAGNOSIS — F411 Generalized anxiety disorder: Secondary | ICD-10-CM | POA: Diagnosis not present

## 2019-06-06 ENCOUNTER — Other Ambulatory Visit: Payer: Self-pay | Admitting: Cardiology

## 2019-06-26 DIAGNOSIS — R928 Other abnormal and inconclusive findings on diagnostic imaging of breast: Secondary | ICD-10-CM | POA: Diagnosis not present

## 2019-06-26 DIAGNOSIS — R921 Mammographic calcification found on diagnostic imaging of breast: Secondary | ICD-10-CM | POA: Diagnosis not present

## 2019-06-29 ENCOUNTER — Telehealth: Payer: Self-pay | Admitting: Cardiology

## 2019-06-29 NOTE — Telephone Encounter (Signed)
Pl get me details of procedure and clearance form

## 2019-06-29 NOTE — Telephone Encounter (Signed)
Patient is calling to inform Dr. Geraldo Pitter of a procedure that she is having on her right breast. I have advised the patient to contact the office/provider performing the procedure to call the office and request surgical clearance.

## 2019-07-04 NOTE — Telephone Encounter (Signed)
Attempted to contact pt. Unable to leave message as phone continues to ring.

## 2019-07-06 NOTE — Telephone Encounter (Signed)
Patient says that she is having a right breast biopsy at [facility] and wanted to know if she needed to stop her xarelto. Advised that specific medication instructions would come directly from the provider doing the procedure and if further questions or concerns were needed from cardiology, to have the provider doing the biopsy contact our office directly. Verbalized understanding of plan.

## 2019-07-20 DIAGNOSIS — N6011 Diffuse cystic mastopathy of right breast: Secondary | ICD-10-CM | POA: Diagnosis not present

## 2019-07-20 DIAGNOSIS — D0511 Intraductal carcinoma in situ of right breast: Secondary | ICD-10-CM | POA: Diagnosis not present

## 2019-07-20 DIAGNOSIS — R921 Mammographic calcification found on diagnostic imaging of breast: Secondary | ICD-10-CM | POA: Diagnosis not present

## 2019-07-26 DIAGNOSIS — D0511 Intraductal carcinoma in situ of right breast: Secondary | ICD-10-CM | POA: Diagnosis not present

## 2019-07-31 ENCOUNTER — Other Ambulatory Visit: Payer: Self-pay | Admitting: Surgery

## 2019-07-31 DIAGNOSIS — D0511 Intraductal carcinoma in situ of right breast: Secondary | ICD-10-CM

## 2019-08-01 DIAGNOSIS — D0511 Intraductal carcinoma in situ of right breast: Secondary | ICD-10-CM

## 2019-08-01 HISTORY — DX: Intraductal carcinoma in situ of right breast: D05.11

## 2019-08-02 DIAGNOSIS — D0511 Intraductal carcinoma in situ of right breast: Secondary | ICD-10-CM | POA: Diagnosis not present

## 2019-08-03 ENCOUNTER — Other Ambulatory Visit: Payer: Self-pay

## 2019-08-03 ENCOUNTER — Ambulatory Visit
Admission: RE | Admit: 2019-08-03 | Discharge: 2019-08-03 | Disposition: A | Discharge status: Home / Self Care | Payer: PPO | Source: Ambulatory Visit | Attending: Surgery | Admitting: Surgery

## 2019-08-03 DIAGNOSIS — D0511 Intraductal carcinoma in situ of right breast: Secondary | ICD-10-CM

## 2019-08-03 MED ORDER — GADOBUTROL 1 MMOL/ML IV SOLN
10.0000 mL | Freq: Once | INTRAVENOUS | Status: AC | PRN
  Administered 2019-08-03: 10 mL via INTRAVENOUS

## 2019-08-07 ENCOUNTER — Encounter: Payer: Self-pay | Admitting: Cardiology

## 2019-08-07 ENCOUNTER — Other Ambulatory Visit: Payer: Self-pay

## 2019-08-07 ENCOUNTER — Ambulatory Visit (INDEPENDENT_AMBULATORY_CARE_PROVIDER_SITE_OTHER): Payer: PPO | Admitting: Cardiology

## 2019-08-07 VITALS — BP 138/70 | HR 54 | Ht 62.0 in | Wt 224.0 lb

## 2019-08-07 DIAGNOSIS — E785 Hyperlipidemia, unspecified: Secondary | ICD-10-CM

## 2019-08-07 DIAGNOSIS — I2510 Atherosclerotic heart disease of native coronary artery without angina pectoris: Secondary | ICD-10-CM

## 2019-08-07 DIAGNOSIS — E088 Diabetes mellitus due to underlying condition with unspecified complications: Secondary | ICD-10-CM | POA: Diagnosis not present

## 2019-08-07 DIAGNOSIS — I480 Paroxysmal atrial fibrillation: Secondary | ICD-10-CM

## 2019-08-07 MED ORDER — PACERONE 200 MG PO TABS: 100.0000 mg | ORAL_TABLET | Freq: Every day | ORAL | 3 refills | Status: DC

## 2019-08-07 NOTE — Patient Instructions (Signed)
Medication Instructions:  Your physician has recommended you make the following change in your medication:   Decrease Amiodarone to 100 mg, take 1/2 tablet of the Amiodarone 200 mg.  *If you need a refill on your cardiac medications before your next appointment, please call your pharmacy*   Lab Work: None ordered If you have labs (blood work) drawn today and your tests are completely normal, you will receive your results only by: Marland Kitchen MyChart Message (if you have MyChart) OR . A paper copy in the mail If you have any lab test that is abnormal or we need to change your treatment, we will call you to review the results.   Testing/Procedures:  none ordered   Follow-Up: At [facility], you and your health needs are our priority.  As part of our continuing mission to provide you with exceptional heart care, we have created designated Provider Care Teams.  These Care Teams include your primary Cardiologist (physician) and Advanced Practice Providers (APPs -  Physician Assistants and Nurse Practitioners) who all work together to provide you with the care you need, when you need it.  We recommend signing up for the patient portal called "MyChart".  Sign up information is provided on this After Visit Summary.  MyChart is used to connect with patients for Virtual Visits (Telemedicine).  Patients are able to view lab/test results, encounter notes, upcoming appointments, etc.  Non-urgent messages can be sent to your provider as well.   To learn more about what you can do with MyChart, go to [email or web address].    Your next appointment:   2 month(s)  The format for your next appointment:   In Person  Provider:   Jyl Heinz, MD   Other Instructions NA

## 2019-08-07 NOTE — Progress Notes (Signed)
Cardiology Office Note:    Date:  08/07/2019   ID:  [name], DOB February 21, 1956, MRN [id number]  PCP:  Nicoletta Dress, MD  Cardiologist:  Jenean Lindau, MD   Referring MD: Nicoletta Dress, MD    ASSESSMENT:    1. Coronary artery disease involving native coronary artery of native heart without angina pectoris   2. Paroxysmal atrial fibrillation (HCC)   3. Diabetes mellitus due to underlying condition with unspecified complications ([place])   4. Dyslipidemia   5. Morbid obesity ([place])    PLAN:    In order of problems listed above:  1. Coronary artery disease: Secondary prevention stressed with the patient.  Importance of compliance with diet and medication stressed and she vocalized understanding. 2. Paroxysmal atrial fibrillation:I discussed with the patient atrial fibrillation, disease process. Management and therapy including rate and rhythm control, anticoagulation benefits and potential risks were discussed extensively with the patient. Patient had multiple questions which were answered to patient's satisfaction.  In view of her bradycardia I reduced her amiodarone dose to 200 mg daily. 3. Essential hypertension: Blood pressure is stable 4. Mixed dyslipidemia: Diet was discussed and she promises to do better. 5. Morbid obesity: Diet was discussed and she is doing to do her best after her cancer surgery to get into shape and exercise. 6. Patient will be seen in follow-up appointment in 6 months or earlier if the patient has any concerns    Medication Adjustments/Labs and Tests Ordered: Current medicines are reviewed at length with the patient today.  Concerns regarding medicines are outlined above.  No orders of the defined types were placed in this encounter.  No orders of the defined types were placed in this encounter.    Chief Complaint  Patient presents with  . Follow-up    3 Months     History of Present Illness:    Crystal Ramos is a 64 y.o. female.   Patient has history of paroxysmal atrial fibrillation, coronary artery disease, congestive heart failure and dyslipidemia.  She denies any problems at this time and takes care of activities of daily living.  No chest pain orthopnea or PND.  She has been recently diagnosed with breast cancer early stage and is planning to undergo surgery in the next week or so.  At the time of my evaluation, the patient is alert awake oriented and in no distress.  Past Medical History:  Diagnosis Date  . Acute diastolic heart failure ([place])   . Angina pectoris ([place]) 03/27/2019  . 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 Atrial fibrillation ([place])   . Bilateral carotid bruits 01/05/2019  . Chest pain   . Chronic diastolic heart failure ([place]) 03/08/2019  . Coronary artery disease involving native coronary artery of native heart without angina pectoris   . Diabetes mellitus due to underlying condition with unspecified complications ([place]) [id number]  . Dyslipidemia 02/25/2017  . Dyspnea on exertion 12/18/2015  . Essential hypertension   . Hematuria 11/29/2017  . Morbid obesity ([place]) 02/25/2017  . Paroxysmal atrial fibrillation ([place]) 02/25/2017  . Type 2 diabetes mellitus without complication ([place]) [id number]    Past Surgical History:  Procedure Laterality Date  . CHOLECYSTECTOMY    . LEFT HEART CATH AND CORONARY ANGIOGRAPHY N/A 04/03/2019   Procedure: LEFT HEART CATH AND CORONARY ANGIOGRAPHY;  Surgeon: Burnell Blanks, MD;  Location: [place] [facility];  Service: Cardiovascular;  Laterality: N/A;  . MEDIAL PARTIAL KNEE REPLACEMENT      Current  Medications: Current Meds  Medication Sig  . ALPRAZolam (XANAX) 0.25 MG tablet Take 0.25 mg by mouth at bedtime.   Marland Kitchen aspirin EC 81 MG tablet Take 81 mg by mouth daily.  Marland Kitchen atorvastatin (LIPITOR) 40 MG tablet Take 40 mg by mouth daily.  . canagliflozin (INVOKANA) 300 MG TABS tablet Take 300 mg by mouth daily before breakfast.  . EUTHYROX 88 MCG tablet Take 88  mcg by mouth daily.  . furosemide (LASIX) 40 MG tablet Take 1 tablet (40 mg total) by mouth 2 (two) times daily. (Patient taking differently: Take 40 mg by mouth daily. )  . levothyroxine (SYNTHROID, LEVOTHROID) 50 MCG tablet Take 50 mcg by mouth daily before breakfast.   . meclizine (ANTIVERT) 25 MG tablet Take 25 mg by mouth 3 (three) times daily.   . metFORMIN (GLUCOPHAGE) 850 MG tablet Take 850 mg by mouth 3 (three) times daily.  . metoprolol succinate (TOPROL-XL) 25 MG 24 hr tablet Take 1 tablet by mouth once daily  . nitroGLYCERIN (NITROSTAT) 0.4 MG SL tablet Place 1 tablet (0.4 mg total) under the tongue every 5 (five) minutes as needed.  Marland Kitchen PACERONE 200 MG tablet Take 200 mg by mouth daily.  . rivaroxaban (XARELTO) 20 MG TABS tablet Take 20 mg by mouth daily with supper.     Allergies:   Patient has no known allergies.   Social History   Socioeconomic History  . Marital status: Married    Spouse name: Not on file  . Number of children: Not on file  . Years of education: Not on file  . Highest education level: Not on file  Occupational History  . Not on file  Tobacco Use  . Smoking status: Passive Smoke Exposure - Never Smoker  . Smokeless tobacco: Never Used  . Tobacco comment: "whole family" smoked in the house  Substance and Sexual Activity  . Alcohol use: No  . Drug use: No  . Sexual activity: Not on file  Other Topics Concern  . Not on file  Social History Narrative  . Not on file   Social Determinants of Health   Financial Resource Strain:   . Difficulty of Paying Living Expenses:   Food Insecurity:   . Worried About Charity fundraiser in the Last Year:   . Arboriculturist in the Last Year:   Transportation Needs:   . Film/video editor (Medical):   Marland Kitchen Lack of Transportation (Non-Medical):   Physical Activity:   . Days of Exercise per Week:   . Minutes of Exercise per Session:   Stress:   . Feeling of Stress :   Social Connections:   . Frequency of  Communication with Friends and Family:   . Frequency of Social Gatherings with Friends and Family:   . Attends Religious Services:   . Active Member of Clubs or Organizations:   . Attends Archivist Meetings:   Marland Kitchen Marital Status:      Family History: The patient's family history includes Breast cancer in her paternal aunt; Diabetes in her mother; Stroke in her father and paternal aunt.  ROS:   Please see the history of present illness.    All other systems reviewed and are negative.  EKGs/Labs/Other Studies Reviewed:    The following studies were reviewed today: EKG reveals sinus rhythm bradycardia nonspecific ST-T changes   Recent Labs: 01/05/2019: ALT 15 03/27/2019: BUN 14; Creatinine, Ser 0.88; Hemoglobin 13.3; Magnesium 2.0; Platelets 185; Potassium 3.7; Sodium 140  Recent Lipid Panel    Component Value Date/Time   CHOL 168 06/07/2017 0912   TRIG 107 06/07/2017 0912   HDL 60 06/07/2017 0912   CHOLHDL 2.8 06/07/2017 0912   LDLCALC 87 06/07/2017 0912    Physical Exam:    VS:  BP 138/70   Pulse (!) 54   Ht 5\' 2"  (1.575 m)   Wt 224 lb (101.6 kg)   SpO2 98%   BMI 40.97 kg/m     Wt Readings from Last 3 Encounters:  08/07/19 224 lb (101.6 kg)  05/04/19 216 lb 12.8 oz (98.3 kg)  04/03/19 213 lb (96.6 kg)     GEN: Patient is in no acute distress HEENT: Normal NECK: No JVD; No carotid bruits LYMPHATICS: No lymphadenopathy CARDIAC: Hear sounds regular, 2/6 systolic murmur at the apex. RESPIRATORY:  Clear to auscultation without rales, wheezing or rhonchi  ABDOMEN: Soft, non-tender, non-distended MUSCULOSKELETAL:  No edema; No deformity  SKIN: Warm and dry NEUROLOGIC:  Alert and oriented x 3 PSYCHIATRIC:  Normal affect   Signed, Jenean Lindau, MD  08/07/2019 11:53 AM    [place]

## 2019-08-08 DIAGNOSIS — Z1159 Encounter for screening for other viral diseases: Secondary | ICD-10-CM | POA: Diagnosis not present

## 2019-08-08 DIAGNOSIS — Z1152 Encounter for screening for COVID-19: Secondary | ICD-10-CM | POA: Diagnosis not present

## 2019-08-14 DIAGNOSIS — R0683 Snoring: Secondary | ICD-10-CM | POA: Diagnosis not present

## 2019-08-14 DIAGNOSIS — E119 Type 2 diabetes mellitus without complications: Secondary | ICD-10-CM | POA: Diagnosis not present

## 2019-08-14 DIAGNOSIS — D241 Benign neoplasm of right breast: Secondary | ICD-10-CM | POA: Diagnosis not present

## 2019-08-14 DIAGNOSIS — F411 Generalized anxiety disorder: Secondary | ICD-10-CM | POA: Diagnosis not present

## 2019-08-14 DIAGNOSIS — L409 Psoriasis, unspecified: Secondary | ICD-10-CM | POA: Diagnosis not present

## 2019-08-14 DIAGNOSIS — I482 Chronic atrial fibrillation, unspecified: Secondary | ICD-10-CM | POA: Diagnosis not present

## 2019-08-14 DIAGNOSIS — E039 Hypothyroidism, unspecified: Secondary | ICD-10-CM | POA: Diagnosis not present

## 2019-08-14 DIAGNOSIS — Z6841 Body Mass Index (BMI) 40.0 and over, adult: Secondary | ICD-10-CM | POA: Diagnosis not present

## 2019-08-14 DIAGNOSIS — D0511 Intraductal carcinoma in situ of right breast: Secondary | ICD-10-CM | POA: Diagnosis not present

## 2019-08-14 DIAGNOSIS — I10 Essential (primary) hypertension: Secondary | ICD-10-CM | POA: Diagnosis not present

## 2019-08-14 DIAGNOSIS — Z79899 Other long term (current) drug therapy: Secondary | ICD-10-CM | POA: Diagnosis not present

## 2019-08-14 DIAGNOSIS — Z7901 Long term (current) use of anticoagulants: Secondary | ICD-10-CM | POA: Diagnosis not present

## 2019-08-14 DIAGNOSIS — E785 Hyperlipidemia, unspecified: Secondary | ICD-10-CM | POA: Diagnosis not present

## 2019-08-22 DIAGNOSIS — D0511 Intraductal carcinoma in situ of right breast: Secondary | ICD-10-CM | POA: Diagnosis not present

## 2019-08-28 DIAGNOSIS — E876 Hypokalemia: Secondary | ICD-10-CM | POA: Diagnosis not present

## 2019-08-28 DIAGNOSIS — M8589 Other specified disorders of bone density and structure, multiple sites: Secondary | ICD-10-CM | POA: Diagnosis not present

## 2019-08-28 DIAGNOSIS — E119 Type 2 diabetes mellitus without complications: Secondary | ICD-10-CM | POA: Diagnosis not present

## 2019-08-28 DIAGNOSIS — D241 Benign neoplasm of right breast: Secondary | ICD-10-CM | POA: Diagnosis not present

## 2019-08-28 DIAGNOSIS — R945 Abnormal results of liver function studies: Secondary | ICD-10-CM | POA: Diagnosis not present

## 2019-08-28 DIAGNOSIS — D0511 Intraductal carcinoma in situ of right breast: Secondary | ICD-10-CM | POA: Diagnosis not present

## 2019-08-28 DIAGNOSIS — L409 Psoriasis, unspecified: Secondary | ICD-10-CM | POA: Diagnosis not present

## 2019-08-30 DIAGNOSIS — Z803 Family history of malignant neoplasm of breast: Secondary | ICD-10-CM | POA: Diagnosis not present

## 2019-08-30 DIAGNOSIS — D0511 Intraductal carcinoma in situ of right breast: Secondary | ICD-10-CM | POA: Diagnosis not present

## 2019-08-31 DIAGNOSIS — I480 Paroxysmal atrial fibrillation: Secondary | ICD-10-CM | POA: Diagnosis not present

## 2019-08-31 DIAGNOSIS — Z6841 Body Mass Index (BMI) 40.0 and over, adult: Secondary | ICD-10-CM | POA: Diagnosis not present

## 2019-08-31 DIAGNOSIS — E785 Hyperlipidemia, unspecified: Secondary | ICD-10-CM | POA: Diagnosis not present

## 2019-08-31 DIAGNOSIS — Z79899 Other long term (current) drug therapy: Secondary | ICD-10-CM | POA: Diagnosis not present

## 2019-08-31 DIAGNOSIS — I10 Essential (primary) hypertension: Secondary | ICD-10-CM | POA: Diagnosis not present

## 2019-08-31 DIAGNOSIS — E1169 Type 2 diabetes mellitus with other specified complication: Secondary | ICD-10-CM | POA: Diagnosis not present

## 2019-08-31 DIAGNOSIS — F411 Generalized anxiety disorder: Secondary | ICD-10-CM | POA: Diagnosis not present

## 2019-08-31 DIAGNOSIS — E039 Hypothyroidism, unspecified: Secondary | ICD-10-CM | POA: Diagnosis not present

## 2019-09-01 DIAGNOSIS — D0511 Intraductal carcinoma in situ of right breast: Secondary | ICD-10-CM | POA: Diagnosis not present

## 2019-09-04 DIAGNOSIS — D0511 Intraductal carcinoma in situ of right breast: Secondary | ICD-10-CM | POA: Diagnosis not present

## 2019-09-04 DIAGNOSIS — Z510 Encounter for antineoplastic radiation therapy: Secondary | ICD-10-CM | POA: Diagnosis not present

## 2019-09-05 DIAGNOSIS — Z510 Encounter for antineoplastic radiation therapy: Secondary | ICD-10-CM | POA: Diagnosis not present

## 2019-09-05 DIAGNOSIS — D0511 Intraductal carcinoma in situ of right breast: Secondary | ICD-10-CM | POA: Diagnosis not present

## 2019-09-12 DIAGNOSIS — D0511 Intraductal carcinoma in situ of right breast: Secondary | ICD-10-CM | POA: Diagnosis not present

## 2019-09-18 DIAGNOSIS — Z510 Encounter for antineoplastic radiation therapy: Secondary | ICD-10-CM | POA: Diagnosis not present

## 2019-09-18 DIAGNOSIS — D0511 Intraductal carcinoma in situ of right breast: Secondary | ICD-10-CM | POA: Diagnosis not present

## 2019-09-19 DIAGNOSIS — D0511 Intraductal carcinoma in situ of right breast: Secondary | ICD-10-CM | POA: Diagnosis not present

## 2019-09-21 DIAGNOSIS — L400 Psoriasis vulgaris: Secondary | ICD-10-CM | POA: Diagnosis not present

## 2019-09-26 DIAGNOSIS — D0511 Intraductal carcinoma in situ of right breast: Secondary | ICD-10-CM | POA: Diagnosis not present

## 2019-09-29 DIAGNOSIS — D0511 Intraductal carcinoma in situ of right breast: Secondary | ICD-10-CM | POA: Diagnosis not present

## 2019-10-03 DIAGNOSIS — D0511 Intraductal carcinoma in situ of right breast: Secondary | ICD-10-CM | POA: Diagnosis not present

## 2019-10-09 ENCOUNTER — Ambulatory Visit (INDEPENDENT_AMBULATORY_CARE_PROVIDER_SITE_OTHER): Payer: PPO | Admitting: Cardiology

## 2019-10-09 ENCOUNTER — Encounter: Payer: Self-pay | Admitting: Cardiology

## 2019-10-09 ENCOUNTER — Other Ambulatory Visit: Payer: Self-pay

## 2019-10-09 VITALS — BP 136/66 | HR 58 | Ht 61.0 in | Wt 218.4 lb

## 2019-10-09 DIAGNOSIS — E785 Hyperlipidemia, unspecified: Secondary | ICD-10-CM | POA: Diagnosis not present

## 2019-10-09 DIAGNOSIS — I480 Paroxysmal atrial fibrillation: Secondary | ICD-10-CM | POA: Diagnosis not present

## 2019-10-09 DIAGNOSIS — I2510 Atherosclerotic heart disease of native coronary artery without angina pectoris: Secondary | ICD-10-CM | POA: Diagnosis not present

## 2019-10-09 DIAGNOSIS — E119 Type 2 diabetes mellitus without complications: Secondary | ICD-10-CM

## 2019-10-09 DIAGNOSIS — E088 Diabetes mellitus due to underlying condition with unspecified complications: Secondary | ICD-10-CM | POA: Diagnosis not present

## 2019-10-09 DIAGNOSIS — I7090 Unspecified atherosclerosis: Secondary | ICD-10-CM

## 2019-10-09 MED ORDER — ATORVASTATIN CALCIUM 80 MG PO TABS: 80.0000 mg | ORAL_TABLET | Freq: Every day | ORAL | 3 refills | Status: DC

## 2019-10-09 NOTE — Progress Notes (Signed)
Cardiology Office Note:    Date:  10/09/2019   ID:  Crystal Ramos, DOB 04/11/1956, MRN [id number]  PCP:  Nicoletta Dress, MD  Cardiologist:  Jenean Lindau, MD   Referring MD: Nicoletta Dress, MD    ASSESSMENT:    1. Atherosclerotic vascular disease   2. Coronary artery disease involving native coronary artery of native heart without angina pectoris   3. Paroxysmal atrial fibrillation (HCC)   4. Diabetes mellitus due to underlying condition with unspecified complications ([place])   5. Morbid obesity ([place])   6. Type 2 diabetes mellitus without complication, without long-term current use of insulin (HCC)    PLAN:    In order of problems listed above:  1. Coronary artery disease: I discussed my findings with the patient at extensive length and secondary prevention stressed.  Importance of compliance with diet medication stressed and she vocalized understanding. 2. Essential hypertension: Blood pressure stable and diet was emphasized. 3. Mixed dyslipidemia: Lipids are markedly elevated and I reviewed this from the [facility] sheet.  I increased atorvastatin to 80 mg daily and she will have liver lipid check in 6 weeks. 4. Diabetes mellitus: And obesity: Hemoglobin A1c was also markedly elevated.  I had an extensive discussion with the patient about diet exercise and losing weight and this of obesity explained and she promised to do better. 5. Patient will be seen in follow-up appointment in 4 months or earlier if the patient has any concerns    Medication Adjustments/Labs and Tests Ordered: Current medicines are reviewed at length with the patient today.  Concerns regarding medicines are outlined above.  No orders of the defined types were placed in this encounter.  No orders of the defined types were placed in this encounter.    No chief complaint on file.    History of Present Illness:    Crystal Ramos is a 64 y.o. female.  Patient has past medical history of  nonobstructive coronary artery disease, essential hypertension dyslipidemia diabetes mellitus morbid obesity and paroxysmal fibrillation.  She denies any problems at this time and takes care of activities of daily living.  No chest pain orthopnea palpitations PND or any other problems.  She leads a sedentary lifestyle.  At the time of my evaluation, the patient is alert awake oriented and in no distress.  Past Medical History:  Diagnosis Date  . Acute diastolic heart failure ([place])   . Angina pectoris ([place]) 03/27/2019  . 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 Atrial fibrillation ([place])   . Bilateral carotid bruits 01/05/2019  . Chest pain   . Chronic diastolic heart failure ([place]) 03/08/2019  . Coronary artery disease involving native coronary artery of native heart without angina pectoris   . Diabetes mellitus due to underlying condition with unspecified complications ([place]) [id number]  . Dyslipidemia 02/25/2017  . Dyspnea on exertion 12/18/2015  . Essential hypertension   . Hematuria 11/29/2017  . Morbid obesity ([place]) 02/25/2017  . Paroxysmal atrial fibrillation ([place]) 02/25/2017  . Type 2 diabetes mellitus without complication ([place]) [id number]    Past Surgical History:  Procedure Laterality Date  . CHOLECYSTECTOMY    . LEFT HEART CATH AND CORONARY ANGIOGRAPHY N/A 04/03/2019   Procedure: LEFT HEART CATH AND CORONARY ANGIOGRAPHY;  Surgeon: Burnell Blanks, MD;  Location: [facility];  Service: Cardiovascular;  Laterality: N/A;  . MEDIAL PARTIAL KNEE REPLACEMENT      Current Medications: Current Meds  Medication Sig  . ALPRAZolam Duanne Moron)  0.25 MG tablet Take 0.25 mg by mouth at bedtime.   Marland Kitchen aspirin EC 81 MG tablet Take 81 mg by mouth daily.  Marland Kitchen atorvastatin (LIPITOR) 40 MG tablet Take 40 mg by mouth daily.  . canagliflozin (INVOKANA) 300 MG TABS tablet Take 300 mg by mouth daily before breakfast.  . EUTHYROX 88 MCG tablet Take 88 mcg by mouth daily.  . furosemide (LASIX)  40 MG tablet Take 1 tablet (40 mg total) by mouth 2 (two) times daily.  Marland Kitchen levothyroxine (SYNTHROID, LEVOTHROID) 50 MCG tablet Take 50 mcg by mouth daily before breakfast.   . meclizine (ANTIVERT) 25 MG tablet Take 25 mg by mouth 3 (three) times daily.   . metFORMIN (GLUCOPHAGE) 850 MG tablet Take 850 mg by mouth 3 (three) times daily.  . metoprolol succinate (TOPROL-XL) 25 MG 24 hr tablet Take 1 tablet by mouth once daily  . nitroGLYCERIN (NITROSTAT) 0.4 MG SL tablet Place 1 tablet (0.4 mg total) under the tongue every 5 (five) minutes as needed.  Marland Kitchen PACERONE 200 MG tablet Take 0.5 tablets (100 mg total) by mouth daily.  . rivaroxaban (XARELTO) 20 MG TABS tablet Take 20 mg by mouth daily with supper.     Allergies:   Patient has no known allergies.   Social History   Socioeconomic History  . Marital status: Married    Spouse name: Not on file  . Number of children: Not on file  . Years of education: Not on file  . Highest education level: Not on file  Occupational History  . Not on file  Tobacco Use  . Smoking status: Passive Smoke Exposure - Never Smoker  . Smokeless tobacco: Never Used  . Tobacco comment: "whole family" smoked in the house  Substance and Sexual Activity  . Alcohol use: No  . Drug use: No  . Sexual activity: Not on file  Other Topics Concern  . Not on file  Social History Narrative  . Not on file   Social Determinants of Health   Financial Resource Strain:   . Difficulty of Paying Living Expenses:   Food Insecurity:   . Worried About Charity fundraiser in the Last Year:   . Arboriculturist in the Last Year:   Transportation Needs:   . Film/video editor (Medical):   Marland Kitchen Lack of Transportation (Non-Medical):   Physical Activity:   . Days of Exercise per Week:   . Minutes of Exercise per Session:   Stress:   . Feeling of Stress :   Social Connections:   . Frequency of Communication with Friends and Family:   . Frequency of Social Gatherings with  Friends and Family:   . Attends Religious Services:   . Active Member of Clubs or Organizations:   . Attends Archivist Meetings:   Marland Kitchen Marital Status:      Family History: The patient's family history includes Breast cancer in her paternal aunt; Diabetes in her mother; Stroke in her father and paternal aunt.  ROS:   Please see the history of present illness.    All other systems reviewed and are negative.  EKGs/Labs/Other Studies Reviewed:    The following studies were reviewed today: Burnell Blanks, MD (Primary)    Procedures  LEFT HEART CATH AND CORONARY ANGIOGRAPHY  Conclusion    RV Branch lesion is 95% stenosed.  Ost Cx to Prox Cx lesion is 30% stenosed.  Ramus lesion is 30% stenosed.  Mid LAD-1 lesion is  30% stenosed.  Mid LAD-2 lesion is 50% stenosed.  Mid LAD to Dist LAD lesion is 30% stenosed.   1. Mild to moderate non-obstructive disease in the mid LAD 2. Mild non-obstructive disease in the intermediate branch and the Circumflex 3. The RCA is a large dominant vessel with no obstructive in the major segment or distal branches. The small RV marginal branch has a 95% ostial stenosis (too small for PCI).  4. Elevated LVEDP  Recommendations: Medical management of CAD. I would increase her Lasix dosage to 40 mg po BID. She was given Lasix 40 mg IV x 1 in the cath lab.         Recent Labs: 01/05/2019: ALT 15 03/27/2019: BUN 14; Creatinine, Ser 0.88; Hemoglobin 13.3; Magnesium 2.0; Platelets 185; Potassium 3.7; Sodium 140  Recent Lipid Panel    Component Value Date/Time   CHOL 168 06/07/2017 0912   TRIG 107 06/07/2017 0912   HDL 60 06/07/2017 0912   CHOLHDL 2.8 06/07/2017 0912   LDLCALC 87 06/07/2017 0912    Physical Exam:    VS:  BP 136/66   Pulse (!) 58   Ht 5\' 1"  (1.549 m)   Wt 218 lb 6.4 oz (99.1 kg)   SpO2 97%   BMI 41.27 kg/m     Wt Readings from Last 3 Encounters:  10/09/19 218 lb 6.4 oz (99.1 kg)  08/07/19 224 lb  (101.6 kg)  05/04/19 216 lb 12.8 oz (98.3 kg)     GEN: Patient is in no acute distress HEENT: Normal NECK: No JVD; No carotid bruits LYMPHATICS: No lymphadenopathy CARDIAC: Hear sounds regular, 2/6 systolic murmur at the apex. RESPIRATORY:  Clear to auscultation without rales, wheezing or rhonchi  ABDOMEN: Soft, non-tender, non-distended MUSCULOSKELETAL:  No edema; No deformity  SKIN: Warm and dry NEUROLOGIC:  Alert and oriented x 3 PSYCHIATRIC:  Normal affect   Signed, Jenean Lindau, MD  10/09/2019 11:21 AM    [place]

## 2019-10-09 NOTE — Patient Instructions (Addendum)
Medication Instructions:  Your physician has recommended you make the following change in your medication:   Increase your Atorvastatin to 80 mg daily.  *If you need a refill on your cardiac medications before your next appointment, please call your pharmacy*   Lab Work: Your physician recommends that you return for lab work in: 11/21/19  You need to have labs done when you are fasting.  You can come Monday through Friday 8:30 am to 12:00 pm and 1:15 to 4:30. You do not need to make an appointment as the order has already been placed. The labs you are going to have done are LFT and Lipids.  If you have labs (blood work) drawn today and your tests are completely normal, you will receive your results only by: Marland Kitchen MyChart Message (if you have MyChart) OR . A paper copy in the mail If you have any lab test that is abnormal or we need to change your treatment, we will call you to review the results.   Testing/Procedures: None ordered   Follow-Up: At [facility], you and your health needs are our priority.  As part of our continuing mission to provide you with exceptional heart care, we have created designated Provider Care Teams.  These Care Teams include your primary Cardiologist (physician) and Advanced Practice Providers (APPs -  Physician Assistants and Nurse Practitioners) who all work together to provide you with the care you need, when you need it.  We recommend signing up for the patient portal called "MyChart".  Sign up information is provided on this After Visit Summary.  MyChart is used to connect with patients for Virtual Visits (Telemedicine).  Patients are able to view lab/test results, encounter notes, upcoming appointments, etc.  Non-urgent messages can be sent to your provider as well.   To learn more about what you can do with MyChart, go to [email or web address].    Your next appointment:   4 month(s)  The format for your next appointment:   In Person  Provider:    Jyl Heinz, MD   Other Instructions NA

## 2019-10-11 DIAGNOSIS — D0511 Intraductal carcinoma in situ of right breast: Secondary | ICD-10-CM | POA: Diagnosis not present

## 2019-10-17 DIAGNOSIS — D0511 Intraductal carcinoma in situ of right breast: Secondary | ICD-10-CM | POA: Diagnosis not present

## 2019-10-17 DIAGNOSIS — D241 Benign neoplasm of right breast: Secondary | ICD-10-CM | POA: Diagnosis not present

## 2019-10-17 DIAGNOSIS — Z510 Encounter for antineoplastic radiation therapy: Secondary | ICD-10-CM | POA: Diagnosis not present

## 2019-10-18 DIAGNOSIS — D0511 Intraductal carcinoma in situ of right breast: Secondary | ICD-10-CM | POA: Diagnosis not present

## 2019-10-18 DIAGNOSIS — Z510 Encounter for antineoplastic radiation therapy: Secondary | ICD-10-CM | POA: Diagnosis not present

## 2019-10-19 DIAGNOSIS — Z510 Encounter for antineoplastic radiation therapy: Secondary | ICD-10-CM | POA: Diagnosis not present

## 2019-10-19 DIAGNOSIS — D0511 Intraductal carcinoma in situ of right breast: Secondary | ICD-10-CM | POA: Diagnosis not present

## 2019-11-03 ENCOUNTER — Telehealth: Payer: Self-pay

## 2019-11-03 MED ORDER — METOPROLOL SUCCINATE ER 25 MG PO TB24: 25.0000 mg | ORAL_TABLET | Freq: Every day | ORAL | 2 refills | Status: DC

## 2019-11-03 NOTE — Telephone Encounter (Signed)
Refill sent for Metoprolol Succinate into [facility].

## 2019-11-06 ENCOUNTER — Other Ambulatory Visit: Payer: Self-pay | Admitting: Cardiology

## 2019-11-21 DIAGNOSIS — E119 Type 2 diabetes mellitus without complications: Secondary | ICD-10-CM | POA: Diagnosis not present

## 2019-11-21 DIAGNOSIS — D241 Benign neoplasm of right breast: Secondary | ICD-10-CM | POA: Diagnosis not present

## 2019-11-21 DIAGNOSIS — L409 Psoriasis, unspecified: Secondary | ICD-10-CM | POA: Diagnosis not present

## 2019-11-21 DIAGNOSIS — D0511 Intraductal carcinoma in situ of right breast: Secondary | ICD-10-CM | POA: Diagnosis not present

## 2019-11-21 DIAGNOSIS — D696 Thrombocytopenia, unspecified: Secondary | ICD-10-CM | POA: Diagnosis not present

## 2019-11-24 DIAGNOSIS — M8589 Other specified disorders of bone density and structure, multiple sites: Secondary | ICD-10-CM | POA: Diagnosis not present

## 2019-11-30 DIAGNOSIS — E1169 Type 2 diabetes mellitus with other specified complication: Secondary | ICD-10-CM | POA: Diagnosis not present

## 2019-11-30 DIAGNOSIS — Z79899 Other long term (current) drug therapy: Secondary | ICD-10-CM | POA: Diagnosis not present

## 2019-11-30 DIAGNOSIS — I10 Essential (primary) hypertension: Secondary | ICD-10-CM | POA: Diagnosis not present

## 2019-11-30 DIAGNOSIS — I480 Paroxysmal atrial fibrillation: Secondary | ICD-10-CM | POA: Diagnosis not present

## 2019-11-30 DIAGNOSIS — F411 Generalized anxiety disorder: Secondary | ICD-10-CM | POA: Diagnosis not present

## 2019-11-30 DIAGNOSIS — Z1331 Encounter for screening for depression: Secondary | ICD-10-CM | POA: Diagnosis not present

## 2019-11-30 DIAGNOSIS — E785 Hyperlipidemia, unspecified: Secondary | ICD-10-CM | POA: Diagnosis not present

## 2019-11-30 DIAGNOSIS — Z9181 History of falling: Secondary | ICD-10-CM | POA: Diagnosis not present

## 2019-11-30 DIAGNOSIS — Z139 Encounter for screening, unspecified: Secondary | ICD-10-CM | POA: Diagnosis not present

## 2019-12-01 DIAGNOSIS — Z780 Asymptomatic menopausal state: Secondary | ICD-10-CM | POA: Insufficient documentation

## 2019-12-01 DIAGNOSIS — M8580 Other specified disorders of bone density and structure, unspecified site: Secondary | ICD-10-CM

## 2019-12-01 HISTORY — DX: Asymptomatic menopausal state: Z78.0

## 2019-12-01 HISTORY — DX: Other specified disorders of bone density and structure, unspecified site: M85.80

## 2019-12-08 DIAGNOSIS — D0511 Intraductal carcinoma in situ of right breast: Secondary | ICD-10-CM | POA: Diagnosis not present

## 2020-01-11 DIAGNOSIS — S39012A Strain of muscle, fascia and tendon of lower back, initial encounter: Secondary | ICD-10-CM | POA: Diagnosis not present

## 2020-01-15 DIAGNOSIS — E119 Type 2 diabetes mellitus without complications: Secondary | ICD-10-CM | POA: Diagnosis not present

## 2020-01-29 ENCOUNTER — Other Ambulatory Visit: Payer: Self-pay | Admitting: Cardiology

## 2020-01-30 ENCOUNTER — Other Ambulatory Visit: Payer: Self-pay | Admitting: Cardiology

## 2020-01-31 ENCOUNTER — Other Ambulatory Visit: Payer: Self-pay

## 2020-01-31 ENCOUNTER — Other Ambulatory Visit: Payer: Self-pay | Admitting: Cardiology

## 2020-01-31 DIAGNOSIS — I4891 Unspecified atrial fibrillation: Secondary | ICD-10-CM | POA: Insufficient documentation

## 2020-01-31 DIAGNOSIS — R079 Chest pain, unspecified: Secondary | ICD-10-CM | POA: Insufficient documentation

## 2020-01-31 DIAGNOSIS — I5031 Acute diastolic (congestive) heart failure: Secondary | ICD-10-CM | POA: Insufficient documentation

## 2020-02-01 ENCOUNTER — Ambulatory Visit: Payer: PPO | Admitting: Cardiology

## 2020-02-19 ENCOUNTER — Other Ambulatory Visit: Payer: Self-pay | Admitting: Cardiology

## 2020-02-19 DIAGNOSIS — I10 Essential (primary) hypertension: Secondary | ICD-10-CM

## 2020-02-19 DIAGNOSIS — I480 Paroxysmal atrial fibrillation: Secondary | ICD-10-CM

## 2020-03-07 DIAGNOSIS — D0511 Intraductal carcinoma in situ of right breast: Secondary | ICD-10-CM | POA: Diagnosis not present

## 2020-03-08 DIAGNOSIS — I10 Essential (primary) hypertension: Secondary | ICD-10-CM | POA: Diagnosis not present

## 2020-03-08 DIAGNOSIS — Z79899 Other long term (current) drug therapy: Secondary | ICD-10-CM | POA: Diagnosis not present

## 2020-03-08 DIAGNOSIS — E785 Hyperlipidemia, unspecified: Secondary | ICD-10-CM | POA: Diagnosis not present

## 2020-03-08 DIAGNOSIS — E1169 Type 2 diabetes mellitus with other specified complication: Secondary | ICD-10-CM | POA: Diagnosis not present

## 2020-03-08 DIAGNOSIS — Z6841 Body Mass Index (BMI) 40.0 and over, adult: Secondary | ICD-10-CM | POA: Diagnosis not present

## 2020-03-08 DIAGNOSIS — F411 Generalized anxiety disorder: Secondary | ICD-10-CM | POA: Diagnosis not present

## 2020-03-08 DIAGNOSIS — Z23 Encounter for immunization: Secondary | ICD-10-CM | POA: Diagnosis not present

## 2020-03-08 DIAGNOSIS — I480 Paroxysmal atrial fibrillation: Secondary | ICD-10-CM | POA: Diagnosis not present

## 2020-03-18 ENCOUNTER — Other Ambulatory Visit: Payer: Self-pay

## 2020-03-18 ENCOUNTER — Encounter: Payer: Self-pay | Admitting: Cardiology

## 2020-03-18 ENCOUNTER — Ambulatory Visit: Payer: PPO | Admitting: Cardiology

## 2020-03-18 VITALS — BP 138/68 | HR 57 | Ht 61.0 in | Wt 222.0 lb

## 2020-03-18 DIAGNOSIS — I480 Paroxysmal atrial fibrillation: Secondary | ICD-10-CM | POA: Diagnosis not present

## 2020-03-18 DIAGNOSIS — I10 Essential (primary) hypertension: Secondary | ICD-10-CM | POA: Diagnosis not present

## 2020-03-18 DIAGNOSIS — I2510 Atherosclerotic heart disease of native coronary artery without angina pectoris: Secondary | ICD-10-CM

## 2020-03-18 DIAGNOSIS — R011 Cardiac murmur, unspecified: Secondary | ICD-10-CM | POA: Insufficient documentation

## 2020-03-18 DIAGNOSIS — E088 Diabetes mellitus due to underlying condition with unspecified complications: Secondary | ICD-10-CM

## 2020-03-18 HISTORY — DX: Cardiac murmur, unspecified: R01.1

## 2020-03-18 NOTE — Progress Notes (Signed)
Cardiology Office Note:    Date:  03/18/2020   ID:  Crystal Ramos, DOB [birth year], MRN [id number]  PCP:  Nicoletta Dress, MD  Cardiologist:  Jenean Lindau, MD   Referring MD: Nicoletta Dress, MD    ASSESSMENT:    1. Paroxysmal atrial fibrillation (HCC)   2. Essential hypertension   3. Coronary artery disease involving native coronary artery of native heart without angina pectoris   4. Diabetes mellitus due to underlying condition with unspecified complications ([place])   5. Morbid obesity ([place])    PLAN:    In order of problems listed above:  1. Coronary artery disease: Secondary prevention stressed with the patient.  Importance of compliance with diet medication stressed and she vocalized understanding. 2. Essential hypertension: Blood pressure stable and diet was emphasized 3. Diabetes mellitus and morbid obesity: I had an extensive discussion with her about her hemoglobin A1c diet and exercise stressed lifestyle modification was urged and she promises to do better.  She was advised to walk at least half an hour a day 5 times a week and she promises to do so 4. Paroxysmal atrial fibrillation:I discussed with the patient atrial fibrillation, disease process. Management and therapy including rate and rhythm control, anticoagulation benefits and potential risks were discussed extensively with the patient. Patient had multiple questions which were answered to patient's satisfaction. 5. Mixed dyslipidemia: Lipids are stable.  I reviewed records from primary care physician to have blood. 6. Cardiac murmur in the aortic area could suggest sclerosis or stenosis.  We will schedule an echocardiogram to evaluate this further. 7. Patient will be seen in follow-up appointment in 6 months or earlier if the patient has any concerns    Medication Adjustments/Labs and Tests Ordered: Current medicines are reviewed at length with the patient today.  Concerns regarding medicines are  outlined above.  No orders of the defined types were placed in this encounter.  No orders of the defined types were placed in this encounter.    No chief complaint on file.    History of Present Illness:    Crystal Ramos is a 64 y.o. female.  Patient has past medical history approximately fibrillation, nonobstructive coronary artery disease, essential hypertension dyslipidemia diabetes mellitus and morbid obesity.  She has been lax with diet and exercise and has gained weight.  She denies any chest pain orthopnea or PND.  Her hemoglobin A1c is greater than 8.  At the time of my evaluation, the patient is alert awake oriented and in no distress.  Past Medical History:  Diagnosis Date  . Acute diastolic heart failure ([place])   . Angina pectoris ([place]) 03/27/2019  . 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 Atrial fibrillation ([place])   . Bilateral carotid bruits 01/05/2019  . Chest pain   . Chronic diastolic heart failure ([place]) 03/08/2019  . Coronary artery disease involving native coronary artery of native heart without angina pectoris   . Diabetes mellitus due to underlying condition with unspecified complications ([place]) 40/01/8118  . Dyslipidemia 02/25/2017  . Dyspnea on exertion 12/18/2015  . Essential hypertension   . Hematuria 11/29/2017  . Morbid obesity ([place]) 02/25/2017  . Paroxysmal atrial fibrillation ([place]) 02/25/2017  . Type 2 diabetes mellitus without complication ([place]) 05/22/7827    Past Surgical History:  Procedure Laterality Date  . CHOLECYSTECTOMY    . LEFT HEART CATH AND CORONARY ANGIOGRAPHY N/A 04/03/2019   Procedure: LEFT HEART CATH AND CORONARY ANGIOGRAPHY;  Surgeon: Burnell Blanks,  MD;  Location: [facility];  Service: Cardiovascular;  Laterality: N/A;  . MEDIAL PARTIAL KNEE REPLACEMENT      Current Medications: Current Meds  Medication Sig  . ALPRAZolam (XANAX) 0.25 MG tablet Take 0.25 mg by mouth at bedtime.   Marland Kitchen aspirin EC 81 MG tablet  Take 81 mg by mouth daily.  Marland Kitchen atorvastatin (LIPITOR) 80 MG tablet Take 1 tablet (80 mg total) by mouth daily.  . canagliflozin (INVOKANA) 300 MG TABS tablet Take 300 mg by mouth daily before breakfast.  . EUTHYROX 88 MCG tablet Take 88 mcg by mouth daily.  . furosemide (LASIX) 40 MG tablet Take 1 tablet (40 mg total) by mouth 2 (two) times daily.  Marland Kitchen JARDIANCE 25 MG TABS tablet Take 25 mg by mouth daily.  Marland Kitchen levothyroxine (SYNTHROID, LEVOTHROID) 50 MCG tablet Take 50 mcg by mouth daily before breakfast.   . meclizine (ANTIVERT) 25 MG tablet Take 25 mg by mouth 3 (three) times daily.   . metFORMIN (GLUCOPHAGE) 850 MG tablet Take 850 mg by mouth 3 (three) times daily.  . metoprolol succinate (TOPROL-XL) 50 MG 24 hr tablet Take 100 mg by mouth daily.  . nitroGLYCERIN (NITROSTAT) 0.4 MG SL tablet Place 1 tablet (0.4 mg total) under the tongue every 5 (five) minutes as needed.  Marland Kitchen PACERONE 200 MG tablet Take 1 tablet by mouth once daily  . raloxifene (EVISTA) 60 MG tablet Take 60 mg by mouth daily.  . rivaroxaban (XARELTO) 20 MG TABS tablet Take 20 mg by mouth daily with supper.  Marland Kitchen tiZANidine (ZANAFLEX) 4 MG tablet Take 4 mg by mouth 3 (three) times daily as needed.  . traMADol (ULTRAM) 50 MG tablet Take 50 mg by mouth 4 (four) times daily as needed.     Allergies:   Patient has no known allergies.   Social History   Socioeconomic History  . Marital status: Married    Spouse name: Not on file  . Number of children: Not on file  . Years of education: Not on file  . Highest education level: Not on file  Occupational History  . Not on file  Tobacco Use  . Smoking status: Passive Smoke Exposure - Never Smoker  . Smokeless tobacco: Never Used  . Tobacco comment: "whole family" smoked in the house  Vaping Use  . Vaping Use: Never used  Substance and Sexual Activity  . Alcohol use: No  . Drug use: No  . Sexual activity: Not on file  Other Topics Concern  . Not on file  Social History  Narrative  . Not on file   Social Determinants of Health   Financial Resource Strain:   . Difficulty of Paying Living Expenses: Not on file  Food Insecurity:   . Worried About Charity fundraiser in the Last Year: Not on file  . Ran Out of Food in the Last Year: Not on file  Transportation Needs:   . Lack of Transportation (Medical): Not on file  . Lack of Transportation (Non-Medical): Not on file  Physical Activity:   . Days of Exercise per Week: Not on file  . Minutes of Exercise per Session: Not on file  Stress:   . Feeling of Stress : Not on file  Social Connections:   . Frequency of Communication with Friends and Family: Not on file  . Frequency of Social Gatherings with Friends and Family: Not on file  . Attends Religious Services: Not on file  . Active Member  of Clubs or Organizations: Not on file  . Attends Archivist Meetings: Not on file  . Marital Status: Not on file     Family History: The patient's family history includes Breast cancer in her paternal aunt; Diabetes in her mother; Stroke in her father and paternal aunt.  ROS:   Please see the history of present illness.    All other systems reviewed and are negative.  EKGs/Labs/Other Studies Reviewed:    The following studies were reviewed today: EKG reveals sinus rhythm and nonspecific ST-T changes   Recent Labs: 03/27/2019: BUN 14; Creatinine, Ser 0.88; Hemoglobin 13.3; Magnesium 2.0; Platelets 185; Potassium 3.7; Sodium 140  Recent Lipid Panel    Component Value Date/Time   CHOL 168 06/07/2017 0912   TRIG 107 06/07/2017 0912   HDL 60 06/07/2017 0912   CHOLHDL 2.8 06/07/2017 0912   LDLCALC 87 06/07/2017 0912    Physical Exam:    VS:  BP 138/68   Pulse (!) 57   Ht 5\' 1"  (1.549 m)   Wt 222 lb (100.7 kg)   SpO2 98%   BMI 41.95 kg/m     Wt Readings from Last 3 Encounters:  03/18/20 222 lb (100.7 kg)  10/09/19 218 lb 6.4 oz (99.1 kg)  08/07/19 224 lb (101.6 kg)     GEN: Patient is  in no acute distress HEENT: Normal NECK: No JVD; No carotid bruits LYMPHATICS: No lymphadenopathy CARDIAC: Hear sounds regular, 2/6 systolic murmur at the apex.  2/6 systolic murmur can be heard in the aortic area. RESPIRATORY:  Clear to auscultation without rales, wheezing or rhonchi  ABDOMEN: Soft, non-tender, non-distended MUSCULOSKELETAL:  No edema; No deformity  SKIN: Warm and dry NEUROLOGIC:  Alert and oriented x 3 PSYCHIATRIC:  Normal affect   Signed, Jenean Lindau, MD  03/18/2020 8:23 AM    [place]

## 2020-03-18 NOTE — Patient Instructions (Signed)
Medication Instructions:  No medication changes. *If you need a refill on your cardiac medications before your next appointment, please call your pharmacy*   Lab Work: None ordered If you have labs (blood work) drawn today and your tests are completely normal, you will receive your results only by: . MyChart Message (if you have MyChart) OR . A paper copy in the mail If you have any lab test that is abnormal or we need to change your treatment, we will call you to review the results.   Testing/Procedures: Your physician has requested that you have an echocardiogram. Echocardiography is a painless test that uses sound waves to create images of your heart. It provides your doctor with information about the size and shape of your heart and how well your heart's chambers and valves are working. This procedure takes approximately one hour. There are no restrictions for this procedure.     Follow-Up: At [facility], you and your health needs are our priority.  As part of our continuing mission to provide you with exceptional heart care, we have created designated Provider Care Teams.  These Care Teams include your primary Cardiologist (physician) and Advanced Practice Providers (APPs -  Physician Assistants and Nurse Practitioners) who all work together to provide you with the care you need, when you need it.  We recommend signing up for the patient portal called "MyChart".  Sign up information is provided on this After Visit Summary.  MyChart is used to connect with patients for Virtual Visits (Telemedicine).  Patients are able to view lab/test results, encounter notes, upcoming appointments, etc.  Non-urgent messages can be sent to your provider as well.   To learn more about what you can do with MyChart, go to [email or web address].    Your next appointment:   6 month(s)  The format for your next appointment:   In Person  Provider:   Rajan Revankar, MD   Other  Instructions  Echocardiogram An echocardiogram is a procedure that uses painless sound waves (ultrasound) to produce an image of the heart. Images from an echocardiogram can provide important information about:  Signs of coronary artery disease (CAD).  Aneurysm detection. An aneurysm is a weak or damaged part of an artery wall that bulges out from the normal force of blood pumping through the body.  Heart size and shape. Changes in the size or shape of the heart can be associated with certain conditions, including heart failure, aneurysm, and CAD.  Heart muscle function.  Heart valve function.  Signs of a past heart attack.  Fluid buildup around the heart.  Thickening of the heart muscle.  A tumor or infectious growth around the heart valves. Tell a health care provider about:  Any allergies you have.  All medicines you are taking, including vitamins, herbs, eye drops, creams, and over-the-counter medicines.  Any blood disorders you have.  Any surgeries you have had.  Any medical conditions you have.  Whether you are pregnant or may be pregnant. What are the risks? Generally, this is a safe procedure. However, problems may occur, including:  Allergic reaction to dye (contrast) that may be used during the procedure. What happens before the procedure? No specific preparation is needed. You may eat and drink normally. What happens during the procedure?   An IV tube may be inserted into one of your veins.  You may receive contrast through this tube. A contrast is an injection that improves the quality of the pictures from your heart.  A   gel will be applied to your chest.  A wand-like tool (transducer) will be moved over your chest. The gel will help to transmit the sound waves from the transducer.  The sound waves will harmlessly bounce off of your heart to allow the heart images to be captured in real-time motion. The images will be recorded on a computer. The  procedure may vary among health care providers and hospitals. What happens after the procedure?  You may return to your normal, everyday life, including diet, activities, and medicines, unless your health care provider tells you not to do that. Summary  An echocardiogram is a procedure that uses painless sound waves (ultrasound) to produce an image of the heart.  Images from an echocardiogram can provide important information about the size and shape of your heart, heart muscle function, heart valve function, and fluid buildup around your heart.  You do not need to do anything to prepare before this procedure. You may eat and drink normally.  After the echocardiogram is completed, you may return to your normal, everyday life, unless your health care provider tells you not to do that. This information is not intended to replace advice given to you by your health care provider. Make sure you discuss any questions you have with your health care provider. Document Revised: 08/25/2018 Document Reviewed: 06/06/2016 Elsevier Patient Education  2020 [employer]vier Inc.   

## 2020-03-20 ENCOUNTER — Other Ambulatory Visit: Payer: Self-pay

## 2020-03-20 ENCOUNTER — Ambulatory Visit (INDEPENDENT_AMBULATORY_CARE_PROVIDER_SITE_OTHER): Payer: PPO

## 2020-03-20 DIAGNOSIS — Z9181 History of falling: Secondary | ICD-10-CM | POA: Diagnosis not present

## 2020-03-20 DIAGNOSIS — E669 Obesity, unspecified: Secondary | ICD-10-CM | POA: Diagnosis not present

## 2020-03-20 DIAGNOSIS — E785 Hyperlipidemia, unspecified: Secondary | ICD-10-CM | POA: Diagnosis not present

## 2020-03-20 DIAGNOSIS — Z1331 Encounter for screening for depression: Secondary | ICD-10-CM | POA: Diagnosis not present

## 2020-03-20 DIAGNOSIS — Z0000 Encounter for general adult medical examination without abnormal findings: Secondary | ICD-10-CM | POA: Diagnosis not present

## 2020-03-20 DIAGNOSIS — I480 Paroxysmal atrial fibrillation: Secondary | ICD-10-CM | POA: Diagnosis not present

## 2020-03-20 LAB — ECHOCARDIOGRAM COMPLETE
AR max vel: 1.09 cm2
AV Area VTI: 1.02 cm2
AV Area mean vel: 0.97 cm2
AV Mean grad: 32 mmHg
AV Peak grad: 56.9 mmHg
Ao pk vel: 3.77 m/s
Area-P 1/2: 1.58 cm2
S' Lateral: 3 cm

## 2020-03-20 NOTE — Progress Notes (Signed)
Complete echocardiogram performed.  Jimmy Urania Pearlman RDCS, RVT  

## 2020-04-09 ENCOUNTER — Other Ambulatory Visit: Payer: Self-pay | Admitting: Cardiology

## 2020-05-15 DIAGNOSIS — R922 Inconclusive mammogram: Secondary | ICD-10-CM | POA: Diagnosis not present

## 2020-05-15 DIAGNOSIS — D0511 Intraductal carcinoma in situ of right breast: Secondary | ICD-10-CM | POA: Diagnosis not present

## 2020-05-15 DIAGNOSIS — Z853 Personal history of malignant neoplasm of breast: Secondary | ICD-10-CM | POA: Diagnosis not present

## 2020-06-05 DIAGNOSIS — D0511 Intraductal carcinoma in situ of right breast: Secondary | ICD-10-CM | POA: Diagnosis not present

## 2020-06-08 DIAGNOSIS — M25512 Pain in left shoulder: Secondary | ICD-10-CM | POA: Diagnosis not present

## 2020-06-08 DIAGNOSIS — E039 Hypothyroidism, unspecified: Secondary | ICD-10-CM | POA: Diagnosis not present

## 2020-06-08 DIAGNOSIS — I10 Essential (primary) hypertension: Secondary | ICD-10-CM | POA: Diagnosis not present

## 2020-06-08 DIAGNOSIS — Z79899 Other long term (current) drug therapy: Secondary | ICD-10-CM | POA: Diagnosis not present

## 2020-06-08 DIAGNOSIS — H8113 Benign paroxysmal vertigo, bilateral: Secondary | ICD-10-CM | POA: Diagnosis not present

## 2020-06-08 DIAGNOSIS — E785 Hyperlipidemia, unspecified: Secondary | ICD-10-CM | POA: Diagnosis not present

## 2020-06-08 DIAGNOSIS — E1169 Type 2 diabetes mellitus with other specified complication: Secondary | ICD-10-CM | POA: Diagnosis not present

## 2020-06-08 DIAGNOSIS — I480 Paroxysmal atrial fibrillation: Secondary | ICD-10-CM | POA: Diagnosis not present

## 2020-06-11 DIAGNOSIS — E785 Hyperlipidemia, unspecified: Secondary | ICD-10-CM | POA: Diagnosis not present

## 2020-06-11 DIAGNOSIS — Z79899 Other long term (current) drug therapy: Secondary | ICD-10-CM | POA: Diagnosis not present

## 2020-06-11 DIAGNOSIS — E1169 Type 2 diabetes mellitus with other specified complication: Secondary | ICD-10-CM | POA: Diagnosis not present

## 2020-06-17 DIAGNOSIS — M7502 Adhesive capsulitis of left shoulder: Secondary | ICD-10-CM | POA: Diagnosis not present

## 2020-06-18 ENCOUNTER — Other Ambulatory Visit: Payer: Self-pay | Admitting: Oncology

## 2020-06-19 ENCOUNTER — Telehealth: Payer: Self-pay | Admitting: Oncology

## 2020-06-19 NOTE — Telephone Encounter (Signed)
Per 2/1 Staff Msg, patient scheduled for 2/21 Labs 1:30 pm - Follow Up 2:00 pm

## 2020-07-05 ENCOUNTER — Telehealth: Payer: Self-pay | Admitting: Oncology

## 2020-07-05 NOTE — Telephone Encounter (Signed)
Patient rescheduled to 3/16 due to financial difficulties

## 2020-07-05 NOTE — Progress Notes (Incomplete)
[facility]  [address] [place],  [place]  [address] [phone]  Clinic Day:  07/05/2020  Referring physician: Nicoletta Dress, MD  This document serves as a record of services personally performed by Hosie Poisson, MD. It was created on their behalf by [facility] E, a trained medical scribe. The creation of this record is based on the scribe's personal observations and the provider's statements to them.  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 female with stage 0 right breast cancer diagnosed in March 2021.  This began in December when she went in for annual mammography which revealed suspicious calcifications within the right breast.  Unilateral right mammogram from February 8th confirmed suspicious developing calcifications in the upper inner quadrant of the right breast spanning an area of 1 cm.  Stereotactic biopsy from March 4th confirmed calcifications associated with high grade ductal carcinoma in situ with comedo necrosis.  No invasive carcinoma was identified.  Bilateral breast MRI from March 18th was negative for additional sites of malignancy.  She was then referred to Dr. Lilia Pro and on March 29th underwent right breast lumpectomy and attempted sentinel lymph node biopsy.  Surgical pathology from this procedure confirmed ductal carcinoma in situ, grade 3, measuring 12 mm in extent.  Margins were free of neoplasm.  No invasive carcinoma identified.  Estrogen receptor was 60% positive and progesterone receptor was 70% positive.  She started menarche at age 30, and has had 2 children with her first child at age 52.  She went through menopause at age 80-50, and was not placed on hormones.  She does have psoriasis which is treated with injections through the dermatologist.  She is  here for routine follow up after completing adjuvant radiation with a 60 Gy course to the right breast in early June.  She states that she had moderate skin reaction with her treatment which was painful, and she continues to have mild pain.  Otherwise, she has been well and denies other complaints.  Her appetite is good, and she has lost 1 1/2 pounds since her last visit.  She denies fever or chills.  She denies nausea, vomiting, bowel issues, or abdominal pain.  She denies sore throat, cough, dyspnea, or chest pain.  INTERVAL HISTORY:  Crystal Ramos is here for routine follow up ***.  She started raloxifene daily in July 2021 and has tolerated this without significant difficulty.  Bone density scan from July revealed osteopenia with a T-score of -1.6 of the right femur, previously -1.4.  Dual femur total mean is normal at -1.0, previously -0.4.  AP spine measures -1.1.  Annual mammogram from December 2021 was clear.   Her  appetite is good, and she has gained/lost _ pounds since her last visit.  She denies fever, chills or other signs of infection.  She denies nausea, vomiting, bowel issues, or abdominal pain.  She denies sore throat, cough, dyspnea, or chest pain.  REVIEW OF SYSTEMS:  Review of Systems - Oncology   VITALS:  There were no vitals taken for this visit.  Wt Readings from Last 3 Encounters:  03/18/20 222 lb (100.7 kg)  10/09/19 218 lb 6.4 oz (99.1 kg)  08/07/19 224 lb (101.6 kg)    There is no height or weight on file to  calculate BMI.  Performance status (ECOG): {CHL ONC [id number]  PHYSICAL EXAM:  Physical Exam  LABS:   CBC Latest Ref Rng & Units 03/27/2019 06/07/2017 04/12/2017  WBC 3.4 - 10.8 x10E3/uL 6.1 5.7 7.8  Hemoglobin 11.1 - 15.9 g/dL 13.3 14.3 14.4  Hematocrit 34.0 - 46.6 % 41.9 41.8 43.4  Platelets 150 - 450 x10E3/uL 185 140(L) 178   CMP Latest Ref Rng & Units 03/27/2019 03/08/2019 02/24/2019  Glucose 65 - 99 mg/dL 81 102(H) 158(H)  BUN 8 - 27 mg/dL 14 10 12    Creatinine 0.57 - 1.00 mg/dL 0.88 0.85 0.83  Sodium 134 - 144 mmol/L 140 141 139  Potassium 3.5 - 5.2 mmol/L 3.7 3.5 3.5  Chloride 96 - 106 mmol/L 100 99 101  CO2 20 - 29 mmol/L 26 27 24   Calcium 8.7 - 10.3 mg/dL 8.8 8.7 8.9  Total Protein 6.0 - 8.5 g/dL - - -  Total Bilirubin 0.0 - 1.2 mg/dL - - -  Alkaline Phos 39 - 117 IU/L - - -  AST 0 - 40 IU/L - - -  ALT 0 - 32 IU/L - - -     No results found for: CEA1 / No results found for: CEA1 No results found for: PSA1 No results found for: ASN053 No results found for: ZJQ734  No results found for: TOTALPROTELP, ALBUMINELP, A1GS, A2GS, BETS, BETA2SER, GAMS, MSPIKE, SPEI No results found for: TIBC, FERRITIN, IRONPCTSAT No results found for: LDH   STUDIES:   She underwent a DXA for bone mineral density on 11/24/2019 showing osteopenia with a T-score of -1.6 of the right femur, previously -1.4.  Dual femur total mean is normal at -1.0, previously -0.4.  AP spine measures -1.1.  She underwent digital diagnostic bilateral mammogram with tomography on 05/15/2020 showing: breast density category B.  Expected postsurgical changes in the right breast. No mammographic evidence of malignancy bilaterally.  Allergies: No Known Allergies  Current Medications: Current Outpatient Medications  Medication Sig Dispense Refill  . ALPRAZolam (XANAX) 0.25 MG tablet Take 0.25 mg by mouth at bedtime.     Marland Kitchen amiodarone (PACERONE) 200 MG tablet Take 1 tablet by mouth once daily 90 tablet 3  . aspirin EC 81 MG tablet Take 81 mg by mouth daily.    Marland Kitchen atorvastatin (LIPITOR) 80 MG tablet Take 1 tablet (80 mg total) by mouth daily. 90 tablet 3  . canagliflozin (INVOKANA) 300 MG TABS tablet Take 300 mg by mouth daily before breakfast.    . EUTHYROX 88 MCG tablet Take 88 mcg by mouth daily.    . furosemide (LASIX) 40 MG tablet Take 1 tablet (40 mg total) by mouth 2 (two) times daily. 60 tablet 3  . JARDIANCE 25 MG TABS tablet Take 25 mg by mouth daily.    Marland Kitchen  levothyroxine (SYNTHROID, LEVOTHROID) 50 MCG tablet Take 50 mcg by mouth daily before breakfast.     . meclizine (ANTIVERT) 25 MG tablet Take 25 mg by mouth 3 (three) times daily.     . metFORMIN (GLUCOPHAGE) 850 MG tablet Take 850 mg by mouth 3 (three) times daily.    . metoprolol succinate (TOPROL-XL) 50 MG 24 hr tablet Take 100 mg by mouth daily.    . nitroGLYCERIN (NITROSTAT) 0.4 MG SL tablet Place 1 tablet (0.4 mg total) under the tongue every 5 (five) minutes as needed. 11 tablet 6  . raloxifene (EVISTA) 60 MG tablet Take 1 tablet by mouth once daily 90 tablet 0  . rivaroxaban (  XARELTO) 20 MG TABS tablet Take 20 mg by mouth daily with supper.    Marland Kitchen tiZANidine (ZANAFLEX) 4 MG tablet Take 4 mg by mouth 3 (three) times daily as needed.    . traMADol (ULTRAM) 50 MG tablet Take 50 mg by mouth 4 (four) times daily as needed.     No current facility-administered medications for this visit.     ASSESSMENT & PLAN:   Assessment:   1.  Stage 0 ductal carcinoma in situ diagnosed in March 2021, treated with lumpectomy.  Estrogen and progesterone receptors were highly positive.  She completed adjuvant radiation in early June 2021.  She started Raloxifene 60 mg daily in July, and has tolerated this without difficulty.     2.  Psoriasis.  She was previously on injections every 3 months.  3.  Diabetes.  4.  Mild thrombocytopenia.  5.  Mild elevation of the liver transaminases.  She notes that Dr. Delena Bali has been monitoring this.  It may be due to medication and/or her diabetes.  6.  Mild osteopenia.  Her last bone density was in December 2017, so I will schedule her for repeat imaging.  7.  Low normal potassium.  I have given her a list of potassium rich foods to increase this through her diet.  83. Strong family history with 2 members on the paternal side with breast cancer and she makes a 56rd.  She has been seen by [facility] to discuss genetic testing.     Plan: She started raloxifene 60 mg daily  in July 2021, and has tolerated this without difficulty.   We will plan to see her back in 3 months for examination.  At a later time she does need to have a 1st colonoscopy.  She understands and agrees to this plan of care.  I have answered her questions and she knows to call with any concerns regarding her disease.    I provided *** minutes (8:52 AM - 8:52 AM) of face-to-face time during this this encounter and > 50% was spent counseling as documented under my assessment and plan.    Derwood Kaplan, MD [facility] AT [facility] [address] [place] [address] Dept: [phone] Dept Fax: [phone]   I, Rita Ohara, am acting as scribe for Derwood Kaplan, MD  I have reviewed this report as typed by the medical scribe, and it is complete and accurate.  Hermina Barters

## 2020-07-08 ENCOUNTER — Inpatient Hospital Stay: Payer: PPO

## 2020-07-08 ENCOUNTER — Inpatient Hospital Stay: Payer: PPO | Admitting: Oncology

## 2020-07-29 NOTE — Progress Notes (Signed)
[facility]  [address] [place],  [place]  [address] [phone]  Clinic Day:  07/31/2020  Referring physician: Nicoletta Dress, MD  This document serves as a record of services personally performed by Hosie Poisson, MD. It was created on their behalf by [facility] E, a trained medical scribe. The creation of this record is based on the scribe's personal observations and the provider's statements to them.  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 female with stage 0 right breast cancer diagnosed in March 2021.  This began in December when she went in for annual mammography which revealed suspicious calcifications within the right breast.  Unilateral right mammogram from February 8th confirmed suspicious developing calcifications in the upper inner quadrant of the right breast spanning an area of 1 cm.  Stereotactic biopsy from March 4th confirmed calcifications associated with high grade ductal carcinoma in situ with comedo necrosis.  No invasive carcinoma was identified.  Bilateral breast MRI from March 18th was negative for additional sites of malignancy.  She was then referred to Dr. Lilia Pro and on March 29th underwent right breast lumpectomy and attempted sentinel lymph node biopsy.  Surgical pathology from this procedure confirmed ductal carcinoma in situ, grade 3, measuring 12 mm in extent.  Margins were free of neoplasm.  No invasive carcinoma identified.  Estrogen receptor was 60% positive and progesterone receptor was 70% positive.  She started menarche at age 70, and has had 2 children with her first child at age 30.  She went through menopause at age 64-50, and was not placed on hormones.  She does have psoriasis which is treated with injections through the  dermatologist.  She completed adjuvant radiation with a 60 Gy course to the right breast in early June.  INTERVAL HISTORY:  Crystal Ramos is here for routine follow up and states that she has been well.  She was placed on chemoprevention with raloxifene in July 2021, and states that she has tolerated this without difficulty, but ran out 2-3 weeks ago.  I will refill this today.  Annual mammogram from December 2021 was clear.  Bone density from July 2021 revealed osteopenia with a T-score of -1.6 of the right femur neck, previously -1.4.  Dual femur total mean is normal at -1.0, previously -0.4.  AP spine measures -1.1.  Her  appetite is good, but she has lost 9 pounds since her last visit.  She denies fever, chills or other signs of infection.  She denies nausea, vomiting, bowel issues, or abdominal pain.  She denies sore throat, cough, dyspnea, or chest pain.  REVIEW OF SYSTEMS:  Review of Systems  Constitutional: Negative.  Negative for appetite change, chills, fatigue, fever and unexpected weight change.  HENT:  Negative.   Eyes: Negative.   Respiratory: Negative.  Negative for chest tightness, cough, hemoptysis, shortness of breath and wheezing.   Cardiovascular: Negative.  Negative for chest pain, leg swelling and palpitations.  Gastrointestinal: Negative.  Negative for abdominal distention, abdominal pain, blood in stool, constipation, diarrhea, nausea and vomiting.  Endocrine: Negative.   Genitourinary: Negative.  Negative for difficulty urinating, dysuria, frequency and hematuria.   Musculoskeletal: Negative.  Negative for arthralgias, back pain, flank pain, gait problem and myalgias.  Skin: Negative.   Neurological: Negative.  Negative for dizziness, extremity weakness, gait problem, headaches, light-headedness, numbness, seizures and speech difficulty.  Hematological: Negative.   Psychiatric/Behavioral: Negative.  Negative for depression and sleep disturbance. The patient is not  nervous/anxious.      VITALS:  Blood pressure 140/64, pulse 60, temperature 98.3 F (36.8 C), temperature source Oral, resp. rate 18, height 5\' 1"  (1.549 m), weight 213 lb 3.2 oz (96.7 kg), SpO2 95 %.  Wt Readings from Last 3 Encounters:  07/31/20 213 lb 3.2 oz (96.7 kg)  03/18/20 222 lb (100.7 kg)  10/09/19 218 lb 6.4 oz (99.1 kg)    Body mass index is 40.28 kg/m.  Performance status (ECOG): 0 - Asymptomatic  PHYSICAL EXAM:  Physical Exam Constitutional:      General: She is not in acute distress.    Appearance: Normal appearance. She is normal weight.  HENT:     Head: Normocephalic and atraumatic.  Eyes:     General: No scleral icterus.    Extraocular Movements: Extraocular movements intact.     Conjunctiva/sclera: Conjunctivae normal.     Pupils: Pupils are equal, round, and reactive to light.  Cardiovascular:     Rate and Rhythm: Normal rate and regular rhythm.     Pulses: Normal pulses.     Heart sounds: Normal heart sounds. No murmur heard. No friction rub. No gallop.   Pulmonary:     Effort: Pulmonary effort is normal. No respiratory distress.     Breath sounds: Normal breath sounds.  Chest:  Breasts:     Right: Normal.      Comments: Well healed right axillary scar.  She has a fading scar in the upper right breast which is well healed.  Both breasts are without masses. Abdominal:     General: Bowel sounds are normal. There is no distension.     Palpations: Abdomen is soft. There is no hepatomegaly, splenomegaly or mass.     Tenderness: There is no abdominal tenderness.  Musculoskeletal:        General: Normal range of motion.     Cervical back: Normal range of motion and neck supple.     Right lower leg: No edema.     Left lower leg: No edema.  Lymphadenopathy:     Cervical: No cervical adenopathy.  Skin:    General: Skin is warm and dry.  Neurological:     General: No focal deficit present.     Mental Status: She is alert and oriented to person, place,  and time. Mental status is at baseline.  Psychiatric:        Mood and Affect: Mood normal.        Behavior: Behavior normal.        Thought Content: Thought content normal.        Judgment: Judgment normal.     LABS:   CBC Latest Ref Rng & Units 03/27/2019 06/07/2017 04/12/2017  WBC 3.4 - 10.8 x10E3/uL 6.1 5.7 7.8  Hemoglobin 11.1 - 15.9 g/dL 13.3 14.3 14.4  Hematocrit 34.0 - 46.6 % 41.9 41.8 43.4  Platelets 150 - 450 x10E3/uL 185 140(L) 178   CMP Latest Ref Rng & Units 03/27/2019 03/08/2019 02/24/2019  Glucose 65 - 99 mg/dL 81 102(H) 158(H)  BUN 8 - 27 mg/dL 14 10 12   Creatinine 0.57 - 1.00 mg/dL 0.88 0.85 0.83  Sodium 134 - 144 mmol/L 140 141 139  Potassium 3.5 - 5.2 mmol/L 3.7 3.5 3.5  Chloride 96 - 106 mmol/L 100 99 101  CO2 20 - 29 mmol/L 26 27 24   Calcium 8.7 - 10.3 mg/dL 8.8 8.7 8.9  Total Protein 6.0 - 8.5 g/dL - - -  Total Bilirubin 0.0 - 1.2 mg/dL - - -  Alkaline Phos 39 - 117 IU/L - - -  AST 0 - 40 IU/L - - -  ALT 0 - 32 IU/L - - -     STUDIES:   EXAM: 05/15/2020 DIGITAL DIAGNOSTIC BILATERAL MAMMOGRAM WITH TOMO AND CAD  COMPARISON:  Previous exam(s).  ACR Breast Density Category b: There are scattered areas of fibroglandular density.  FINDINGS: Right breast: A spot 2D magnification view of the lumpectomy site was performed in addition to standard views. There are expected postsurgical changes including fat necrosis. No suspicious mass, distortion, or microcalcifications are identified to suggest presence of malignancy.  Left breast: No suspicious mass, distortion, or microcalcifications are identified to suggest presence of malignancy.  Mammographic images were processed with CAD.  IMPRESSION: Expected postsurgical changes in the right breast. No mammographic evidence of malignancy bilaterally.   Allergies: No Known Allergies  Current Medications: Current Outpatient Medications  Medication Sig Dispense Refill  . ALPRAZolam (XANAX) 0.25 MG  tablet Take 0.25 mg by mouth at bedtime.     Marland Kitchen amiodarone (PACERONE) 200 MG tablet Take 1 tablet by mouth once daily 90 tablet 3  . aspirin EC 81 MG tablet Take 81 mg by mouth daily.    Marland Kitchen atorvastatin (LIPITOR) 80 MG tablet Take 1 tablet (80 mg total) by mouth daily. 90 tablet 3  . canagliflozin (INVOKANA) 300 MG TABS tablet Take 300 mg by mouth daily before breakfast.    . furosemide (LASIX) 40 MG tablet Take 1 tablet (40 mg total) by mouth 2 (two) times daily. 60 tablet 3  . JARDIANCE 25 MG TABS tablet Take 25 mg by mouth daily.    Marland Kitchen levothyroxine (SYNTHROID, LEVOTHROID) 50 MCG tablet Take 50 mcg by mouth daily before breakfast.     . meclizine (ANTIVERT) 25 MG tablet Take 25 mg by mouth 3 (three) times daily.     . metFORMIN (GLUCOPHAGE) 850 MG tablet Take 850 mg by mouth 3 (three) times daily.    . metoprolol succinate (TOPROL-XL) 50 MG 24 hr tablet Take 100 mg by mouth daily.    . nitroGLYCERIN (NITROSTAT) 0.4 MG SL tablet Place 1 tablet (0.4 mg total) under the tongue every 5 (five) minutes as needed. 11 tablet 6  . raloxifene (EVISTA) 60 MG tablet Take 1 tablet by mouth once daily 90 tablet 0  . rivaroxaban (XARELTO) 20 MG TABS tablet Take 20 mg by mouth daily with supper.    Marland Kitchen tiZANidine (ZANAFLEX) 4 MG tablet Take 4 mg by mouth 3 (three) times daily as needed.    . traMADol (ULTRAM) 50 MG tablet Take 50 mg by mouth 4 (four) times daily as needed.     No current facility-administered medications for this visit.     ASSESSMENT & PLAN:   Assessment:   1.  Stage 0 ductal carcinoma in situ diagnosed in March 2021, treated with lumpectomy.  Estrogen and progesterone receptors were highly positive.  She completed adjuvant radiation in early June.  She was placed on chemoprevention with Raloxifene 60 mg daily in July 2021, and has tolerated this without difficulty.    2.  Psoriasis.  She was previously on injections every 3 months.  3.  Diabetes.  4.  Mild thrombocytopenia.  5.  Mild  elevation of the liver  transaminases.  She notes that Dr. Delena Bali has been monitoring this.  It may be due to medication and/or her diabetes.  6.  Mild osteopenia.  She will be due for repeat examination in July 2023.  7.  Low normal potassium.  I have given her a list of potassium rich foods to increase this through her diet.  28. Strong family history with 2 members on the paternal side with breast cancer and she makes a 56rd.  She has been seen by [facility] to discuss genetic testing.    Plan: She was placed on chemoprevention with raloxifene 60 mg daily in July 2021 and has tolerated this without difficulty.  As she ran out about 2-3 weeks ago, I will refill this today. We will plan to see her back in 5 months for examination.  If all is well at that time, we can even go to yearly follow up since Dr. Lilia Pro will be seeing her in the winter with mammography.  At a later time she does need to have a 1st colonoscopy.  She understands and agrees to this plan of care.  I have answered her questions and she knows to call with any concerns regarding her disease.  I provided 15 minutes of face-to-face time during this this encounter and > 50% was spent counseling as documented under my assessment and plan.    Derwood Kaplan, MD [facility] AT [facility] [address] [place] [address] Dept: [phone] Dept Fax: [phone]   I, Rita Ohara, am acting as scribe for Derwood Kaplan, MD  I have reviewed this report as typed by the medical scribe, and it is complete and accurate.  Hermina Barters

## 2020-07-31 ENCOUNTER — Inpatient Hospital Stay: Payer: PPO | Attending: Oncology | Admitting: Oncology

## 2020-07-31 ENCOUNTER — Other Ambulatory Visit: Payer: Self-pay

## 2020-07-31 ENCOUNTER — Inpatient Hospital Stay: Payer: PPO

## 2020-07-31 ENCOUNTER — Other Ambulatory Visit: Payer: Self-pay | Admitting: Oncology

## 2020-07-31 ENCOUNTER — Telehealth: Payer: Self-pay | Admitting: Oncology

## 2020-07-31 ENCOUNTER — Encounter: Payer: Self-pay | Admitting: Oncology

## 2020-07-31 VITALS — BP 140/64 | HR 60 | Temp 98.3°F | Resp 18 | Ht 61.0 in | Wt 213.2 lb

## 2020-07-31 DIAGNOSIS — D0511 Intraductal carcinoma in situ of right breast: Secondary | ICD-10-CM

## 2020-07-31 DIAGNOSIS — Z780 Asymptomatic menopausal state: Secondary | ICD-10-CM | POA: Diagnosis not present

## 2020-07-31 DIAGNOSIS — L409 Psoriasis, unspecified: Secondary | ICD-10-CM | POA: Insufficient documentation

## 2020-07-31 DIAGNOSIS — M8580 Other specified disorders of bone density and structure, unspecified site: Secondary | ICD-10-CM | POA: Diagnosis not present

## 2020-07-31 HISTORY — DX: Psoriasis, unspecified: L40.9

## 2020-07-31 MED ORDER — RALOXIFENE HCL 60 MG PO TABS: 60.0000 mg | ORAL_TABLET | Freq: Every day | ORAL | 3 refills | Status: DC

## 2020-07-31 NOTE — Telephone Encounter (Signed)
07/31/20 Spoke with patient and scheduled next appt

## 2020-08-13 DIAGNOSIS — I110 Hypertensive heart disease with heart failure: Secondary | ICD-10-CM | POA: Diagnosis not present

## 2020-08-13 DIAGNOSIS — I480 Paroxysmal atrial fibrillation: Secondary | ICD-10-CM | POA: Diagnosis not present

## 2020-08-13 DIAGNOSIS — R079 Chest pain, unspecified: Secondary | ICD-10-CM | POA: Diagnosis not present

## 2020-08-13 DIAGNOSIS — I249 Acute ischemic heart disease, unspecified: Secondary | ICD-10-CM | POA: Diagnosis not present

## 2020-08-13 DIAGNOSIS — I5023 Acute on chronic systolic (congestive) heart failure: Secondary | ICD-10-CM | POA: Diagnosis not present

## 2020-08-14 DIAGNOSIS — R001 Bradycardia, unspecified: Secondary | ICD-10-CM | POA: Diagnosis not present

## 2020-08-14 DIAGNOSIS — Z23 Encounter for immunization: Secondary | ICD-10-CM | POA: Diagnosis not present

## 2020-08-14 DIAGNOSIS — M1812 Unilateral primary osteoarthritis of first carpometacarpal joint, left hand: Secondary | ICD-10-CM | POA: Diagnosis not present

## 2020-08-14 DIAGNOSIS — I5023 Acute on chronic systolic (congestive) heart failure: Secondary | ICD-10-CM | POA: Diagnosis not present

## 2020-08-14 DIAGNOSIS — Z7901 Long term (current) use of anticoagulants: Secondary | ICD-10-CM | POA: Diagnosis not present

## 2020-08-14 DIAGNOSIS — M109 Gout, unspecified: Secondary | ICD-10-CM | POA: Diagnosis not present

## 2020-08-14 DIAGNOSIS — E7800 Pure hypercholesterolemia, unspecified: Secondary | ICD-10-CM | POA: Diagnosis not present

## 2020-08-14 DIAGNOSIS — I2510 Atherosclerotic heart disease of native coronary artery without angina pectoris: Secondary | ICD-10-CM | POA: Diagnosis not present

## 2020-08-14 DIAGNOSIS — E785 Hyperlipidemia, unspecified: Secondary | ICD-10-CM | POA: Diagnosis not present

## 2020-08-14 DIAGNOSIS — I110 Hypertensive heart disease with heart failure: Secondary | ICD-10-CM | POA: Diagnosis not present

## 2020-08-14 DIAGNOSIS — Z7984 Long term (current) use of oral hypoglycemic drugs: Secondary | ICD-10-CM | POA: Diagnosis not present

## 2020-08-14 DIAGNOSIS — Z853 Personal history of malignant neoplasm of breast: Secondary | ICD-10-CM | POA: Diagnosis not present

## 2020-08-14 DIAGNOSIS — Z6835 Body mass index (BMI) 35.0-35.9, adult: Secondary | ICD-10-CM | POA: Diagnosis not present

## 2020-08-14 DIAGNOSIS — I350 Nonrheumatic aortic (valve) stenosis: Secondary | ICD-10-CM | POA: Diagnosis not present

## 2020-08-14 DIAGNOSIS — Z79899 Other long term (current) drug therapy: Secondary | ICD-10-CM | POA: Diagnosis not present

## 2020-08-14 DIAGNOSIS — I10 Essential (primary) hypertension: Secondary | ICD-10-CM | POA: Diagnosis not present

## 2020-08-14 DIAGNOSIS — R079 Chest pain, unspecified: Secondary | ICD-10-CM | POA: Diagnosis not present

## 2020-08-14 DIAGNOSIS — E039 Hypothyroidism, unspecified: Secondary | ICD-10-CM | POA: Diagnosis not present

## 2020-08-14 DIAGNOSIS — E119 Type 2 diabetes mellitus without complications: Secondary | ICD-10-CM | POA: Diagnosis not present

## 2020-08-14 DIAGNOSIS — E669 Obesity, unspecified: Secondary | ICD-10-CM | POA: Diagnosis not present

## 2020-08-14 DIAGNOSIS — I5031 Acute diastolic (congestive) heart failure: Secondary | ICD-10-CM | POA: Diagnosis not present

## 2020-08-14 DIAGNOSIS — I249 Acute ischemic heart disease, unspecified: Secondary | ICD-10-CM | POA: Diagnosis not present

## 2020-08-14 DIAGNOSIS — R7401 Elevation of levels of liver transaminase levels: Secondary | ICD-10-CM | POA: Diagnosis not present

## 2020-08-14 DIAGNOSIS — D649 Anemia, unspecified: Secondary | ICD-10-CM | POA: Diagnosis not present

## 2020-08-14 DIAGNOSIS — I480 Paroxysmal atrial fibrillation: Secondary | ICD-10-CM | POA: Diagnosis not present

## 2020-08-15 DIAGNOSIS — I110 Hypertensive heart disease with heart failure: Secondary | ICD-10-CM | POA: Diagnosis not present

## 2020-08-15 DIAGNOSIS — Z6835 Body mass index (BMI) 35.0-35.9, adult: Secondary | ICD-10-CM | POA: Diagnosis not present

## 2020-08-15 DIAGNOSIS — I2510 Atherosclerotic heart disease of native coronary artery without angina pectoris: Secondary | ICD-10-CM | POA: Diagnosis not present

## 2020-08-15 DIAGNOSIS — I10 Essential (primary) hypertension: Secondary | ICD-10-CM | POA: Diagnosis not present

## 2020-08-15 DIAGNOSIS — I480 Paroxysmal atrial fibrillation: Secondary | ICD-10-CM | POA: Diagnosis not present

## 2020-08-15 DIAGNOSIS — E7800 Pure hypercholesterolemia, unspecified: Secondary | ICD-10-CM | POA: Diagnosis not present

## 2020-08-15 DIAGNOSIS — Z853 Personal history of malignant neoplasm of breast: Secondary | ICD-10-CM | POA: Diagnosis not present

## 2020-08-15 DIAGNOSIS — Z79899 Other long term (current) drug therapy: Secondary | ICD-10-CM | POA: Diagnosis not present

## 2020-08-15 DIAGNOSIS — R001 Bradycardia, unspecified: Secondary | ICD-10-CM | POA: Diagnosis not present

## 2020-08-15 DIAGNOSIS — M109 Gout, unspecified: Secondary | ICD-10-CM | POA: Diagnosis not present

## 2020-08-15 DIAGNOSIS — E039 Hypothyroidism, unspecified: Secondary | ICD-10-CM | POA: Diagnosis not present

## 2020-08-15 DIAGNOSIS — E119 Type 2 diabetes mellitus without complications: Secondary | ICD-10-CM | POA: Diagnosis not present

## 2020-08-15 DIAGNOSIS — E669 Obesity, unspecified: Secondary | ICD-10-CM | POA: Diagnosis not present

## 2020-08-15 DIAGNOSIS — M1812 Unilateral primary osteoarthritis of first carpometacarpal joint, left hand: Secondary | ICD-10-CM | POA: Diagnosis not present

## 2020-08-15 DIAGNOSIS — R7401 Elevation of levels of liver transaminase levels: Secondary | ICD-10-CM | POA: Diagnosis not present

## 2020-08-15 DIAGNOSIS — I5031 Acute diastolic (congestive) heart failure: Secondary | ICD-10-CM | POA: Diagnosis not present

## 2020-08-15 DIAGNOSIS — Z7984 Long term (current) use of oral hypoglycemic drugs: Secondary | ICD-10-CM | POA: Diagnosis not present

## 2020-08-15 DIAGNOSIS — I5023 Acute on chronic systolic (congestive) heart failure: Secondary | ICD-10-CM | POA: Diagnosis not present

## 2020-08-15 DIAGNOSIS — E785 Hyperlipidemia, unspecified: Secondary | ICD-10-CM | POA: Diagnosis not present

## 2020-08-15 DIAGNOSIS — D649 Anemia, unspecified: Secondary | ICD-10-CM | POA: Diagnosis not present

## 2020-08-15 DIAGNOSIS — I350 Nonrheumatic aortic (valve) stenosis: Secondary | ICD-10-CM | POA: Diagnosis not present

## 2020-08-15 DIAGNOSIS — Z23 Encounter for immunization: Secondary | ICD-10-CM | POA: Diagnosis not present

## 2020-08-15 DIAGNOSIS — Z7901 Long term (current) use of anticoagulants: Secondary | ICD-10-CM | POA: Diagnosis not present

## 2020-08-21 DIAGNOSIS — E785 Hyperlipidemia, unspecified: Secondary | ICD-10-CM | POA: Diagnosis not present

## 2020-08-21 DIAGNOSIS — I5033 Acute on chronic diastolic (congestive) heart failure: Secondary | ICD-10-CM | POA: Diagnosis not present

## 2020-08-21 DIAGNOSIS — E1169 Type 2 diabetes mellitus with other specified complication: Secondary | ICD-10-CM | POA: Diagnosis not present

## 2020-08-21 DIAGNOSIS — H8113 Benign paroxysmal vertigo, bilateral: Secondary | ICD-10-CM | POA: Diagnosis not present

## 2020-08-21 DIAGNOSIS — R079 Chest pain, unspecified: Secondary | ICD-10-CM | POA: Diagnosis not present

## 2020-08-21 DIAGNOSIS — I480 Paroxysmal atrial fibrillation: Secondary | ICD-10-CM | POA: Diagnosis not present

## 2020-09-16 ENCOUNTER — Other Ambulatory Visit: Payer: Self-pay

## 2020-09-19 ENCOUNTER — Other Ambulatory Visit: Payer: Self-pay | Admitting: Oncology

## 2020-09-19 DIAGNOSIS — D0511 Intraductal carcinoma in situ of right breast: Secondary | ICD-10-CM

## 2020-09-23 ENCOUNTER — Other Ambulatory Visit: Payer: Self-pay

## 2020-09-23 ENCOUNTER — Encounter: Payer: Self-pay | Admitting: Cardiology

## 2020-09-23 ENCOUNTER — Ambulatory Visit: Payer: PPO | Admitting: Cardiology

## 2020-09-23 VITALS — BP 116/68 | HR 56 | Ht 61.0 in | Wt 221.4 lb

## 2020-09-23 DIAGNOSIS — I10 Essential (primary) hypertension: Secondary | ICD-10-CM | POA: Diagnosis not present

## 2020-09-23 DIAGNOSIS — I2510 Atherosclerotic heart disease of native coronary artery without angina pectoris: Secondary | ICD-10-CM | POA: Diagnosis not present

## 2020-09-23 DIAGNOSIS — I480 Paroxysmal atrial fibrillation: Secondary | ICD-10-CM | POA: Diagnosis not present

## 2020-09-23 DIAGNOSIS — E088 Diabetes mellitus due to underlying condition with unspecified complications: Secondary | ICD-10-CM | POA: Diagnosis not present

## 2020-09-23 NOTE — Progress Notes (Signed)
Cardiology Office Note:    Date:  09/23/2020   ID:  Crystal Ramos, DOB Dec 01, 1955, MRN [id number]  PCP:  Nicoletta Dress, MD  Cardiologist:  Jenean Lindau, MD   Referring MD: Nicoletta Dress, MD    ASSESSMENT:    1. Paroxysmal atrial fibrillation (HCC)   2. Essential hypertension   3. Coronary artery disease involving native coronary artery of native heart without angina pectoris   4. Diabetes mellitus due to underlying condition with unspecified complications ([place])   5. Morbid obesity ([place])    PLAN:    In order of problems listed above:  1. Coronary artery disease: Secondary prevention stressed with the patient.  Importance of compliance with diet medication stressed and she vocalized understanding.  I told her to walk on a regular basis.  She promises to do so. 2. Paroxysmal atrial fibrillation:I discussed with the patient atrial fibrillation, disease process. Management and therapy including rate and rhythm control, anticoagulation benefits and potential risks were discussed extensively with the patient. Patient had multiple questions which were answered to patient's satisfaction. 3. Bilateral pedal edema: I told her to cut fluid intake in the diet.  Her diuretic has already been increased.  I will do a Chem-7 today.  CHF education was given. 4. Morbid obesity: Risks of obesity explained and weight reduction was stressed and she promises to do better. 5. Essential hypertension: Mixed dyslipidemia and diabetes mellitus: Lifestyle modification was urged.  Patient says she would like to do better.  Follow-up appointment in a month or earlier if she has any concerns.   Medication Adjustments/Labs and Tests Ordered: Current medicines are reviewed at length with the patient today.  Concerns regarding medicines are outlined above.  No orders of the defined types were placed in this encounter.  No orders of the defined types were placed in this encounter.    No chief  complaint on file.    History of Present Illness:    Crystal Ramos is a 65 y.o. female.  Patient has past medical history of coronary artery disease, essential hypertension dyslipidemia diabetes mellitus, paroxysmal atrial fibrillation and morbid obesity.  She went to the hospital with some chest pain like symptoms and also was treated for congestive heart failure.  Subsequently she is done fine.  I reviewed records extensively including notes on consultation by my partner.  Patient mentions to me that since hospital discharge she is feeling better.  At the time of my evaluation, the patient is alert awake oriented and in no distress.  She has swelling in bilateral lower extremities and it does not seem like she is doing any fluid restriction.  Matter-of-fact she tells me that she is taking significant amount of water on a daily basis.  Past Medical History:  Diagnosis Date  . Acute diastolic heart failure ([place])   . Angina pectoris ([place]) 03/27/2019  . 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 Atrial fibrillation ([place])   . Bilateral carotid bruits 01/05/2019  . Cardiac murmur 03/18/2020  . Chest pain   . Chronic diastolic heart failure ([place]) 03/08/2019  . Coronary artery disease involving native coronary artery of native heart without angina pectoris   . Diabetes mellitus due to underlying condition with unspecified complications ([place]) 27/0/3500  . 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 Dyslipidemia 02/25/2017  . Dyspnea on exertion 12/18/2015  . Essential hypertension   . Hematuria 11/29/2017  . Morbid obesity ([place]) 02/25/2017  .  Osteopenia after menopause 12/01/2019  . Paroxysmal atrial fibrillation ([place]) 02/25/2017  . Psoriasis 07/31/2020  . Type 2 diabetes mellitus without complication ([place]) 11/17/2200    Past Surgical History:  Procedure Laterality Date  . CHOLECYSTECTOMY    . LEFT HEART CATH AND CORONARY ANGIOGRAPHY N/A 04/03/2019    Procedure: LEFT HEART CATH AND CORONARY ANGIOGRAPHY;  Surgeon: Burnell Blanks, MD;  Location: [facility];  Service: Cardiovascular;  Laterality: N/A;  . MEDIAL PARTIAL KNEE REPLACEMENT      Current Medications: Current Meds  Medication Sig  . ALPRAZolam (XANAX) 0.25 MG tablet Take 0.25 mg by mouth at bedtime.   Marland Kitchen amiodarone (PACERONE) 200 MG tablet Take 1 tablet by mouth once daily  . aspirin EC 81 MG tablet Take 81 mg by mouth daily.  Marland Kitchen atorvastatin (LIPITOR) 80 MG tablet Take 1 tablet (80 mg total) by mouth daily.  . canagliflozin (INVOKANA) 300 MG TABS tablet Take 300 mg by mouth daily before breakfast.  . EUTHYROX 88 MCG tablet Take 88 mcg by mouth daily.  . furosemide (LASIX) 40 MG tablet Take 40 mg by mouth daily.  . isosorbide mononitrate (IMDUR) 30 MG 24 hr tablet Take 30 mg by mouth daily.  Marland Kitchen JARDIANCE 25 MG TABS tablet Take 25 mg by mouth daily.  . meclizine (ANTIVERT) 25 MG tablet Take 25 mg by mouth 3 (three) times daily.   . metFORMIN (GLUCOPHAGE) 850 MG tablet Take 850 mg by mouth 3 (three) times daily.  . metoprolol succinate (TOPROL-XL) 50 MG 24 hr tablet Take 100 mg by mouth daily.  . nitroGLYCERIN (NITROSTAT) 0.4 MG SL tablet Place 0.4 mg under the tongue every 5 (five) minutes as needed for chest pain.  . raloxifene (EVISTA) 60 MG tablet Take 1 tablet by mouth once daily  . rivaroxaban (XARELTO) 20 MG TABS tablet Take 20 mg by mouth daily with supper.  Marland Kitchen tiZANidine (ZANAFLEX) 4 MG tablet Take 4 mg by mouth 3 (three) times daily as needed for muscle spasms.  . traMADol (ULTRAM) 50 MG tablet Take 50 mg by mouth 4 (four) times daily as needed for pain.     Allergies:   Patient has no known allergies.   Social History   Socioeconomic History  . Marital status: Married    Spouse name: Not on file  . Number of children: Not on file  . Years of education: Not on file  . Highest education level: Not on file  Occupational History  . Not on file   Tobacco Use  . Smoking status: Passive Smoke Exposure - Never Smoker  . Smokeless tobacco: Never Used  . Tobacco comment: "whole family" smoked in the house  Vaping Use  . Vaping Use: Never used  Substance and Sexual Activity  . Alcohol use: No  . Drug use: No  . Sexual activity: Not on file  Other Topics Concern  . Not on file  Social History Narrative  . Not on file   Social Determinants of Health   Financial Resource Strain: Not on file  Food Insecurity: Not on file  Transportation Needs: Not on file  Physical Activity: Not on file  Stress: Not on file  Social Connections: Not on file     Family History: The patient's family history includes Breast cancer in her paternal aunt; Diabetes in her mother; Stroke in her father and paternal aunt.  ROS:   Please see the history of present illness.    All other systems reviewed  and are negative.  EKGs/Labs/Other Studies Reviewed:    The following studies were reviewed today: EKG reveals sinus rhythm first-degree AV block and nonspecific ST-T changes   Recent Labs: No results found for requested labs within last 8760 hours.  Recent Lipid Panel    Component Value Date/Time   CHOL 168 06/07/2017 0912   TRIG 107 06/07/2017 0912   HDL 60 06/07/2017 0912   CHOLHDL 2.8 06/07/2017 0912   LDLCALC 87 06/07/2017 0912    Physical Exam:    VS:  BP 116/68   Pulse (!) 56   Ht 5\' 1"  (1.549 m)   Wt 221 lb 6.4 oz (100.4 kg)   SpO2 97%   BMI 41.83 kg/m     Wt Readings from Last 3 Encounters:  09/23/20 221 lb 6.4 oz (100.4 kg)  07/31/20 213 lb 3.2 oz (96.7 kg)  03/18/20 222 lb (100.7 kg)     GEN: Patient is in no acute distress HEENT: Normal NECK: No JVD; No carotid bruits LYMPHATICS: No lymphadenopathy CARDIAC: Hear sounds regular, 2/6 systolic murmur at the apex. RESPIRATORY:  Clear to auscultation without rales, wheezing or rhonchi  ABDOMEN: Soft, non-tender, non-distended MUSCULOSKELETAL:  No edema; No deformity   SKIN: Warm and dry NEUROLOGIC:  Alert and oriented x 3 PSYCHIATRIC:  Normal affect   Signed, Jenean Lindau, MD  09/23/2020 12:16 PM    [place]

## 2020-09-23 NOTE — Patient Instructions (Signed)
Medication Instructions:  No medication changes. *If you need a refill on your cardiac medications before your next appointment, please call your pharmacy*   Lab Work: You have a BMET done today in the office. If you have labs (blood work) drawn today and your tests are completely normal, you will receive your results only by: Marland Kitchen MyChart Message (if you have MyChart) OR . A paper copy in the mail If you have any lab test that is abnormal or we need to change your treatment, we will call you to review the results.   Testing/Procedures: None ordered   Follow-Up: At [facility], you and your health needs are our priority.  As part of our continuing mission to provide you with exceptional heart care, we have created designated Provider Care Teams.  These Care Teams include your primary Cardiologist (physician) and Advanced Practice Providers (APPs -  Physician Assistants and Nurse Practitioners) who all work together to provide you with the care you need, when you need it.  We recommend signing up for the patient portal called "MyChart".  Sign up information is provided on this After Visit Summary.  MyChart is used to connect with patients for Virtual Visits (Telemedicine).  Patients are able to view lab/test results, encounter notes, upcoming appointments, etc.  Non-urgent messages can be sent to your provider as well.   To learn more about what you can do with MyChart, go to [email or web address].    Your next appointment:   1 month(s)  The format for your next appointment:   In Person  Provider:   Jyl Heinz, MD   Other Instructions NA

## 2020-09-24 ENCOUNTER — Ambulatory Visit: Payer: PPO | Admitting: Cardiology

## 2020-09-24 LAB — BASIC METABOLIC PANEL
BUN/Creatinine Ratio: 22 (ref 12–28)
BUN: 16 mg/dL (ref 8–27)
CO2: 24 mmol/L (ref 20–29)
Calcium: 8.6 mg/dL — ABNORMAL LOW (ref 8.7–10.3)
Chloride: 103 mmol/L (ref 96–106)
Creatinine, Ser: 0.72 mg/dL (ref 0.57–1.00)
Glucose: 232 mg/dL — ABNORMAL HIGH (ref 65–99)
Potassium: 3.8 mmol/L (ref 3.5–5.2)
Sodium: 141 mmol/L (ref 134–144)
eGFR: 92 mL/min/{1.73_m2} (ref >59)

## 2020-10-10 ENCOUNTER — Other Ambulatory Visit: Payer: Self-pay | Admitting: Cardiology

## 2020-10-28 ENCOUNTER — Ambulatory Visit (INDEPENDENT_AMBULATORY_CARE_PROVIDER_SITE_OTHER): Payer: PPO | Admitting: Cardiology

## 2020-10-28 ENCOUNTER — Encounter: Payer: Self-pay | Admitting: Cardiology

## 2020-10-28 ENCOUNTER — Other Ambulatory Visit: Payer: Self-pay

## 2020-10-28 VITALS — BP 109/64 | HR 54 | Ht 61.0 in | Wt 222.8 lb

## 2020-10-28 DIAGNOSIS — I2510 Atherosclerotic heart disease of native coronary artery without angina pectoris: Secondary | ICD-10-CM | POA: Diagnosis not present

## 2020-10-28 DIAGNOSIS — E119 Type 2 diabetes mellitus without complications: Secondary | ICD-10-CM

## 2020-10-28 DIAGNOSIS — I480 Paroxysmal atrial fibrillation: Secondary | ICD-10-CM

## 2020-10-28 DIAGNOSIS — E785 Hyperlipidemia, unspecified: Secondary | ICD-10-CM

## 2020-10-28 DIAGNOSIS — E088 Diabetes mellitus due to underlying condition with unspecified complications: Secondary | ICD-10-CM

## 2020-10-28 LAB — LIPID PANEL
Chol/HDL Ratio: 2.7 ratio (ref 0.0–4.4)
Cholesterol, Total: 107 mg/dL (ref 100–199)
HDL: 39 mg/dL — ABNORMAL LOW (ref >39)
LDL Chol Calc (NIH): 52 mg/dL (ref 0–99)
Triglycerides: 79 mg/dL (ref 0–149)
VLDL Cholesterol Cal: 16 mg/dL (ref 5–40)

## 2020-10-28 LAB — BASIC METABOLIC PANEL
BUN/Creatinine Ratio: 21 (ref 12–28)
BUN: 17 mg/dL (ref 8–27)
CO2: 24 mmol/L (ref 20–29)
Calcium: 8.6 mg/dL — ABNORMAL LOW (ref 8.7–10.3)
Chloride: 103 mmol/L (ref 96–106)
Creatinine, Ser: 0.81 mg/dL (ref 0.57–1.00)
Glucose: 100 mg/dL — ABNORMAL HIGH (ref 65–99)
Potassium: 4 mmol/L (ref 3.5–5.2)
Sodium: 140 mmol/L (ref 134–144)
eGFR: 80 mL/min/{1.73_m2} (ref >59)

## 2020-10-28 LAB — HEPATIC FUNCTION PANEL
ALT: 16 IU/L (ref 0–32)
AST: 29 IU/L (ref 0–40)
Albumin: 3.5 g/dL — ABNORMAL LOW (ref 3.8–4.8)
Alkaline Phosphatase: 92 IU/L (ref 44–121)
Bilirubin Total: 0.7 mg/dL (ref 0.0–1.2)
Bilirubin, Direct: 0.26 mg/dL (ref 0.00–0.40)
Total Protein: 6.4 g/dL (ref 6.0–8.5)

## 2020-10-28 MED ORDER — AMIODARONE HCL 200 MG PO TABS: 100.0000 mg | ORAL_TABLET | Freq: Every day | ORAL | 3 refills | Status: DC

## 2020-10-28 NOTE — Patient Instructions (Signed)
Medication Instructions:  Your physician has recommended you make the following change in your medication:   Decrease your amiodarone to 100 mg daily. Stop Isosorbide.  *If you need a refill on your cardiac medications before your next appointment, please call your pharmacy*   Lab Work: Your physician recommends that you have labs done in the office today. Your test included  basic metabolic panel, liver function and lipids.  If you have labs (blood work) drawn today and your tests are completely normal, you will receive your results only by: [place] (if you have MyChart) OR A paper copy in the mail If you have any lab test that is abnormal or we need to change your treatment, we will call you to review the results.   Testing/Procedures: None ordered   Follow-Up: At [facility], you and your health needs are our priority.  As part of our continuing mission to provide you with exceptional heart care, we have created designated Provider Care Teams.  These Care Teams include your primary Cardiologist (physician) and Advanced Practice Providers (APPs -  Physician Assistants and Nurse Practitioners) who all work together to provide you with the care you need, when you need it.  We recommend signing up for the patient portal called "MyChart".  Sign up information is provided on this After Visit Summary.  MyChart is used to connect with patients for Virtual Visits (Telemedicine).  Patients are able to view lab/test results, encounter notes, upcoming appointments, etc.  Non-urgent messages can be sent to your provider as well.   To learn more about what you can do with MyChart, go to [email or web address].    Your next appointment:   3 month(s)  The format for your next appointment:   In Person  Provider:   Jyl Heinz, MD   Other Instructions NA

## 2020-10-28 NOTE — Progress Notes (Signed)
Cardiology Office Note:    Date:  10/28/2020   ID:  Crystal Ramos, DOB 11-Mar-1956, MRN [id number]  PCP:  Nicoletta Dress, MD  Cardiologist:  Jenean Lindau, MD   Referring MD: Nicoletta Dress, MD    ASSESSMENT:    1. Paroxysmal atrial fibrillation (HCC)   2. Type 2 diabetes mellitus without complication, without long-term current use of insulin ([place])   3. Dyslipidemia   4. Coronary artery disease involving native coronary artery of native heart without angina pectoris   5. Diabetes mellitus due to underlying condition with unspecified complications ([place])   6. Morbid obesity ([place])    PLAN:    In order of problems listed above:  Coronary artery disease: Secondary prevention stressed with the patient.  Importance of compliance with diet medication stressed and she vocalized understanding.  I told her to walk on a regular basis.  She leads a sedentary lifestyle and risks explained. Paroxysmal atrial fibrillation:I discussed with the patient atrial fibrillation, disease process. Management and therapy including rate and rhythm control, anticoagulation benefits and potential risks were discussed extensively with the patient. Patient had multiple questions which were answered to patient's satisfaction.  I mentioned to the patient to cut down amiodarone 100 mg a day.  She will have a Chem-7 LFTs and TSH done today. Essential hypertension: Blood pressure stable.  Matter-of-fact blood pressure is borderline and she sometimes gives symptoms suggesting postural hypotension so I discontinued Imdur.  She has no chest pain issues. Mixed dyslipidemia diabetes mellitus and morbid obesity: Diet was emphasized.  Weight reduction was stressed risks of this syndrome was explained and she says that she is promising to do better.  She is not very compliant with dietary advice. Patient will be seen in follow-up appointment in 3 months or earlier if the patient has any concerns    Medication  Adjustments/Labs and Tests Ordered: Current medicines are reviewed at length with the patient today.  Concerns regarding medicines are outlined above.  Orders Placed This Encounter  Procedures   Basic metabolic panel   Hepatic function panel   Lipid panel   EKG 12-Lead   No orders of the defined types were placed in this encounter.    No chief complaint on file.    History of Present Illness:    Crystal Ramos is a 65 y.o. female.  Patient has past medical history of coronary artery disease, paroxysmal defibrillation, essential hypertension, dyslipidemia diabetes mellitus and morbid obesity.  She denies any problems at this time and takes care of activities of daily living.  No chest pain orthopnea or PND.  At the time of my evaluation, the patient is alert awake oriented and in no distress.  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 08/01/2019   Dyslipidemia 02/25/2017   Dyspnea on exertion 12/18/2015   Essential hypertension    Hematuria 11/29/2017   Morbid obesity ([place]) 02/25/2017   Osteopenia after menopause 12/01/2019  Paroxysmal atrial fibrillation ([place]) 02/25/2017   Psoriasis 07/31/2020   Type 2 diabetes mellitus without complication ([place]) 7/0/3500    Past Surgical History:  Procedure Laterality Date   CHOLECYSTECTOMY     LEFT HEART CATH AND CORONARY ANGIOGRAPHY N/A 04/03/2019   Procedure: LEFT HEART CATH AND CORONARY ANGIOGRAPHY;   Surgeon: Burnell Blanks, MD;  Location: [facility];  Service: Cardiovascular;  Laterality: N/A;   MEDIAL PARTIAL KNEE REPLACEMENT      Current Medications: Current Meds  Medication Sig   ALPRAZolam (XANAX) 0.25 MG tablet Take 0.25 mg by mouth at bedtime.    amiodarone (PACERONE) 200 MG tablet Take 1 tablet by mouth once daily   aspirin EC 81 MG tablet Take 81 mg by mouth daily.   atorvastatin (LIPITOR) 80 MG tablet Take 1 tablet by mouth once daily   canagliflozin (INVOKANA) 300 MG TABS tablet Take 300 mg by mouth daily before breakfast.   EUTHYROX 88 MCG tablet Take 88 mcg by mouth daily.   furosemide (LASIX) 40 MG tablet Take 40 mg by mouth daily.   isosorbide mononitrate (IMDUR) 30 MG 24 hr tablet Take 30 mg by mouth daily.   JARDIANCE 25 MG TABS tablet Take 25 mg by mouth daily.   meclizine (ANTIVERT) 25 MG tablet Take 25 mg by mouth 3 (three) times daily.    metFORMIN (GLUCOPHAGE) 850 MG tablet Take 850 mg by mouth 3 (three) times daily.   metoprolol succinate (TOPROL-XL) 50 MG 24 hr tablet Take 100 mg by mouth daily.   nitroGLYCERIN (NITROSTAT) 0.4 MG SL tablet Place 0.4 mg under the tongue every 5 (five) minutes as needed for chest pain.   raloxifene (EVISTA) 60 MG tablet Take 1 tablet by mouth once daily   rivaroxaban (XARELTO) 20 MG TABS tablet Take 20 mg by mouth daily with supper.   tiZANidine (ZANAFLEX) 4 MG tablet Take 4 mg by mouth 3 (three) times daily as needed for muscle spasms.   traMADol (ULTRAM) 50 MG tablet Take 50 mg by mouth 4 (four) times daily as needed for pain.     Allergies:   Patient has no known allergies.   Social History   Socioeconomic History   Marital status: Married    Spouse name: Not on file   Number of children: Not on file   Years of education: Not on file   Highest education level: Not on file  Occupational History   Not on file  Tobacco Use   Smoking status: Passive Smoke Exposure - Never Smoker   Smokeless tobacco:  Never   Tobacco comments:    "whole family" smoked in the house  Vaping Use   Vaping Use: Never used  Substance and Sexual Activity   Alcohol use: No   Drug use: No   Sexual activity: Not on file  Other Topics Concern   Not on file  Social History Narrative   Not on file   Social Determinants of Health   Financial Resource Strain: Not on file  Food Insecurity: Not on file  Transportation Needs: Not on file  Physical Activity: Not on file  Stress: Not on file  Social Connections: Not on file     Family History: The patient's family history includes Breast cancer in her paternal aunt; Diabetes in her mother; Stroke in her father and paternal aunt.  ROS:   Please see the history of present illness.    All other systems reviewed and are negative.  EKGs/Labs/Other Studies  Reviewed:    The following studies were reviewed today: I discussed my findings with the patient in extensive length   Recent Labs: 09/23/2020: BUN 16; Creatinine, Ser 0.72; Potassium 3.8; Sodium 141  Recent Lipid Panel    Component Value Date/Time   CHOL 168 06/07/2017 0912   TRIG 107 06/07/2017 0912   HDL 60 06/07/2017 0912   CHOLHDL 2.8 06/07/2017 0912   LDLCALC 87 06/07/2017 0912    Physical Exam:    VS:  BP 109/64   Pulse (!) 54   Ht 5\' 1"  (1.549 m)   Wt 222 lb 12.8 oz (101.1 kg)   SpO2 94%   BMI 42.10 kg/m     Wt Readings from Last 3 Encounters:  10/28/20 222 lb 12.8 oz (101.1 kg)  09/23/20 221 lb 6.4 oz (100.4 kg)  07/31/20 213 lb 3.2 oz (96.7 kg)     GEN: Patient is in no acute distress HEENT: Normal NECK: No JVD; No carotid bruits LYMPHATICS: No lymphadenopathy CARDIAC: Hear sounds regular, 2/6 systolic murmur at the apex. RESPIRATORY:  Clear to auscultation without rales, wheezing or rhonchi  ABDOMEN: Soft, non-tender, non-distended MUSCULOSKELETAL:  No edema; No deformity  SKIN: Warm and dry NEUROLOGIC:  Alert and oriented x 3 PSYCHIATRIC:  Normal affect    Signed, Jenean Lindau, MD  10/28/2020 10:19 AM    [place]

## 2020-11-22 DIAGNOSIS — Z6841 Body Mass Index (BMI) 40.0 and over, adult: Secondary | ICD-10-CM | POA: Diagnosis not present

## 2020-11-22 DIAGNOSIS — I10 Essential (primary) hypertension: Secondary | ICD-10-CM | POA: Diagnosis not present

## 2020-11-22 DIAGNOSIS — Z713 Dietary counseling and surveillance: Secondary | ICD-10-CM | POA: Diagnosis not present

## 2020-11-22 DIAGNOSIS — E039 Hypothyroidism, unspecified: Secondary | ICD-10-CM | POA: Diagnosis not present

## 2020-11-22 DIAGNOSIS — I480 Paroxysmal atrial fibrillation: Secondary | ICD-10-CM | POA: Diagnosis not present

## 2020-11-22 DIAGNOSIS — Z79899 Other long term (current) drug therapy: Secondary | ICD-10-CM | POA: Diagnosis not present

## 2020-11-22 DIAGNOSIS — I5032 Chronic diastolic (congestive) heart failure: Secondary | ICD-10-CM | POA: Diagnosis not present

## 2020-11-22 DIAGNOSIS — E1169 Type 2 diabetes mellitus with other specified complication: Secondary | ICD-10-CM | POA: Diagnosis not present

## 2020-11-22 DIAGNOSIS — E785 Hyperlipidemia, unspecified: Secondary | ICD-10-CM | POA: Diagnosis not present

## 2020-11-26 DIAGNOSIS — D509 Iron deficiency anemia, unspecified: Secondary | ICD-10-CM | POA: Diagnosis not present

## 2020-12-13 ENCOUNTER — Telehealth: Payer: Self-pay | Admitting: Cardiology

## 2020-12-13 MED ORDER — AMIODARONE HCL 200 MG PO TABS: 100.0000 mg | ORAL_TABLET | Freq: Every day | ORAL | 3 refills | Status: DC

## 2020-12-13 NOTE — Telephone Encounter (Signed)
Refill sent in per request.  

## 2020-12-13 NOTE — Telephone Encounter (Signed)
*  STAT* If patient is at the pharmacy, call can be transferred to refill team.   1. Which medications need to be refilled? (please list name of each medication and dose if known) Amiodarone   2. Which pharmacy/location (including street and city if local pharmacy) is medication to be sent to? [facility]  3. Do they need a 30 day or 90 day supply? 90    Patient is out

## 2020-12-31 ENCOUNTER — Inpatient Hospital Stay: Payer: PPO | Attending: Oncology | Admitting: Hematology and Oncology

## 2020-12-31 NOTE — Progress Notes (Deleted)
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 female with stage 0 right breast cancer diagnosed in March 2021.  This began in December when she went in for annual mammography which revealed suspicious calcifications within the right breast.  Unilateral right mammogram from February 8th confirmed suspicious developing calcifications in the upper inner quadrant of the right breast spanning an area of 1 cm.  Stereotactic biopsy from March 4th confirmed calcifications associated with high grade ductal carcinoma in situ with comedo necrosis.  No invasive carcinoma was identified.  Bilateral breast MRI from March 18th was negative for additional sites of malignancy.  She was then referred to Dr. Lilia Pro and on March 29th underwent right breast lumpectomy and attempted sentinel lymph node biopsy.  Surgical pathology from this procedure confirmed ductal carcinoma in situ, grade 3, measuring 12 mm in extent.  Margins were free of neoplasm.  No invasive carcinoma identified.  Estrogen receptor was 60% positive and progesterone receptor was 70% positive.  She started menarche at age 89, and has had 2 children with her first child at age 33.  She went through menopause at age 73-50, and was not placed on hormones.  She does have psoriasis which is treated with injections through the dermatologist.  She completed adjuvant radiation with a 60 Gy course to the right breast in early June.  INTERVAL HISTORY:  Crystal Ramos is here for routine follow up and states that she has been well.  She was placed on chemoprevention with raloxifene in July 2021, and states that she has  tolerated this without difficulty, but ran out 2-3 weeks ago.  I will refill this today.  Annual mammogram from December 2021 was clear.  Bone density from July 2021 revealed osteopenia with a T-score of -1.6 of the right femur neck, previously -1.4.  Dual femur total mean is normal at -1.0, previously -0.4.  AP spine measures -1.1.  Her  appetite is good, but she has lost 9 pounds since her last visit.  She denies fever, chills or other signs of infection.  She denies nausea, vomiting, bowel issues, or abdominal pain.  She denies sore throat, cough, dyspnea, or chest pain.  REVIEW OF SYSTEMS:  Review of Systems  Constitutional: Negative.  Negative for appetite change, chills, fatigue, fever and unexpected weight change.  HENT:  Negative.    Eyes: Negative.   Respiratory: Negative.  Negative for chest tightness, cough, hemoptysis, shortness of breath and wheezing.   Cardiovascular: Negative.  Negative for chest pain, leg swelling and palpitations.  Gastrointestinal: Negative.  Negative for abdominal distention, abdominal pain, blood in stool, constipation, diarrhea, nausea and vomiting.  Endocrine: Negative.   Genitourinary: Negative.  Negative for difficulty urinating, dysuria, frequency and hematuria.   Musculoskeletal: Negative.  Negative for arthralgias, back pain, flank pain, gait problem and myalgias.  Skin: Negative.   Neurological: Negative.  Negative for dizziness, extremity weakness, gait problem, headaches, light-headedness, numbness, seizures and speech difficulty.  Hematological: Negative.   Psychiatric/Behavioral: Negative.  Negative for depression and sleep disturbance. The patient is not nervous/anxious.     VITALS:  There were no vitals  taken for this visit.  Wt Readings from Last 3 Encounters:  10/28/20 222 lb 12.8 oz (101.1 kg)  09/23/20 221 lb 6.4 oz (100.4 kg)  07/31/20 213 lb 3.2 oz (96.7 kg)    There is no height or weight on file to calculate BMI.  Performance  status (ECOG): 0 - Asymptomatic  PHYSICAL EXAM:  Physical Exam Constitutional:      General: She is not in acute distress.    Appearance: Normal appearance. She is normal weight.  HENT:     Head: Normocephalic and atraumatic.  Eyes:     General: No scleral icterus.    Extraocular Movements: Extraocular movements intact.     Conjunctiva/sclera: Conjunctivae normal.     Pupils: Pupils are equal, round, and reactive to light.  Cardiovascular:     Rate and Rhythm: Normal rate and regular rhythm.     Pulses: Normal pulses.     Heart sounds: Normal heart sounds. No murmur heard.   No friction rub. No gallop.  Pulmonary:     Effort: Pulmonary effort is normal. No respiratory distress.     Breath sounds: Normal breath sounds.  Chest:  Breasts:    Right: Normal.     Comments: Well healed right axillary scar.  She has a fading scar in the upper right breast which is well healed.  Both breasts are without masses. Abdominal:     General: Bowel sounds are normal. There is no distension.     Palpations: Abdomen is soft. There is no hepatomegaly, splenomegaly or mass.     Tenderness: There is no abdominal tenderness.  Musculoskeletal:        General: Normal range of motion.     Cervical back: Normal range of motion and neck supple.     Right lower leg: No edema.     Left lower leg: No edema.  Lymphadenopathy:     Cervical: No cervical adenopathy.  Skin:    General: Skin is warm and dry.  Neurological:     General: No focal deficit present.     Mental Status: She is alert and oriented to person, place, and time. Mental status is at baseline.  Psychiatric:        Mood and Affect: Mood normal.        Behavior: Behavior normal.        Thought Content: Thought content normal.        Judgment: Judgment normal.    LABS:   CBC Latest Ref Rng & Units 03/27/2019 06/07/2017 04/12/2017  WBC 3.4 - 10.8 x10E3/uL 6.1 5.7 7.8  Hemoglobin 11.1 - 15.9 g/dL 13.3 14.3 14.4  Hematocrit 34.0 - 46.6 %  41.9 41.8 43.4  Platelets 150 - 450 x10E3/uL 185 140(L) 178   CMP Latest Ref Rng & Units 10/28/2020 09/23/2020 03/27/2019  Glucose 65 - 99 mg/dL 100(H) 232(H) 81  BUN 8 - 27 mg/dL '17 16 14  '$ Creatinine 0.57 - 1.00 mg/dL 0.81 0.72 0.88  Sodium 134 - 144 mmol/L 140 141 140  Potassium 3.5 - 5.2 mmol/L 4.0 3.8 3.7  Chloride 96 - 106 mmol/L 103 103 100  CO2 20 - 29 mmol/L '24 24 26  '$ Calcium 8.7 - 10.3 mg/dL 8.6(L) 8.6(L) 8.8  Total Protein 6.0 - 8.5 g/dL 6.4 - -  Total Bilirubin 0.0 - 1.2 mg/dL 0.7 - -  Alkaline Phos 44 - 121 IU/L 92 - -  AST 0 - 40 IU/L 29 - -  ALT 0 - 32 IU/L  16 - -     STUDIES:   EXAM: 05/15/2020 DIGITAL DIAGNOSTIC BILATERAL MAMMOGRAM WITH TOMO AND CAD  COMPARISON:  Previous exam(s).  ACR Breast Density Category b: There are scattered areas of fibroglandular density.  FINDINGS: Right breast: A spot 2D magnification view of the lumpectomy site was performed in addition to standard views. There are expected postsurgical changes including fat necrosis. No suspicious mass, distortion, or microcalcifications are identified to suggest presence of malignancy.  Left breast: No suspicious mass, distortion, or microcalcifications are identified to suggest presence of malignancy.  Mammographic images were processed with CAD.  IMPRESSION: Expected postsurgical changes in the right breast. No mammographic evidence of malignancy bilaterally.   Allergies: No Known Allergies  Current Medications: Current Outpatient Medications  Medication Sig Dispense Refill   ALPRAZolam (XANAX) 0.25 MG tablet Take 0.25 mg by mouth at bedtime.      amiodarone (PACERONE) 200 MG tablet Take 0.5 tablets (100 mg total) by mouth daily. 45 tablet 3   aspirin EC 81 MG tablet Take 81 mg by mouth daily.     atorvastatin (LIPITOR) 80 MG tablet Take 1 tablet by mouth once daily 90 tablet 3   canagliflozin (INVOKANA) 300 MG TABS tablet Take 300 mg by mouth daily before breakfast.     EUTHYROX 88  MCG tablet Take 88 mcg by mouth daily.     furosemide (LASIX) 40 MG tablet Take 40 mg by mouth daily.     JARDIANCE 25 MG TABS tablet Take 25 mg by mouth daily.     meclizine (ANTIVERT) 25 MG tablet Take 25 mg by mouth 3 (three) times daily.      metFORMIN (GLUCOPHAGE) 850 MG tablet Take 850 mg by mouth 3 (three) times daily.     metoprolol succinate (TOPROL-XL) 50 MG 24 hr tablet Take 100 mg by mouth daily.     nitroGLYCERIN (NITROSTAT) 0.4 MG SL tablet Place 0.4 mg under the tongue every 5 (five) minutes as needed for chest pain.     raloxifene (EVISTA) 60 MG tablet Take 1 tablet by mouth once daily 90 tablet 3   rivaroxaban (XARELTO) 20 MG TABS tablet Take 20 mg by mouth daily with supper.     tiZANidine (ZANAFLEX) 4 MG tablet Take 4 mg by mouth 3 (three) times daily as needed for muscle spasms.     traMADol (ULTRAM) 50 MG tablet Take 50 mg by mouth 4 (four) times daily as needed for pain.     No current facility-administered medications for this visit.     ASSESSMENT & PLAN:   Assessment:   1.  Stage 0 ductal carcinoma in situ diagnosed in March 2021, treated with lumpectomy.  Estrogen and progesterone receptors were highly positive.  She completed adjuvant radiation in early June.  She was placed on chemoprevention with Raloxifene 60 mg daily in July 2021, and has tolerated this without difficulty.    2.  Psoriasis.  She was previously on injections every 3 months.  3.  Diabetes.  4.  Mild thrombocytopenia.  5.  Mild elevation of the liver transaminases.  She notes that Dr. Delena Bali has been monitoring this.  It may be due to medication and/or her diabetes.  6.  Mild osteopenia.  She will be due for repeat examination in July 2023.  7.  Low normal potassium.  I have given her a list of potassium rich foods to increase this through her diet.  8. Strong family history with 2 members on the paternal  side with breast cancer and she makes a 3rd.  She has been seen by [facility] to discuss  genetic testing.    Plan: She was placed on chemoprevention with raloxifene 60 mg daily in July 2021 and has tolerated this without difficulty.  As she ran out about 2-3 weeks ago, I will refill this today. We will plan to see her back in 5 months for examination.  If all is well at that time, we can even go to yearly follow up since Dr. Lilia Pro will be seeing her in the winter with mammography.  At a later time she does need to have a 1st colonoscopy.  She understands and agrees to this plan of care.  I have answered her questions and she knows to call with any concerns regarding her disease.  I provided 15 minutes of face-to-face time during this this encounter and > 50% was spent counseling as documented under my assessment and plan.    Melodye Ped, NP [facility] [address] [place] [address] Dept: [phone] Dept Fax: [phone]   I, Rita Ohara, am acting as scribe for Derwood Kaplan, MD  I have reviewed this report as typed by the medical scribe, and it is complete and accurate.  Melodye Ped, NP

## 2021-01-29 ENCOUNTER — Other Ambulatory Visit: Payer: Self-pay

## 2021-01-31 ENCOUNTER — Encounter: Payer: Self-pay | Admitting: Cardiology

## 2021-01-31 ENCOUNTER — Other Ambulatory Visit: Payer: Self-pay

## 2021-01-31 ENCOUNTER — Ambulatory Visit (INDEPENDENT_AMBULATORY_CARE_PROVIDER_SITE_OTHER): Payer: PPO | Admitting: Cardiology

## 2021-01-31 VITALS — BP 130/78 | HR 64 | Ht 61.0 in | Wt 216.4 lb

## 2021-01-31 DIAGNOSIS — I10 Essential (primary) hypertension: Secondary | ICD-10-CM | POA: Diagnosis not present

## 2021-01-31 DIAGNOSIS — I2510 Atherosclerotic heart disease of native coronary artery without angina pectoris: Secondary | ICD-10-CM

## 2021-01-31 DIAGNOSIS — E119 Type 2 diabetes mellitus without complications: Secondary | ICD-10-CM

## 2021-01-31 DIAGNOSIS — M7989 Other specified soft tissue disorders: Secondary | ICD-10-CM

## 2021-01-31 DIAGNOSIS — I480 Paroxysmal atrial fibrillation: Secondary | ICD-10-CM

## 2021-01-31 DIAGNOSIS — I7090 Unspecified atherosclerosis: Secondary | ICD-10-CM | POA: Diagnosis not present

## 2021-01-31 NOTE — Progress Notes (Signed)
Cardiology Office Note:    Date:  01/31/2021   ID:  Crystal Ramos, DOB [birth year], MRN [id number]  PCP:  Crystal Dress, MD  Cardiologist:  Crystal Lindau, MD   Referring MD: Crystal Dress, MD    ASSESSMENT:    1. Essential hypertension   2. Paroxysmal atrial fibrillation (HCC)   3. Atherosclerotic vascular disease   4. Coronary artery disease involving native coronary artery of native heart without angina pectoris   5. Type 2 diabetes mellitus without complication, without long-term current use of insulin ([place])   6. Morbid obesity ([place])    PLAN:    In order of problems listed above:  Coronary artery disease: Secondary prevention stressed with the patient.  Importance of compliance with diet medication stressed and she vocalized understanding.  She was advised to walk on a regular basis to the best of her ability. Bilateral pedal edema: This is longstanding.  Telemetry.  Greater than right she says its been persisting for several months.  I will just do a DVT study to rule out any chronic venous thrombosis issues.  She has no pain or any erythema or suggestions of an acute process here. Paroxysmal atrial fibrillation:I discussed with the patient atrial fibrillation, disease process. Management and therapy including rate and rhythm control, anticoagulation benefits and potential risks were discussed extensively with the patient. Patient had multiple questions which were answered to patient's satisfaction.  Benefits and potential risks of amiodarone discussed and she vocalized understanding and questions were answered to her satisfaction. Essential hypertension: Blood pressure stable and diet was emphasized.  Lifestyle modification urged. Mixed dyslipidemia, diabetes mellitus and morbid obesity: I discussed this with her at length.  Lifestyle modification urged weight reduction stressed she promises to do better.  Risks of obesity explained.  Diabetes mellitus numbers were  explained.  Hemoglobin A1c is markedly elevated. Patient will be seen in follow-up appointment in 6 months or earlier if the patient has any concerns    Medication Adjustments/Labs and Tests Ordered: Current medicines are reviewed at length with the patient today.  Concerns regarding medicines are outlined above.  No orders of the defined types were placed in this encounter.  No orders of the defined types were placed in this encounter.    No chief complaint on file.    History of Present Illness:    Crystal Ramos is a 65 y.o. female.  Patient has past medical history of atherosclerotic vascular disease, essential hypertension, dyslipidemia, diabetes mellitus and morbid obesity.  She denies any problems at this time and takes care of activities of daily living.  She has longstanding pedal edema for the past several months.  No chest pain orthopnea shortness of breath or PND.  At the time of my evaluation, the patient is alert awake oriented and in no distress.  Past Medical History:  Diagnosis Date   Acute diastolic heart failure (HCC)    Angina pectoris ([place]) 03/27/2019   Atherosclerotic vascular disease 02/25/2017   Atrial fibrillation (HCC)    Bilateral carotid bruits 01/05/2019   Cardiac murmur 03/18/2020   Chest pain    Chronic diastolic heart failure ([place]) 03/08/2019   Coronary artery disease involving native coronary artery of native heart without angina pectoris    Diabetes mellitus due to underlying condition with unspecified complications ([place]) [id number]   Ductal carcinoma in situ (DCIS) of right breast with comedonecrosis 08/01/2019   Dyslipidemia 02/25/2017   Dyspnea on exertion 12/18/2015   Essential  hypertension    Hematuria 11/29/2017   Morbid obesity ([place]) 02/25/2017   Osteopenia after menopause 12/01/2019   Paroxysmal atrial fibrillation ([place]) 02/25/2017   Psoriasis 07/31/2020   Type 2 diabetes mellitus without complication ([place]) 99991111    Past Surgical  History:  Procedure Laterality Date   CHOLECYSTECTOMY     LEFT HEART CATH AND CORONARY ANGIOGRAPHY N/A 04/03/2019   Procedure: LEFT HEART CATH AND CORONARY ANGIOGRAPHY;  Surgeon: Burnell Blanks, MD;  Location: [facility];  Service: Cardiovascular;  Laterality: N/A;   MEDIAL PARTIAL KNEE REPLACEMENT      Current Medications: Current Meds  Medication Sig   ALPRAZolam (XANAX) 0.25 MG tablet Take 0.25 mg by mouth at bedtime.    amiodarone (PACERONE) 200 MG tablet Take 0.5 tablets (100 mg total) by mouth daily.   aspirin EC 81 MG tablet Take 81 mg by mouth daily.   atorvastatin (LIPITOR) 80 MG tablet Take 1 tablet by mouth once daily   canagliflozin (INVOKANA) 300 MG TABS tablet Take 300 mg by mouth daily before breakfast.   EUTHYROX 88 MCG tablet Take 88 mcg by mouth daily.   Ferrous Sulfate (IRON) 325 (65 Fe) MG TABS Take 1 tablet by mouth daily.   furosemide (LASIX) 40 MG tablet Take 40 mg by mouth daily.   isosorbide mononitrate (IMDUR) 30 MG 24 hr tablet Take 30 mg by mouth daily.   JARDIANCE 25 MG TABS tablet Take 25 mg by mouth daily.   meclizine (ANTIVERT) 25 MG tablet Take 25 mg by mouth 3 (three) times daily.    metFORMIN (GLUCOPHAGE) 850 MG tablet Take 850 mg by mouth 3 (three) times daily.   metoprolol succinate (TOPROL-XL) 50 MG 24 hr tablet Take 100 mg by mouth daily.   nitroGLYCERIN (NITROSTAT) 0.4 MG SL tablet Place 0.4 mg under the tongue every 5 (five) minutes as needed for chest pain.   raloxifene (EVISTA) 60 MG tablet Take 1 tablet by mouth once daily   rivaroxaban (XARELTO) 20 MG TABS tablet Take 20 mg by mouth daily with supper.   tiZANidine (ZANAFLEX) 4 MG tablet Take 4 mg by mouth 3 (three) times daily as needed for muscle spasms.   traMADol (ULTRAM) 50 MG tablet Take 50 mg by mouth 4 (four) times daily as needed for pain.     Allergies:   Patient has no known allergies.   Social History   Socioeconomic History   Marital status: Married     Spouse name: Not on file   Number of children: Not on file   Years of education: Not on file   Highest education level: Not on file  Occupational History   Not on file  Tobacco Use   Smoking status: Passive Smoke Exposure - Never Smoker   Smokeless tobacco: Never   Tobacco comments:    "whole family" smoked in the house  Vaping Use   Vaping Use: Never used  Substance and Sexual Activity   Alcohol use: No   Drug use: No   Sexual activity: Not on file  Other Topics Concern   Not on file  Social History Narrative   Not on file   Social Determinants of Health   Financial Resource Strain: Not on file  Food Insecurity: Not on file  Transportation Needs: Not on file  Physical Activity: Not on file  Stress: Not on file  Social Connections: Not on file     Family History: The patient's family history includes Breast cancer in  her paternal aunt; Diabetes in her mother; Stroke in her father and paternal aunt.  ROS:   Please see the history of present illness.    All other systems reviewed and are negative.  EKGs/Labs/Other Studies Reviewed:    The following studies were reviewed today: I discussed my findings with the patient at length.  EKG reveals sinus rhythm and nonspecific ST-T changes   Recent Labs: 10/28/2020: ALT 16; BUN 17; Creatinine, Ser 0.81; Potassium 4.0; Sodium 140  Recent Lipid Panel    Component Value Date/Time   CHOL 107 10/28/2020 1030   TRIG 79 10/28/2020 1030   HDL 39 (L) 10/28/2020 1030   CHOLHDL 2.7 10/28/2020 1030   LDLCALC 52 10/28/2020 1030    Physical Exam:    VS:  BP 130/78   Pulse 64   Ht '5\' 1"'$  (1.549 m)   Wt 216 lb 6.4 oz (98.2 kg)   SpO2 97%   BMI 40.89 kg/m     Wt Readings from Last 3 Encounters:  01/31/21 216 lb 6.4 oz (98.2 kg)  10/28/20 222 lb 12.8 oz (101.1 kg)  09/23/20 221 lb 6.4 oz (100.4 kg)     GEN: Patient is in no acute distress HEENT: Normal NECK: No JVD; No carotid bruits LYMPHATICS: No  lymphadenopathy CARDIAC: Hear sounds regular, 2/6 systolic murmur at the apex. RESPIRATORY:  Clear to auscultation without rales, wheezing or rhonchi  ABDOMEN: Soft, non-tender, non-distended MUSCULOSKELETAL: Bilateral left greater than right.  Edema; No deformity  SKIN: Warm and dry NEUROLOGIC:  Alert and oriented x 3 PSYCHIATRIC:  Normal affect   Signed, Crystal Lindau, MD  01/31/2021 8:54 AM    [place]

## 2021-01-31 NOTE — Patient Instructions (Signed)
Medication Instructions:  No medication changes. *If you need a refill on your cardiac medications before your next appointment, please call your pharmacy*   Lab Work: None ordered If you have labs (blood work) drawn today and your tests are completely normal, you will receive your results only by: [place] (if you have MyChart) OR A paper copy in the mail If you have any lab test that is abnormal or we need to change your treatment, we will call you to review the results.   Testing/Procedures: Your physician has requested that you have a lower extremity venous duplex. This test is an ultrasound of the veins in the legs. It looks at venous blood flow that carries blood from the heart to the legs or arms. Allow one hour for a Lower Venous exam. There are no restrictions or special instructions.     Follow-Up: At [facility], you and your health needs are our priority.  As part of our continuing mission to provide you with exceptional heart care, we have created designated Provider Care Teams.  These Care Teams include your primary Cardiologist (physician) and Advanced Practice Providers (APPs -  Physician Assistants and Nurse Practitioners) who all work together to provide you with the care you need, when you need it.  We recommend signing up for the patient portal called "MyChart".  Sign up information is provided on this After Visit Summary.  MyChart is used to connect with patients for Virtual Visits (Telemedicine).  Patients are able to view lab/test results, encounter notes, upcoming appointments, etc.  Non-urgent messages can be sent to your provider as well.   To learn more about what you can do with MyChart, go to [email or web address].    Your next appointment:   6 month(s)  The format for your next appointment:   In Person  Provider:   Jyl Heinz, MD   Other Instructions NA

## 2021-02-03 ENCOUNTER — Ambulatory Visit (INDEPENDENT_AMBULATORY_CARE_PROVIDER_SITE_OTHER): Payer: PPO

## 2021-02-03 ENCOUNTER — Other Ambulatory Visit: Payer: Self-pay

## 2021-02-03 DIAGNOSIS — M7989 Other specified soft tissue disorders: Secondary | ICD-10-CM | POA: Diagnosis not present

## 2021-02-04 ENCOUNTER — Telehealth: Payer: Self-pay

## 2021-02-04 ENCOUNTER — Telehealth: Payer: Self-pay | Admitting: Hematology and Oncology

## 2021-02-04 NOTE — Telephone Encounter (Signed)
-----   Message from Jenean Lindau, MD sent at 02/04/2021  4:15 PM EDT ----- The results of the study is unremarkable. Please inform patient. I will discuss in detail at next appointment. Cc  primary care/referring physician Jenean Lindau, MD 02/04/2021 4:15 PM

## 2021-02-04 NOTE — Telephone Encounter (Signed)
Patient called to rescheduled No Show in August to 9/27 9:30 am

## 2021-02-04 NOTE — Telephone Encounter (Signed)
Called patient. Crystal Ramos  made aware of results. Verbalized understanding. No questions or concerns expressed at this time. Results forward to PCP

## 2021-02-10 ENCOUNTER — Ambulatory Visit: Payer: PPO | Admitting: Hematology and Oncology

## 2021-02-11 ENCOUNTER — Encounter: Payer: Self-pay | Admitting: Hematology and Oncology

## 2021-02-11 ENCOUNTER — Inpatient Hospital Stay: Payer: PPO | Attending: Oncology | Admitting: Hematology and Oncology

## 2021-02-11 DIAGNOSIS — D0511 Intraductal carcinoma in situ of right breast: Secondary | ICD-10-CM

## 2021-02-11 NOTE — Assessment & Plan Note (Addendum)
No new findings to suggest recurrence of disease. Mammogram from earlier this year is benign. She continues daily Raloxifene without difficulty. She is scheduled for repeat mammogram January 2023 and follow up with Dr. Lilia Pro. We will see her back in June for repeat evaluation.

## 2021-02-11 NOTE — Progress Notes (Signed)
[name]  [address] 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 Mammogram from earlier this year is benign. She continues daily Raloxifene without difficulty. She is scheduled for repeat mammogram January 2023 and follow up with Dr. Lilia Pro. We will see her back in June for repeat evaluation.    The patient understands the plans discussed today and is in agreement with them.  She knows to contact our office if she develops concerns prior to her next appointment.    Melodye Ped, NP  [facility] [address] [place] [address] Dept: [phone] Dept Fax: [phone]   No orders of the defined types were placed in this encounter.     CHIEF COMPLAINT:  CC: A 65 year old with history of ductal carcinoma in situ of right breast with comedonecrosis here for evaluation.  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   08/01/2019 Cancer Staging   Staging form: Breast, AJCC 8th Edition - Clinical stage from 08/01/2019: Stage 0 (cTis (DCIS), cN0, cM0, G3, ER+, PR+, HER2: Not Assessed) - Signed by Derwood Kaplan, MD on 07/31/2020 Histopathologic type: Intraductal carcinoma, noninfiltrating, NOS Method of lymph node assessment: Clinical Nuclear grade: G3 Multigene prognostic tests performed: None Histologic grading system: 3 grade system Menopausal status: Postmenopausal Stage used in treatment planning: Yes National guidelines used  in treatment planning: Yes Type of national guideline used in treatment planning: NCCN Staging comments: 12 mm, lumpectomy and XRT, chemoprevention with raloxifene      INTERVAL HISTORY:  Crystal Ramos is here today for repeat clinical assessment. She has been well since last visit with no new issues concerning her breasts. She is tolerating Raloxifene daily without concerns. She denies fever, chills, nausea or vomiting. She denies shortness of breath, chest pain or cough. She denies issue with bowel or bladder.   REVIEW OF SYSTEMS:  Review of Systems  Constitutional:  Negative for appetite change, chills, diaphoresis, fatigue, fever and unexpected weight change.  HENT:   Negative for hearing loss, lump/mass, mouth sores, nosebleeds, sore throat, tinnitus, trouble swallowing and voice change.   Eyes:  Negative for eye problems and icterus.  Respiratory:  Negative for chest tightness, cough, hemoptysis, shortness of breath and wheezing.   Cardiovascular:  Negative for chest pain, leg swelling and palpitations.  Gastrointestinal:  Negative for abdominal distention, abdominal pain, blood in stool, constipation, diarrhea, nausea, rectal pain and vomiting.  Endocrine: Negative for hot flashes.  Genitourinary:  Negative for bladder incontinence, difficulty urinating, dyspareunia, dysuria, frequency, hematuria and nocturia.   Musculoskeletal:  Negative for arthralgias, back pain, flank pain, gait problem, myalgias, neck pain and neck stiffness.  Skin:  Negative for itching, rash and wound.  Neurological:  Negative for dizziness, extremity weakness, gait problem, headaches, light-headedness, numbness, seizures and speech difficulty.  Hematological:  Negative for adenopathy. Does not bruise/bleed easily.  Psychiatric/Behavioral:  Negative for confusion, decreased concentration, depression, sleep disturbance and suicidal ideas. The patient is not nervous/anxious.     VITALS:  Blood pressure Marland Kitchen)  141/63,  pulse 60, temperature 98.3 F (36.8 C), temperature source Oral, resp. rate 20, height $RemoveBe'5\' 1"'FehVISXZb$  (1.549 m), weight 216 lb (98 kg), SpO2 95 %.  Wt Readings from Last 3 Encounters:  02/11/21 216 lb (98 kg)  01/31/21 216 lb 6.4 oz (98.2 kg)  10/28/20 222 lb 12.8 oz (101.1 kg)    Body mass index is 40.81 kg/m.  Performance status (ECOG): 1 - Symptomatic but completely ambulatory  PHYSICAL EXAM:  Physical Exam Constitutional:      General: She is not in acute distress.    Appearance: Normal appearance. She is normal weight. She is not ill-appearing, toxic-appearing or diaphoretic.  HENT:     Head: Normocephalic and atraumatic.     Nose: Nose normal. No congestion or rhinorrhea.     Mouth/Throat:     Mouth: Mucous membranes are moist.     Pharynx: Oropharynx is clear. No oropharyngeal exudate or posterior oropharyngeal erythema.  Eyes:     General: No scleral icterus.       Right eye: No discharge.        Left eye: No discharge.     Extraocular Movements: Extraocular movements intact.     Conjunctiva/sclera: Conjunctivae normal.     Pupils: Pupils are equal, round, and reactive to light.  Neck:     Vascular: No carotid bruit.  Cardiovascular:     Rate and Rhythm: Normal rate and regular rhythm.     Heart sounds: No murmur heard.   No friction rub. No gallop.  Pulmonary:     Effort: Pulmonary effort is normal. No respiratory distress.     Breath sounds: Normal breath sounds. No stridor. No wheezing, rhonchi or rales.  Chest:     Chest wall: No mass, lacerations, deformity, swelling, tenderness, crepitus or edema. There is no dullness to percussion.  Breasts:    Breasts are symmetrical.     Right: Normal. No swelling, bleeding, inverted nipple, mass, nipple discharge, skin change or tenderness.     Left: Normal. No swelling, bleeding, inverted nipple, mass, nipple discharge, skin change or tenderness.  Abdominal:     General: Abdomen is flat. Bowel sounds are normal. There is no  distension.     Palpations: There is no mass.     Tenderness: There is no abdominal tenderness. There is no right CVA tenderness, left CVA tenderness, guarding or rebound.     Hernia: No hernia is present.  Musculoskeletal:        General: No swelling, tenderness, deformity or signs of injury. Normal range of motion.     Cervical back: Normal range of motion and neck supple. No rigidity or tenderness.     Right lower leg: No edema.     Left lower leg: No edema.  Lymphadenopathy:     Cervical: No cervical adenopathy.     Upper Body:     Right upper body: No supraclavicular, axillary or pectoral adenopathy.     Left upper body: No supraclavicular, axillary or pectoral adenopathy.  Skin:    General: Skin is warm and dry.     Capillary Refill: Capillary refill takes less than 2 seconds.     Coloration: Skin is not jaundiced or pale.     Findings: No bruising, erythema, lesion or rash.  Neurological:     General: No focal deficit present.     Mental Status: She is alert and oriented to person, place, and time. Mental status is at baseline.     Cranial  Nerves: No cranial nerve deficit.     Sensory: No sensory deficit.     Motor: No weakness.     Coordination: Coordination normal.     Gait: Gait normal.     Deep Tendon Reflexes: Reflexes normal.  Psychiatric:        Mood and Affect: Mood normal.        Behavior: Behavior normal.        Thought Content: Thought content normal.        Judgment: Judgment normal.    LABS:   CBC Latest Ref Rng & Units 03/27/2019 06/07/2017 04/12/2017  WBC 3.4 - 10.8 x10E3/uL 6.1 5.7 7.8  Hemoglobin 11.1 - 15.9 g/dL 13.3 14.3 14.4  Hematocrit 34.0 - 46.6 % 41.9 41.8 43.4  Platelets 150 - 450 x10E3/uL 185 140(L) 178   CMP Latest Ref Rng & Units 10/28/2020 09/23/2020 03/27/2019  Glucose 65 - 99 mg/dL 100(H) 232(H) 81  BUN 8 - 27 mg/dL $Remove'17 16 14  'QTVwcUc$ Creatinine 0.57 - 1.00 mg/dL 0.81 0.72 0.88  Sodium 134 - 144 mmol/L 140 141 140  Potassium 3.5 - 5.2 mmol/L 4.0  3.8 3.7  Chloride 96 - 106 mmol/L 103 103 100  CO2 20 - 29 mmol/L $RemoveB'24 24 26  'oUksptNH$ Calcium 8.7 - 10.3 mg/dL 8.6(L) 8.6(L) 8.8  Total Protein 6.0 - 8.5 g/dL 6.4 - -  Total Bilirubin 0.0 - 1.2 mg/dL 0.7 - -  Alkaline Phos 44 - 121 IU/L 92 - -  AST 0 - 40 IU/L 29 - -  ALT 0 - 32 IU/L 16 - -     No results found for: CEA1 / No results found for: CEA1 No results found for: PSA1 No results found for: MBE675 No results found for: QGB201  No results found for: TOTALPROTELP, ALBUMINELP, A1GS, A2GS, BETS, BETA2SER, GAMS, MSPIKE, SPEI No results found for: TIBC, FERRITIN, IRONPCTSAT No results found for: LDH  STUDIES:  VAS [place] LOWER EXTREMITY VENOUS (DVT)  Result Date: 02/04/2021  Lower Venous DVT Study Patient Name:  Margaretta ANN COX Schrodt  Date of Exam:   02/03/2021 Medical Rec #: [id number]             Accession #:    [id number] Date of Birth: Mar 07, 1956             Patient Gender: F Patient Age:   39 years Exam Location:  [facility] Procedure:      VAS [place] LOWER EXTREMITY VENOUS (DVT) Referring Phys: Jyl Heinz --------------------------------------------------------------------------------  Indications: Swelling/edema of a limb M79.89.  Performing Technologist: Luane School RDCS  Examination Guidelines: A complete evaluation includes B-mode imaging, spectral Doppler, color Doppler, and power Doppler as needed of all accessible portions of each vessel. Bilateral testing is considered an integral part of a complete examination. Limited examinations for reoccurring indications may be performed as noted. The reflux portion of the exam is performed with the patient in reverse Trendelenburg.  +---------+---------------+---------+-----------+----------+--------------+ RIGHT    CompressibilityPhasicitySpontaneityPropertiesThrombus Aging +---------+---------------+---------+-----------+----------+--------------+ CFV      Full           Yes      Yes                                  +---------+---------------+---------+-----------+----------+--------------+ SFJ      Full                                                        +---------+---------------+---------+-----------+----------+--------------+  FV Prox  Full                                                        +---------+---------------+---------+-----------+----------+--------------+ FV Mid   Full                                                        +---------+---------------+---------+-----------+----------+--------------+ FV DistalFull                                                        +---------+---------------+---------+-----------+----------+--------------+ PFV      Full                                                        +---------+---------------+---------+-----------+----------+--------------+ POP      Full           Yes      Yes                                 +---------+---------------+---------+-----------+----------+--------------+ PTV      Full                                                        +---------+---------------+---------+-----------+----------+--------------+ PERO     Full                                                        +---------+---------------+---------+-----------+----------+--------------+   +---------+---------------+---------+-----------+----------+--------------+ LEFT     CompressibilityPhasicitySpontaneityPropertiesThrombus Aging +---------+---------------+---------+-----------+----------+--------------+ CFV      Full           Yes      Yes                                 +---------+---------------+---------+-----------+----------+--------------+ SFJ      Full                                                        +---------+---------------+---------+-----------+----------+--------------+ FV Prox  Full                                                         +---------+---------------+---------+-----------+----------+--------------+  FV Mid   Full                                                        +---------+---------------+---------+-----------+----------+--------------+ FV DistalFull                                                        +---------+---------------+---------+-----------+----------+--------------+ PFV      Full                                                        +---------+---------------+---------+-----------+----------+--------------+ POP      Full           Yes      Yes                                 +---------+---------------+---------+-----------+----------+--------------+ PTV      Full                                                        +---------+---------------+---------+-----------+----------+--------------+ PERO     Full                                                        +---------+---------------+---------+-----------+----------+--------------+     Summary: BILATERAL: - No evidence of deep vein thrombosis seen in the lower extremities, bilaterally. -   *See table(s) above for measurements and observations. Electronically signed by Jenne Campus MD on 02/04/2021 at 1:52:30 PM.    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 08/01/2019   Dyslipidemia 02/25/2017   Dyspnea on exertion 12/18/2015   Essential hypertension     Hematuria 11/29/2017   Morbid obesity ([facility]) 02/25/2017   Osteopenia after menopause 12/01/2019   Paroxysmal atrial fibrillation ([facility]) 02/25/2017   Psoriasis 07/31/2020   Type 2 diabetes mellitus without complication ([facility]) 01/19/7653    Past Surgical History:  Procedure Laterality Date   CHOLECYSTECTOMY     LEFT HEART CATH AND CORONARY ANGIOGRAPHY N/A 04/03/2019   Procedure: LEFT HEART CATH AND  CORONARY ANGIOGRAPHY;  Surgeon: Burnell Blanks, MD;  Location: [facility];  Service: Cardiovascular;  Laterality: N/A;   MEDIAL PARTIAL KNEE REPLACEMENT      Family History  Problem Relation Age of Onset   Diabetes Mother    Stroke Father    Stroke Paternal Aunt    Breast cancer Paternal Aunt     Social History:  reports that she has never smoked. She has been exposed to tobacco smoke. She has never used smokeless tobacco. She reports that she does not drink alcohol and does not use drugs.The patient is alone  today.  Allergies: No Known Allergies  Current Medications: Current Outpatient Medications  Medication Sig Dispense Refill   ALPRAZolam (XANAX) 0.25 MG tablet Take 0.25 mg by mouth at bedtime.      amiodarone (PACERONE) 200 MG tablet Take 0.5 tablets (100 mg total) by mouth daily. 45 tablet 3   aspirin EC 81 MG tablet Take 81 mg by mouth daily.     atorvastatin (LIPITOR) 80 MG tablet Take 1 tablet by mouth once daily 90 tablet 3   canagliflozin (INVOKANA) 300 MG TABS tablet Take 300 mg by mouth daily before breakfast.     EUTHYROX 88 MCG tablet Take 88 mcg by mouth daily.     Ferrous Sulfate (IRON) 325 (65 Fe) MG TABS Take 1 tablet by mouth daily.     furosemide (LASIX) 40 MG tablet Take 40 mg by mouth daily.     isosorbide mononitrate (IMDUR) 30 MG 24 hr tablet Take 30 mg by mouth daily.     JARDIANCE 25 MG TABS tablet Take 25 mg by mouth daily.     meclizine (ANTIVERT) 25 MG tablet Take 25 mg by mouth 3 (three) times daily.      metFORMIN (GLUCOPHAGE) 850 MG tablet  Take 850 mg by mouth 3 (three) times daily.     metoprolol succinate (TOPROL-XL) 50 MG 24 hr tablet Take 100 mg by mouth daily.     nitroGLYCERIN (NITROSTAT) 0.4 MG SL tablet Place 0.4 mg under the tongue every 5 (five) minutes as needed for chest pain.     raloxifene (EVISTA) 60 MG tablet Take 1 tablet by mouth once daily 90 tablet 3   rivaroxaban (XARELTO) 20 MG TABS tablet Take 20 mg by mouth daily with supper.     tiZANidine (ZANAFLEX) 4 MG tablet Take 4 mg by mouth 3 (three) times daily as needed for muscle spasms.     traMADol (ULTRAM) 50 MG tablet Take 50 mg by mouth 4 (four) times daily as needed for pain.     No current facility-administered medications for this visit.

## 2021-02-28 DIAGNOSIS — Z23 Encounter for immunization: Secondary | ICD-10-CM | POA: Diagnosis not present

## 2021-02-28 DIAGNOSIS — Z139 Encounter for screening, unspecified: Secondary | ICD-10-CM | POA: Diagnosis not present

## 2021-02-28 DIAGNOSIS — E785 Hyperlipidemia, unspecified: Secondary | ICD-10-CM | POA: Diagnosis not present

## 2021-02-28 DIAGNOSIS — I480 Paroxysmal atrial fibrillation: Secondary | ICD-10-CM | POA: Diagnosis not present

## 2021-02-28 DIAGNOSIS — D509 Iron deficiency anemia, unspecified: Secondary | ICD-10-CM | POA: Diagnosis not present

## 2021-02-28 DIAGNOSIS — E039 Hypothyroidism, unspecified: Secondary | ICD-10-CM | POA: Diagnosis not present

## 2021-02-28 DIAGNOSIS — Z6841 Body Mass Index (BMI) 40.0 and over, adult: Secondary | ICD-10-CM | POA: Diagnosis not present

## 2021-02-28 DIAGNOSIS — E1169 Type 2 diabetes mellitus with other specified complication: Secondary | ICD-10-CM | POA: Diagnosis not present

## 2021-02-28 DIAGNOSIS — I5032 Chronic diastolic (congestive) heart failure: Secondary | ICD-10-CM | POA: Diagnosis not present

## 2021-02-28 DIAGNOSIS — I10 Essential (primary) hypertension: Secondary | ICD-10-CM | POA: Diagnosis not present

## 2021-03-03 DIAGNOSIS — E1169 Type 2 diabetes mellitus with other specified complication: Secondary | ICD-10-CM | POA: Diagnosis not present

## 2021-03-15 DIAGNOSIS — M79604 Pain in right leg: Secondary | ICD-10-CM | POA: Diagnosis not present

## 2021-03-15 DIAGNOSIS — M25511 Pain in right shoulder: Secondary | ICD-10-CM | POA: Diagnosis not present

## 2021-03-19 ENCOUNTER — Telehealth: Payer: Self-pay

## 2021-03-19 DIAGNOSIS — S42201A Unspecified fracture of upper end of right humerus, initial encounter for closed fracture: Secondary | ICD-10-CM | POA: Diagnosis not present

## 2021-03-19 NOTE — Telephone Encounter (Signed)
   [facility] Pre-operative Risk Assessment    Patient Name: Crystal Ramos  DOB: 10/13/1955 MRN: [id number]  HEARTCARE STAFF:  - IMPORTANT!!!!!! Under Visit Info/Reason for Call, type in Other and utilize the format Clearance MM/DD/YY or Clearance TBD. Do not use dashes or single digits. - Please review there is not already an duplicate clearance open for this procedure. - If request is for dental extraction, please clarify the # of teeth to be extracted. - If the patient is currently at the dentist's office, call Pre-Op Callback Staff (MA/nurse) to input urgent request.  - If the patient is not currently in the dentist office, please route to the Pre-Op pool.  Request for surgical clearance:  What type of surgery is being performed? Right proximal humerus open reduction internal fixation  When is this surgery scheduled? TBD  What type of clearance is required (medical clearance vs. Pharmacy clearance to hold med vs. Both)? Medical  Are there any medications that need to be held prior to surgery and how long?   Practice name and name of physician performing surgery? [facility]- Dr. Joya Salm  What is the office phone number? [phone]   7.   What is the office fax number? [phone]  8.   Anesthesia type (None, local, MAC, general) ? General   Kainen Struckman Tressa Busman 03/19/2021, 11:05 AM  _________________________________________________________________   (provider comments below)

## 2021-03-19 NOTE — Telephone Encounter (Addendum)
   Name: Crystal Ramos  DOB: April 30, 1956  MRN: [id number]   Primary Cardiologist: Jenean Lindau, MD  Chart reviewed as part of pre-operative protocol coverage. Patient was contacted 03/19/2021 in reference to pre-operative risk assessment for pending surgery as outlined below.  Crystal Ramos was last seen on 01/31/21 by Dr. Geraldo Pitter.  Since that day, Crystal Ramos has done well. She has a history of PCI in 2020 and has been stable since that time. She had a reassuring nuclear stress test in 07/2020. She became lightheaded and fell breaking her shoulder. She denies angina.   Per office protocol, patient can hold Xarelto for 2 days prior to procedure.  Therefore, based on ACC/AHA guidelines, the patient would be at acceptable risk for the planned procedure without further cardiovascular testing.   The patient was advised that if she develops new symptoms prior to surgery to contact our office to arrange for a follow-up visit, and she verbalized understanding.  I will route this recommendation to the requesting party via Epic fax function and remove from pre-op pool. Please call with questions.  Tami Lin Kiwanna Spraker, PA 03/19/2021, 11:40 AM

## 2021-03-19 NOTE — Telephone Encounter (Signed)
Patient with diagnosis of afib on Xarelto for anticoagulation.    Procedure:  Right proximal humerus open reduction internal fixation Date of procedure: TBD   CHA2DS2-VASc Score = 6   This indicates a 9.7% annual risk of stroke. The patient's score is based upon: CHF History: 1 HTN History: 1 Diabetes History: 1 Stroke History: 0 Vascular Disease History: 1 Age Score: 1 Gender Score: 1     CrCl 72.2 ml/min  Per office protocol, patient can hold Xarelto for 2 days prior to procedure.

## 2021-03-19 NOTE — Telephone Encounter (Signed)
S/w pt's daughter Janett Billow [facility]) in regard to instructions for Xarelto and upcoming surgery for the pt. Pt will need to hold Xarelto x 2 days prior to surgery and will resume once surgeon feels safe to resume post procedure.    I will send a message to Dr. Julien Nordmann nurse to reach out to the pt with an appt possibly sooner than 07/2021 appt; Per pre op provider pt to follow up with cardiologist sometime post surgery as to pt c/o dizziness, see notes from pre op provider.

## 2021-03-20 DIAGNOSIS — S42211A Unspecified displaced fracture of surgical neck of right humerus, initial encounter for closed fracture: Secondary | ICD-10-CM | POA: Diagnosis not present

## 2021-03-20 DIAGNOSIS — S42254A Nondisplaced fracture of greater tuberosity of right humerus, initial encounter for closed fracture: Secondary | ICD-10-CM | POA: Diagnosis not present

## 2021-03-20 DIAGNOSIS — S42201A Unspecified fracture of upper end of right humerus, initial encounter for closed fracture: Secondary | ICD-10-CM | POA: Diagnosis not present

## 2021-03-24 DIAGNOSIS — S42201A Unspecified fracture of upper end of right humerus, initial encounter for closed fracture: Secondary | ICD-10-CM | POA: Diagnosis not present

## 2021-04-02 DIAGNOSIS — S42201A Unspecified fracture of upper end of right humerus, initial encounter for closed fracture: Secondary | ICD-10-CM | POA: Diagnosis not present

## 2021-04-08 DIAGNOSIS — M25511 Pain in right shoulder: Secondary | ICD-10-CM | POA: Diagnosis not present

## 2021-04-16 DIAGNOSIS — S42201A Unspecified fracture of upper end of right humerus, initial encounter for closed fracture: Secondary | ICD-10-CM | POA: Diagnosis not present

## 2021-04-16 DIAGNOSIS — M25511 Pain in right shoulder: Secondary | ICD-10-CM | POA: Diagnosis not present

## 2021-04-23 DIAGNOSIS — S42201A Unspecified fracture of upper end of right humerus, initial encounter for closed fracture: Secondary | ICD-10-CM | POA: Diagnosis not present

## 2021-04-24 DIAGNOSIS — M25511 Pain in right shoulder: Secondary | ICD-10-CM | POA: Diagnosis not present

## 2021-04-24 DIAGNOSIS — S42201A Unspecified fracture of upper end of right humerus, initial encounter for closed fracture: Secondary | ICD-10-CM | POA: Diagnosis not present

## 2021-04-24 NOTE — Telephone Encounter (Signed)
Pt declined making an appointment at this time.

## 2021-04-24 NOTE — Telephone Encounter (Signed)
Called pt but no answer or VM.

## 2021-04-25 DIAGNOSIS — M47816 Spondylosis without myelopathy or radiculopathy, lumbar region: Secondary | ICD-10-CM | POA: Diagnosis not present

## 2021-04-25 DIAGNOSIS — M545 Low back pain, unspecified: Secondary | ICD-10-CM | POA: Diagnosis not present

## 2021-04-25 DIAGNOSIS — G8911 Acute pain due to trauma: Secondary | ICD-10-CM | POA: Diagnosis not present

## 2021-04-29 DIAGNOSIS — S42201A Unspecified fracture of upper end of right humerus, initial encounter for closed fracture: Secondary | ICD-10-CM | POA: Diagnosis not present

## 2021-04-29 DIAGNOSIS — M25511 Pain in right shoulder: Secondary | ICD-10-CM | POA: Diagnosis not present

## 2021-05-07 DIAGNOSIS — S42201A Unspecified fracture of upper end of right humerus, initial encounter for closed fracture: Secondary | ICD-10-CM | POA: Diagnosis not present

## 2021-05-07 DIAGNOSIS — X58XXXA Exposure to other specified factors, initial encounter: Secondary | ICD-10-CM | POA: Diagnosis not present

## 2021-05-07 DIAGNOSIS — M25511 Pain in right shoulder: Secondary | ICD-10-CM | POA: Diagnosis not present

## 2021-05-07 DIAGNOSIS — S32030A Wedge compression fracture of third lumbar vertebra, initial encounter for closed fracture: Secondary | ICD-10-CM | POA: Diagnosis not present

## 2021-05-07 DIAGNOSIS — M545 Low back pain, unspecified: Secondary | ICD-10-CM | POA: Diagnosis not present

## 2021-05-15 DIAGNOSIS — M25511 Pain in right shoulder: Secondary | ICD-10-CM | POA: Diagnosis not present

## 2021-05-15 DIAGNOSIS — S42201A Unspecified fracture of upper end of right humerus, initial encounter for closed fracture: Secondary | ICD-10-CM | POA: Diagnosis not present

## 2021-05-20 DIAGNOSIS — S32000A Wedge compression fracture of unspecified lumbar vertebra, initial encounter for closed fracture: Secondary | ICD-10-CM | POA: Diagnosis not present

## 2021-05-26 DIAGNOSIS — M25511 Pain in right shoulder: Secondary | ICD-10-CM | POA: Diagnosis not present

## 2021-05-26 DIAGNOSIS — S42201A Unspecified fracture of upper end of right humerus, initial encounter for closed fracture: Secondary | ICD-10-CM | POA: Diagnosis not present

## 2021-06-03 DIAGNOSIS — D509 Iron deficiency anemia, unspecified: Secondary | ICD-10-CM | POA: Diagnosis not present

## 2021-06-03 DIAGNOSIS — I5032 Chronic diastolic (congestive) heart failure: Secondary | ICD-10-CM | POA: Diagnosis not present

## 2021-06-03 DIAGNOSIS — E1169 Type 2 diabetes mellitus with other specified complication: Secondary | ICD-10-CM | POA: Diagnosis not present

## 2021-06-03 DIAGNOSIS — E785 Hyperlipidemia, unspecified: Secondary | ICD-10-CM | POA: Diagnosis not present

## 2021-06-03 DIAGNOSIS — Z6841 Body Mass Index (BMI) 40.0 and over, adult: Secondary | ICD-10-CM | POA: Diagnosis not present

## 2021-06-03 DIAGNOSIS — I10 Essential (primary) hypertension: Secondary | ICD-10-CM | POA: Diagnosis not present

## 2021-06-03 DIAGNOSIS — I480 Paroxysmal atrial fibrillation: Secondary | ICD-10-CM | POA: Diagnosis not present

## 2021-06-03 DIAGNOSIS — E039 Hypothyroidism, unspecified: Secondary | ICD-10-CM | POA: Diagnosis not present

## 2021-06-04 DIAGNOSIS — S42201A Unspecified fracture of upper end of right humerus, initial encounter for closed fracture: Secondary | ICD-10-CM | POA: Diagnosis not present

## 2021-06-12 DIAGNOSIS — R922 Inconclusive mammogram: Secondary | ICD-10-CM | POA: Diagnosis not present

## 2021-06-12 DIAGNOSIS — Z853 Personal history of malignant neoplasm of breast: Secondary | ICD-10-CM | POA: Diagnosis not present

## 2021-06-12 DIAGNOSIS — D0511 Intraductal carcinoma in situ of right breast: Secondary | ICD-10-CM | POA: Diagnosis not present

## 2021-06-25 DIAGNOSIS — R040 Epistaxis: Secondary | ICD-10-CM | POA: Diagnosis not present

## 2021-06-26 DIAGNOSIS — R040 Epistaxis: Secondary | ICD-10-CM

## 2021-06-26 DIAGNOSIS — Z7901 Long term (current) use of anticoagulants: Secondary | ICD-10-CM | POA: Insufficient documentation

## 2021-06-26 DIAGNOSIS — J343 Hypertrophy of nasal turbinates: Secondary | ICD-10-CM | POA: Diagnosis not present

## 2021-06-26 HISTORY — DX: Long term (current) use of anticoagulants: Z79.01

## 2021-06-26 HISTORY — DX: Epistaxis: R04.0

## 2021-06-27 DIAGNOSIS — S32000A Wedge compression fracture of unspecified lumbar vertebra, initial encounter for closed fracture: Secondary | ICD-10-CM | POA: Diagnosis not present

## 2021-07-02 DIAGNOSIS — D0511 Intraductal carcinoma in situ of right breast: Secondary | ICD-10-CM | POA: Diagnosis not present

## 2021-07-23 DIAGNOSIS — S42201A Unspecified fracture of upper end of right humerus, initial encounter for closed fracture: Secondary | ICD-10-CM | POA: Diagnosis not present

## 2021-08-01 ENCOUNTER — Other Ambulatory Visit: Payer: Self-pay

## 2021-08-01 ENCOUNTER — Ambulatory Visit: Payer: PPO | Admitting: Cardiology

## 2021-08-01 ENCOUNTER — Encounter: Payer: Self-pay | Admitting: Cardiology

## 2021-08-01 VITALS — BP 132/62 | HR 61 | Ht 61.0 in | Wt 202.8 lb

## 2021-08-01 DIAGNOSIS — I480 Paroxysmal atrial fibrillation: Secondary | ICD-10-CM | POA: Diagnosis not present

## 2021-08-01 DIAGNOSIS — I7090 Unspecified atherosclerosis: Secondary | ICD-10-CM | POA: Diagnosis not present

## 2021-08-01 DIAGNOSIS — E119 Type 2 diabetes mellitus without complications: Secondary | ICD-10-CM

## 2021-08-01 DIAGNOSIS — I2510 Atherosclerotic heart disease of native coronary artery without angina pectoris: Secondary | ICD-10-CM

## 2021-08-01 DIAGNOSIS — Z7901 Long term (current) use of anticoagulants: Secondary | ICD-10-CM

## 2021-08-01 NOTE — Patient Instructions (Signed)
Medication Instructions:  ?Your physician has recommended you make the following change in your medication:  ? ?Stop Isosorbide (Imdur) ? ?*If you need a refill on your cardiac medications before your next appointment, please call your pharmacy* ? ? ?Lab Work: ?None ordered ?If you have labs (blood work) drawn today and your tests are completely normal, you will receive your results only by: ?MyChart Message (if you have MyChart) OR ?A paper copy in the mail ?If you have any lab test that is abnormal or we need to change your treatment, we will call you to review the results. ? ? ?Testing/Procedures: ?Your physician has requested that you have an echocardiogram. Echocardiography is a painless test that uses sound waves to create images of your heart. It provides your doctor with information about the size and shape of your heart and how well your heart?s chambers and valves are working. This procedure takes approximately one hour. There are no restrictions for this procedure. ? ? ? ?Follow-Up: ?At [facility], you and your health needs are our priority.  As part of our continuing mission to provide you with exceptional heart care, we have created designated Provider Care Teams.  These Care Teams include your primary Cardiologist (physician) and Advanced Practice Providers (APPs -  Physician Assistants and Nurse Practitioners) who all work together to provide you with the care you need, when you need it. ? ?We recommend signing up for the patient portal called "MyChart".  Sign up information is provided on this After Visit Summary.  MyChart is used to connect with patients for Virtual Visits (Telemedicine).  Patients are able to view lab/test results, encounter notes, upcoming appointments, etc.  Non-urgent messages can be sent to your provider as well.   ?To learn more about what you can do with MyChart, go to [email or web address].   ? ?Your next appointment:   ?9 month(s) ? ?The format for your next  appointment:   ?In Person ? ?Provider:   ?Jyl Heinz, MD ? ? ?Other Instructions ?Echocardiogram ?An echocardiogram is a test that uses sound waves (ultrasound) to produce images of the heart. ?Images from an echocardiogram can provide important information about: ?Heart size and shape. ?The size and thickness and movement of your heart's walls. ?Heart muscle function and strength. ?Heart valve function or if you have stenosis. Stenosis is when the heart valves are too narrow. ?If blood is flowing backward through the heart valves (regurgitation). ?A tumor or infectious growth around the heart valves. ?Areas of heart muscle that are not working well because of poor blood flow or injury from a heart attack. ?Aneurysm detection. An aneurysm is a weak or damaged part of an artery wall. The wall bulges out from the normal force of blood pumping through the body. ?Tell a health care provider about: ?Any allergies you have. ?All medicines you are taking, including vitamins, herbs, eye drops, creams, and over-the-counter medicines. ?Any blood disorders you have. ?Any surgeries you have had. ?Any medical conditions you have. ?Whether you are pregnant or may be pregnant. ?What are the risks? ?Generally, this is a safe test. However, problems may occur, including an allergic reaction to dye (contrast) that may be used during the test. ?What happens before the test? ?No specific preparation is needed. You may eat and drink normally. ?What happens during the test? ?You will take off your clothes from the waist up and put on a hospital gown. ?Electrodes or electrocardiogram (ECG)patches may be placed on your chest. The electrodes or patches  are then connected to a device that monitors your heart rate and rhythm. ?You will lie down on a table for an ultrasound exam. A gel will be applied to your chest to help sound waves pass through your skin. ?A handheld device, called a transducer, will be pressed against your chest and  moved over your heart. The transducer produces sound waves that travel to your heart and bounce back (or "echo" back) to the transducer. These sound waves will be captured in real-time and changed into images of your heart that can be viewed on a video monitor. The images will be recorded on a computer and reviewed by your health care provider. ?You may be asked to change positions or hold your breath for a short time. This makes it easier to get different views or better views of your heart. ?In some cases, you may receive contrast through an IV in one of your veins. This can improve the quality of the pictures from your heart. ?The procedure may vary among health care providers and hospitals.   ?What can I expect after the test? ?You may return to your normal, everyday life, including diet, activities, and medicines, unless your health care provider tells you not to do that. ?Follow these instructions at home: ?It is up to you to get the results of your test. Ask your health care provider, or the department that is doing the test, when your results will be ready. ?Keep all follow-up visits. This is important. ?Summary ?An echocardiogram is a test that uses sound waves (ultrasound) to produce images of the heart. ?Images from an echocardiogram can provide important information about the size and shape of your heart, heart muscle function, heart valve function, and other possible heart problems. ?You do not need to do anything to prepare before this test. You may eat and drink normally. ?After the echocardiogram is completed, you may return to your normal, everyday life, unless your health care provider tells you not to do that. ?This information is not intended to replace advice given to you by your health care provider. Make sure you discuss any questions you have with your health care provider. ?Document Revised: 12/26/2019 Document Reviewed: 12/26/2019 ?Elsevier Patient Education ? [place]. ? ? ?

## 2021-08-01 NOTE — Progress Notes (Signed)
?Cardiology Office Note:   ? ?Date:  08/01/2021  ? ?ID:  Crystal Ramos, DOB 1955-06-25, MRN [id number] ? ?PCP:  Nicoletta Dress, MD  ?Cardiologist:  Jenean Lindau, MD  ? ?Referring MD: Nicoletta Dress, MD  ? ? ?ASSESSMENT:   ? ?1. Atherosclerotic vascular disease   ?2. Coronary artery disease involving native coronary artery of native heart without angina pectoris   ?3. Paroxysmal atrial fibrillation (HCC)   ?4. Anticoagulant long-term use   ?5. Morbid obesity ([place])   ?6. Type 2 diabetes mellitus without complication, without long-term current use of insulin ([place])   ? ?PLAN:   ? ?In order of problems listed above: ? ?Coronary artery disease: Secondary prevention stressed with the patient.  Importance of compliance with diet and medication stressed she vocalized understanding.  She was advised to walk at least half an hour a day 5 days a week and she promises to do so. ?Essential hypertension: Blood pressure stable and diet was emphasized.  She gives history suggestive of postural hypotension so I discontinued Imdur and she will keep a track of her blood pressures and let us know. ?Mixed dyslipidemia: Diet was emphasized and lifestyle modification urged.  I reviewed blood work reports obtained from Dr. Marval Regal office ?Diabetes mellitus and obesity: Weight reduction stressed lifestyle modification urged and patient promises to do better. ?Patient will be seen in follow-up appointment in 9 months or earlier if the patient has any concerns ? ? ? ?Medication Adjustments/Labs and Tests Ordered: ?Current medicines are reviewed at length with the patient today.  Concerns regarding medicines are outlined above.  ?No orders of the defined types were placed in this encounter. ? ?No orders of the defined types were placed in this encounter. ? ? ? ?No chief complaint on file. ?  ? ?History of Present Illness:   ? ?Crystal Ramos is a 66 y.o. female.  Patient has past medical history of coronary artery  disease, essential hypertension, dyslipidemia, diabetes mellitus and obesity.  She denies any problems at this time and takes care of activities of daily living.  No chest pain orthopnea or PND.  At the time of my evaluation, the patient is alert awake oriented and in no distress. ? ? ? ?Past Medical History:  ?Diagnosis Date  ? Acute diastolic heart failure ([place])   ? Angina pectoris ([place]) 03/27/2019  ? Atherosclerotic vascular disease 02/25/2017  ? Atrial fibrillation ([place])   ? Bilateral carotid bruits 01/05/2019  ? Cardiac murmur 03/18/2020  ? Chest pain   ? Chronic diastolic heart failure ([place]) 03/08/2019  ? Coronary artery disease involving native coronary artery of native heart without angina pectoris   ? Diabetes mellitus due to underlying condition with unspecified complications ([place]) 43/07/2949  ? 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 Dyslipidemia 02/25/2017  ? Dyspnea on exertion 12/18/2015  ? Essential hypertension   ? Hematuria 11/29/2017  ? Morbid obesity ([place]) 02/25/2017  ? Osteopenia after menopause 12/01/2019  ? Paroxysmal atrial fibrillation ([place]) 02/25/2017  ? Psoriasis 07/31/2020  ? Type 2 diabetes mellitus without complication ([place]) 12/23/4164  ? ? ?Past Surgical History:  ?Procedure Laterality Date  ? CHOLECYSTECTOMY    ? LEFT HEART CATH AND CORONARY ANGIOGRAPHY N/A 04/03/2019  ? Procedure: LEFT HEART CATH AND CORONARY ANGIOGRAPHY;  Surgeon: Burnell Blanks, MD;  Location: [facility];  Service: Cardiovascular;  Laterality: N/A;  ? MEDIAL PARTIAL KNEE REPLACEMENT    ? ? ?  Current Medications: ?Current Meds  ?Medication Sig  ? ALPRAZolam (XANAX) 0.25 MG tablet Take 0.25 mg by mouth at bedtime.   ? amiodarone (PACERONE) 200 MG tablet Take 0.5 tablets (100 mg total) by mouth daily.  ? aspirin EC 81 MG tablet Take 81 mg by mouth daily.  ? atorvastatin (LIPITOR) 80 MG tablet Take 1 tablet by mouth once daily  ? canagliflozin (INVOKANA) 300 MG TABS tablet  Take 300 mg by mouth daily before breakfast.  ? EUTHYROX 88 MCG tablet Take 88 mcg by mouth daily.  ? Ferrous Sulfate (IRON) 325 (65 Fe) MG TABS Take 1 tablet by mouth daily.  ? furosemide (LASIX) 40 MG tablet Take 40 mg by mouth daily.  ? isosorbide mononitrate (IMDUR) 30 MG 24 hr tablet Take 30 mg by mouth daily.  ? JARDIANCE 25 MG TABS tablet Take 25 mg by mouth daily.  ? meclizine (ANTIVERT) 25 MG tablet Take 25 mg by mouth 3 (three) times daily.   ? metFORMIN (GLUCOPHAGE) 850 MG tablet Take 850 mg by mouth 3 (three) times daily.  ? metoprolol succinate (TOPROL-XL) 50 MG 24 hr tablet Take 100 mg by mouth daily.  ? nitroGLYCERIN (NITROSTAT) 0.4 MG SL tablet Place 0.4 mg under the tongue every 5 (five) minutes as needed for chest pain.  ? raloxifene (EVISTA) 60 MG tablet Take 1 tablet by mouth once daily  ? rivaroxaban (XARELTO) 20 MG TABS tablet Take 20 mg by mouth daily with supper.  ? tiZANidine (ZANAFLEX) 4 MG tablet Take 4 mg by mouth 3 (three) times daily as needed for muscle spasms.  ? traMADol (ULTRAM) 50 MG tablet Take 50 mg by mouth 4 (four) times daily as needed for pain.  ?  ? ?Allergies:   Patient has no known allergies.  ? ?Social History  ? ?Socioeconomic History  ? Marital status: Married  ?  Spouse name: Not on file  ? Number of children: Not on file  ? Years of education: Not on file  ? Highest education level: Not on file  ?Occupational History  ? Not on file  ?Tobacco Use  ? Smoking status: Never  ?  Passive exposure: Yes  ? Smokeless tobacco: Never  ? Tobacco comments:  ?  "whole family" smoked in the house  ?Vaping Use  ? Vaping Use: Never used  ?Substance and Sexual Activity  ? Alcohol use: No  ? Drug use: No  ? Sexual activity: Not on file  ?Other Topics Concern  ? Not on file  ?Social History Narrative  ? Not on file  ? ?Social Determinants of Health  ? ?Financial Resource Strain: Not on file  ?Food Insecurity: Not on file  ?Transportation Needs: Not on file  ?Physical Activity: Not on  file  ?Stress: Not on file  ?Social Connections: Not on file  ?  ? ?Family History: ?The patient's family history includes Breast cancer in her paternal aunt; Diabetes in her mother; Stroke in her father and paternal aunt. ? ?ROS:   ?Please see the history of present illness.    ?All other systems reviewed and are negative. ? ?EKGs/Labs/Other Studies Reviewed:   ? ?The following studies were reviewed today: ?EKG reveals sinus rhythm with nonspecific ST-T changes. ? ? ?Recent Labs: ?10/28/2020: ALT 16; BUN 17; Creatinine, Ser 0.81; Potassium 4.0; Sodium 140  ?Recent Lipid Panel ?   ?Component Value Date/Time  ? CHOL 107 10/28/2020 1030  ? TRIG 79 10/28/2020 1030  ? HDL 39 (L) 10/28/2020 1030  ?  CHOLHDL 2.7 10/28/2020 1030  ? [place] 52 10/28/2020 1030  ? ? ?Physical Exam:   ? ?VS:  BP 132/62   Pulse 61   Ht '5\' 1"'$  (1.549 m)   Wt 202 lb 12.8 oz (92 kg)   SpO2 98%   BMI 38.32 kg/m?    ? ?Wt Readings from Last 3 Encounters:  ?08/01/21 202 lb 12.8 oz (92 kg)  ?02/11/21 216 lb (98 kg)  ?01/31/21 216 lb 6.4 oz (98.2 kg)  ?  ? ?GEN: Patient is in no acute distress ?HEENT: Normal ?NECK: No JVD; No carotid bruits ?LYMPHATICS: No lymphadenopathy ?CARDIAC: Hear sounds regular, 2/6 systolic murmur at the apex. ?RESPIRATORY:  Clear to auscultation without rales, wheezing or rhonchi  ?ABDOMEN: Soft, non-tender, non-distended ?MUSCULOSKELETAL:  No edema; No deformity  ?SKIN: Warm and dry ?NEUROLOGIC:  Alert and oriented x 3 ?PSYCHIATRIC:  Normal affect  ? ?Signed, ?Jenean Lindau, MD  ?08/01/2021 9:30 AM    ?[place]  ?

## 2021-08-06 ENCOUNTER — Other Ambulatory Visit: Payer: PPO

## 2021-08-14 ENCOUNTER — Telehealth: Payer: Self-pay | Admitting: Cardiology

## 2021-08-14 DIAGNOSIS — S32000A Wedge compression fracture of unspecified lumbar vertebra, initial encounter for closed fracture: Secondary | ICD-10-CM | POA: Diagnosis not present

## 2021-08-14 NOTE — Telephone Encounter (Signed)
Pt's daughter states mother is having nose bleeds. Would like a call back about managing blood thinners if possible. ? ?Best number is [phone], Janett Billow ? ?Thank you! ?

## 2021-08-14 NOTE — Telephone Encounter (Signed)
Spoke with Crystal Ramos Per DPR who states that her mom is having increased number of nose bleeds and has been having increased falls. She is concerned with her mom being on Xarelto. Please advise. ?

## 2021-08-14 NOTE — Telephone Encounter (Signed)
Left VM for Crystal Ramos to callback. ?

## 2021-08-15 ENCOUNTER — Other Ambulatory Visit: Payer: PPO

## 2021-08-19 ENCOUNTER — Other Ambulatory Visit: Payer: PPO

## 2021-08-19 NOTE — Telephone Encounter (Signed)
Recommendations reviewed with pt as per Dr. Revankar's note.  Pt verbalized understanding and had no additional questions.   

## 2021-08-21 ENCOUNTER — Other Ambulatory Visit: Payer: PPO

## 2021-08-26 ENCOUNTER — Ambulatory Visit (INDEPENDENT_AMBULATORY_CARE_PROVIDER_SITE_OTHER): Payer: PPO

## 2021-08-26 DIAGNOSIS — I7090 Unspecified atherosclerosis: Secondary | ICD-10-CM

## 2021-08-26 DIAGNOSIS — I2510 Atherosclerotic heart disease of native coronary artery without angina pectoris: Secondary | ICD-10-CM

## 2021-08-26 LAB — ECHOCARDIOGRAM COMPLETE
AR max vel: 1.01 cm2
AV Area VTI: 1.1 cm2
AV Area mean vel: 0.86 cm2
AV Mean grad: 14 mmHg
AV Peak grad: 19.3 mmHg
Ao pk vel: 2.2 m/s
Area-P 1/2: 3.48 cm2
S' Lateral: 2.9 cm

## 2021-09-03 DIAGNOSIS — K7469 Other cirrhosis of liver: Secondary | ICD-10-CM | POA: Diagnosis not present

## 2021-09-03 DIAGNOSIS — E1169 Type 2 diabetes mellitus with other specified complication: Secondary | ICD-10-CM | POA: Diagnosis not present

## 2021-09-03 DIAGNOSIS — I480 Paroxysmal atrial fibrillation: Secondary | ICD-10-CM | POA: Diagnosis not present

## 2021-09-03 DIAGNOSIS — E039 Hypothyroidism, unspecified: Secondary | ICD-10-CM | POA: Diagnosis not present

## 2021-09-03 DIAGNOSIS — I5032 Chronic diastolic (congestive) heart failure: Secondary | ICD-10-CM | POA: Diagnosis not present

## 2021-09-03 DIAGNOSIS — I10 Essential (primary) hypertension: Secondary | ICD-10-CM | POA: Diagnosis not present

## 2021-09-03 DIAGNOSIS — E785 Hyperlipidemia, unspecified: Secondary | ICD-10-CM | POA: Diagnosis not present

## 2021-09-03 DIAGNOSIS — D509 Iron deficiency anemia, unspecified: Secondary | ICD-10-CM | POA: Diagnosis not present

## 2021-10-31 ENCOUNTER — Telehealth: Payer: Self-pay | Admitting: Cardiology

## 2021-10-31 MED ORDER — AMIODARONE HCL 200 MG PO TABS: 100.0000 mg | ORAL_TABLET | Freq: Every day | ORAL | 2 refills | Status: DC

## 2021-10-31 NOTE — Telephone Encounter (Signed)
Refill of Amiodarone 200 mg sent to [place], [place].

## 2021-10-31 NOTE — Telephone Encounter (Signed)
?*  STAT* If patient is at the pharmacy, call can be transferred to refill team. ? ? ?1. Which medications need to be refilled? (please list name of each medication and dose if known) amiodarone (PACERONE) 200 MG tablet ? ?2. Which pharmacy/location (including street and city if local pharmacy) is medication to be sent to? [facility] - [place], [place] - [address] ? ?3. Do they need a 30 day or 90 day supply? 90 ? ?

## 2021-11-07 NOTE — Progress Notes (Deleted)
[facility]  [address] 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 Mammogram from earlier this year is benign. She continues daily Raloxifene without difficulty. She is scheduled for repeat mammogram January 2023 and follow up with Dr. Lilia Pro.       The patient understands the plans discussed today and is in agreement with them.  She knows to contact our office if she develops concerns prior to her next appointment.    Derwood Kaplan, MD  [facility] [address] [place] [address] Dept: [phone] Dept Fax: [phone]   No orders of the defined types were placed in this encounter.     CHIEF COMPLAINT:  CC: A 66 year old with history of ductal carcinoma in situ of right breast with comedonecrosis here for evaluation.  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   08/01/2019 Cancer Staging   Staging form: Breast, AJCC 8th Edition - Clinical stage from 08/01/2019: Stage 0 (cTis (DCIS), cN0, cM0, G3, ER+, PR+, HER2: Not Assessed) - Signed by Derwood Kaplan, MD on 07/31/2020 Histopathologic type: Intraductal carcinoma, noninfiltrating, NOS Method of lymph node assessment: Clinical Nuclear grade: G3 Multigene prognostic tests performed: None Histologic grading system: 3 grade system Menopausal status: Postmenopausal Stage used in treatment planning: Yes National guidelines used in treatment planning: Yes Type of national  guideline used in treatment planning: NCCN Staging comments: 12 mm, lumpectomy and XRT, chemoprevention with raloxifene      INTERVAL HISTORY:  Aldene is here today for repeat clinical assessment. She has been well since last visit with no new issues concerning her breasts. She is tolerating Raloxifene daily without concerns. She denies fever, chills, nausea or vomiting. She denies shortness of breath, chest pain or cough. She denies issue with bowel or bladder.   REVIEW OF SYSTEMS:  Review of Systems  Constitutional:  Negative for appetite change, chills, diaphoresis, fatigue, fever and unexpected weight change.  HENT:   Negative for hearing loss, lump/mass, mouth sores, nosebleeds, sore throat, tinnitus, trouble swallowing and voice change.   Eyes:  Negative for eye problems and icterus.  Respiratory:  Negative for chest tightness, cough, hemoptysis, shortness of breath and wheezing.   Cardiovascular:  Negative for chest pain, leg swelling and palpitations.  Gastrointestinal:  Negative for abdominal distention, abdominal pain, blood in stool, constipation, diarrhea, nausea, rectal pain and vomiting.  Endocrine: Negative for hot flashes.  Genitourinary:  Negative for bladder incontinence, difficulty urinating, dyspareunia, dysuria, frequency, hematuria and nocturia.   Musculoskeletal:  Negative for arthralgias, back pain, flank pain, gait problem, myalgias, neck pain and neck stiffness.  Skin:  Negative for itching, rash and wound.  Neurological:  Negative for dizziness, extremity weakness, gait problem, headaches, light-headedness, numbness, seizures and speech difficulty.  Hematological:  Negative for adenopathy. Does not bruise/bleed easily.  Psychiatric/Behavioral:  Negative for confusion, decreased concentration, depression, sleep disturbance and suicidal ideas. The patient is not nervous/anxious.     VITALS:  There were no vitals taken for this visit.  Wt Readings from Last 3  Encounters:  08/01/21 202 lb 12.8 oz (92 kg)  02/11/21 216 lb (98 kg)  01/31/21 216 lb 6.4 oz (98.2 kg)    There is no height or weight on file to calculate BMI.  Performance status (ECOG): 1 - Symptomatic but completely ambulatory  PHYSICAL EXAM:  Physical Exam Constitutional:      General: She is not in acute distress.    Appearance: Normal appearance. She is normal weight. She is not ill-appearing, toxic-appearing or diaphoretic.  HENT:     Head: Normocephalic and atraumatic.     Nose: Nose normal. No congestion or rhinorrhea.     Mouth/Throat:     Mouth: Mucous membranes are moist.     Pharynx: Oropharynx is clear. No oropharyngeal exudate or posterior oropharyngeal erythema.  Eyes:     General: No scleral icterus.       Right eye: No discharge.        Left eye: No discharge.     Extraocular Movements: Extraocular movements intact.     Conjunctiva/sclera: Conjunctivae normal.     Pupils: Pupils are equal, round, and reactive to light.  Neck:     Vascular: No carotid bruit.  Cardiovascular:     Rate and Rhythm: Normal rate and regular rhythm.     Heart sounds: No murmur heard.    No friction rub. No gallop.  Pulmonary:     Effort: Pulmonary effort is normal. No respiratory distress.     Breath sounds: Normal breath sounds. No stridor. No wheezing, rhonchi or rales.  Chest:     Chest wall: No mass, lacerations, deformity, swelling, tenderness, crepitus or edema. There is no dullness to percussion.  Breasts:    Breasts are symmetrical.     Right: Normal. No swelling, bleeding, inverted nipple, mass, nipple discharge, skin change or tenderness.     Left: Normal. No swelling, bleeding, inverted nipple, mass, nipple discharge, skin change or tenderness.  Abdominal:     General: Abdomen is flat. Bowel sounds are normal. There is no distension.     Palpations: There is no mass.     Tenderness: There is no abdominal tenderness. There is no right CVA tenderness, left CVA  tenderness, guarding or rebound.     Hernia: No hernia is present.  Musculoskeletal:        General: No swelling, tenderness, deformity or signs of injury. Normal range of motion.     Cervical back: Normal range of motion and neck supple. No rigidity or tenderness.     Right lower leg: No edema.     Left lower leg: No edema.  Lymphadenopathy:     Cervical: No cervical adenopathy.     Upper Body:     Right upper body: No supraclavicular, axillary or pectoral adenopathy.     Left upper body: No supraclavicular, axillary or pectoral adenopathy.  Skin:    General: Skin is warm and dry.     Capillary Refill: Capillary refill takes less than 2 seconds.     Coloration: Skin is not jaundiced or pale.     Findings: No bruising, erythema, lesion or rash.  Neurological:     General: No focal deficit present.     Mental Status: She is alert and oriented to person, place, and time. Mental status is at baseline.     Cranial Nerves: No cranial nerve deficit.     Sensory: No sensory deficit.     Motor: No weakness.  Coordination: Coordination normal.     Gait: Gait normal.     Deep Tendon Reflexes: Reflexes normal.  Psychiatric:        Mood and Affect: Mood normal.        Behavior: Behavior normal.        Thought Content: Thought content normal.        Judgment: Judgment normal.    LABS:      Latest Ref Rng & Units 03/27/2019   12:38 PM 06/07/2017    9:12 AM 04/12/2017    3:00 PM  CBC  WBC 3.4 - 10.8 x10E3/uL 6.1  5.7  7.8   Hemoglobin 11.1 - 15.9 g/dL 13.3  14.3  14.4   Hematocrit 34.0 - 46.6 % 41.9  41.8  43.4   Platelets 150 - 450 x10E3/uL 185  140  178       Latest Ref Rng & Units 10/28/2020   10:30 AM 09/23/2020    1:02 PM 03/27/2019   12:38 PM  CMP  Glucose 65 - 99 mg/dL 100  232  81   BUN 8 - 27 mg/dL $Remove'17  16  14   'MxRJHTs$ Creatinine 0.57 - 1.00 mg/dL 0.81  0.72  0.88   Sodium 134 - 144 mmol/L 140  141  140   Potassium 3.5 - 5.2 mmol/L 4.0  3.8  3.7   Chloride 96 - 106 mmol/L 103   103  100   CO2 20 - 29 mmol/L $RemoveB'24  24  26   'wfMZMhji$ Calcium 8.7 - 10.3 mg/dL 8.6  8.6  8.8   Total Protein 6.0 - 8.5 g/dL 6.4     Total Bilirubin 0.0 - 1.2 mg/dL 0.7     Alkaline Phos 44 - 121 IU/L 92     AST 0 - 40 IU/L 29     ALT 0 - 32 IU/L 16        No results found for: "CEA1", "CEA" / No results found for: "CEA1", "CEA" No results found for: "PSA1" No results found for: "HYI502" No results found for: "CAN125"  No results found for: "TOTALPROTELP", "ALBUMINELP", "A1GS", "A2GS", "BETS", "BETA2SER", "GAMS", "MSPIKE", "SPEI" No results found for: "TIBC", "FERRITIN", "IRONPCTSAT" No results found for: "LDH"  STUDIES:  No results found.    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 08/01/2019   Dyslipidemia 02/25/2017   Dyspnea on exertion 12/18/2015   Essential hypertension    Hematuria 11/29/2017   Morbid obesity ([facility]) 02/25/2017   Osteopenia after menopause 12/01/2019   Paroxysmal atrial fibrillation ([facility]) 02/25/2017   Psoriasis 07/31/2020   Type 2 diabetes mellitus without complication ([facility]) 12/22/7670    Past Surgical History:  Procedure Laterality Date   CHOLECYSTECTOMY     LEFT HEART CATH AND CORONARY ANGIOGRAPHY N/A 04/03/2019   Procedure: LEFT HEART CATH AND CORONARY ANGIOGRAPHY;  Surgeon: Burnell Blanks, MD;  Location: [facility];  Service: Cardiovascular;  Laterality: N/A;   MEDIAL PARTIAL  KNEE REPLACEMENT       Family History  Problem Relation Age of Onset   Diabetes Mother    Stroke Father    Stroke Paternal Aunt    Breast cancer Paternal Aunt     Social History:  reports that she has never smoked. She has been exposed to tobacco smoke. She has never used smokeless tobacco. She reports that she does not drink alcohol and does not use drugs.The patient is alone  today.  Allergies: No Known Allergies  Current Medications: Current Outpatient Medications  Medication Sig Dispense Refill   ALPRAZolam (XANAX) 0.25 MG tablet Take 0.25 mg by mouth at bedtime.      amiodarone (PACERONE) 200 MG tablet Take 0.5 tablets (100 mg total) by mouth daily. 45 tablet 2   aspirin EC 81 MG tablet Take 81 mg by mouth daily.     atorvastatin (LIPITOR) 80 MG tablet Take 1 tablet by mouth once daily 90 tablet 3   canagliflozin (INVOKANA) 300 MG TABS tablet Take 300 mg by mouth daily before breakfast.     EUTHYROX 88 MCG tablet Take 88 mcg by mouth daily.     Ferrous Sulfate (IRON) 325 (65 Fe) MG TABS Take 1 tablet by mouth daily.     furosemide (LASIX) 40 MG tablet Take 40 mg by mouth daily.     isosorbide mononitrate (IMDUR) 30 MG 24 hr tablet Take 30 mg by mouth daily.     JARDIANCE 25 MG TABS tablet Take 25 mg by mouth daily.     meclizine (ANTIVERT) 25 MG tablet Take 25 mg by mouth 3 (three) times daily.      metFORMIN (GLUCOPHAGE) 850 MG tablet Take 850 mg by mouth 3 (three) times daily.     metoprolol succinate (TOPROL-XL) 50 MG 24 hr tablet Take 100 mg by mouth daily.     nitroGLYCERIN (NITROSTAT) 0.4 MG SL tablet Place 0.4 mg under the tongue every 5 (five) minutes as needed for chest pain.     raloxifene (EVISTA) 60 MG tablet Take 1 tablet by mouth once daily 90 tablet 3   rivaroxaban (XARELTO) 20 MG TABS tablet Take 20 mg by mouth daily with supper.     tiZANidine (ZANAFLEX) 4 MG tablet Take 4 mg by mouth 3 (three) times daily as needed for muscle spasms.     traMADol (ULTRAM) 50 MG tablet Take 50 mg  by mouth 4 (four) times daily as needed for pain.     No current facility-administered medications for this visit.

## 2021-11-11 ENCOUNTER — Ambulatory Visit: Payer: PPO | Admitting: Oncology

## 2021-11-11 DIAGNOSIS — D0511 Intraductal carcinoma in situ of right breast: Secondary | ICD-10-CM

## 2021-11-14 DIAGNOSIS — S32000A Wedge compression fracture of unspecified lumbar vertebra, initial encounter for closed fracture: Secondary | ICD-10-CM | POA: Diagnosis not present

## 2021-11-26 ENCOUNTER — Telehealth: Payer: Self-pay | Admitting: Hematology and Oncology

## 2021-11-26 ENCOUNTER — Encounter: Payer: Self-pay | Admitting: Hematology and Oncology

## 2021-11-26 ENCOUNTER — Inpatient Hospital Stay: Payer: PPO | Attending: Oncology | Admitting: Hematology and Oncology

## 2021-11-26 DIAGNOSIS — D0511 Intraductal carcinoma in situ of right breast: Secondary | ICD-10-CM

## 2021-11-26 NOTE — Progress Notes (Unsigned)
[facility]  [address] 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  She was treated with lumpectomy.  Estrogen and progesterone receptors were highly positive.  She completed adjuvant radiation in June.  She was placed on chemoprevention with raloxifene 60 mg daily in July 2021, which is planned for 5 years.  She continues to tolerate this without difficulty.  She remains without evidence of recurrence.  She knows to continue raloxifene daily.    We will plan to see her back in 4 months for repeat examination.    The patient understands the plans discussed today and is in agreement with them.  She knows to contact our office if she develops concerns prior to her next appointment.   I provided 10 minutes of face-to-face time during this encounter and > 50% was spent counseling as documented under my assessment and plan.    Crystal Pickles, PA-C  [facility] [address] [place] [address] Dept: [phone] Dept Fax: [phone]   No orders of the defined types were placed in this encounter.     CHIEF COMPLAINT:  CC: Ductal carcinoma in situ of the right breast  Current Treatment: Chemoprevention with raloxifene 60 mg daily  HISTORY OF PRESENT ILLNESS:  Crystal Ramos is a 66 y.o. female with stage 0 right breast cancer diagnosed in March 2021.  Annual mammography in December 2021 revealed suspicious calcifications within the right breast.  Unilateral right mammogram in February confirmed suspicious developing calcifications in the upper inner quadrant of the right breast spanning an area of 1 cm.  Stereotactic biopsy in March revealed  high grade ductal carcinoma in situ with comedo necrosis.  No invasive carcinoma was identified.  Bilateral breast MRI  was negative for additional sites of malignancy.  She underwent right breast lumpectomy and attempted sentinel lymph node biopsy by Dr. Lilia Ramos.  Surgical pathology revealed ductal carcinoma in situ, grade 3, measuring 12 mm in extent.  Margins were free of neoplasm.  No invasive carcinoma identified.  Estrogen receptor was 60% positive and progesterone receptor was 70% positive.  She completed adjuvant radiation with a 60 Gy course to the right breast in early June.  She was placed on chemoprevention with raloxifene in July 2021.  She has tolerated this without difficulty.  Bilateral diagnostic mammogram in January did not reveal any evidence of malignancy.  She started menarche at age 50, and has had 2 children with her first child at age 31.  She went through menopause at age 71-50, and was not placed on hormones.  She has psoriasis, which is treated with injections through the dermatologist.  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   08/01/2019 Cancer Staging   Staging form: Breast, AJCC 8th Edition - Clinical stage from 08/01/2019: Stage 0 (cTis (DCIS), cN0, cM0, G3, ER+, PR+, HER2: Not Assessed) - Signed by Crystal Kaplan, MD on 07/31/2020 Histopathologic type: Intraductal carcinoma, noninfiltrating, NOS Method of lymph node assessment: Clinical Nuclear grade: G3 Multigene prognostic tests performed: None Histologic grading system: 3 grade system  Menopausal status: Postmenopausal Stage used in treatment planning: Yes National guidelines used in treatment planning: Yes Type of national guideline used in treatment planning: NCCN Staging comments: 12 mm, lumpectomy and XRT, chemoprevention with raloxifene       INTERVAL  HISTORY:  Crystal Ramos is here today for repeat clinical assessment.  She states she continues raloxifene daily without difficulty.  She denies any changes in her breasts.. She denies fevers or chills. She has intermittent right shoulder discomfort.  She is status post right shoulder fracture in November 2022.  She did not require surgery for this.  Her appetite is good. Her weight has increased 3 pounds over last 4 months .  She continues to follow with Dr. Lilia Ramos he schedules her mammograms.  She states she has routine labs drawn at Crystal Ramos office  REVIEW OF SYSTEMS:  Review of Systems  Constitutional:  Negative for appetite change, chills, fatigue, fever and unexpected weight change.  HENT:   Negative for lump/mass, mouth sores and sore throat.   Respiratory:  Negative for cough and shortness of breath.   Cardiovascular:  Negative for chest pain and leg swelling.  Gastrointestinal:  Negative for abdominal pain, constipation, diarrhea, nausea and vomiting.  Endocrine: Negative for hot flashes.  Genitourinary:  Negative for difficulty urinating, dysuria, frequency, hematuria, vaginal bleeding and vaginal discharge.   Musculoskeletal:  Negative for arthralgias, back pain and myalgias.  Skin:  Negative for rash.  Neurological:  Negative for dizziness and headaches.  Hematological:  Negative for adenopathy. Does not bruise/bleed easily.  Psychiatric/Behavioral:  Negative for depression and sleep disturbance. The patient is not nervous/anxious.      VITALS:  Blood pressure 132/66, pulse (!) 59, temperature 97.7 F (36.5 C), temperature source Oral, resp. rate 18, height $RemoveBe'5\' 1"'QXCaSrlbm$  (1.549 m), weight 205 lb 6.4 oz (93.2 kg), SpO2 96 %.  Wt Readings from Last 3 Encounters:  11/26/21 205 lb 6.4 oz (93.2 kg)  08/01/21 202 lb 12.8 oz (92 kg)  02/11/21 216 lb (98 kg)    Body mass index is 38.81 kg/m.  Performance status (ECOG): 0 - Asymptomatic  PHYSICAL EXAM:  Physical Exam Vitals and nursing  note reviewed.  Constitutional:      General: She is not in acute distress.    Appearance: Normal appearance.  HENT:     Head: Normocephalic and atraumatic.     Mouth/Throat:     Mouth: Mucous membranes are moist.     Pharynx: Oropharynx is clear. No oropharyngeal exudate or posterior oropharyngeal erythema.  Eyes:     General: No scleral icterus.    Extraocular Movements: Extraocular movements intact.     Conjunctiva/sclera: Conjunctivae normal.     Pupils: Pupils are equal, round, and reactive to light.  Cardiovascular:     Rate and Rhythm: Normal rate and regular rhythm.     Heart sounds: Normal heart sounds. No murmur heard.    No friction rub. No gallop.  Pulmonary:     Effort: Pulmonary effort is normal.     Breath sounds: Normal breath sounds. No wheezing, rhonchi or rales.  Chest:  Breasts:    Right: Normal. No swelling, bleeding, inverted nipple, mass, nipple discharge, skin change or tenderness.     Left: Normal. No swelling, bleeding, inverted nipple, mass, nipple discharge, skin change or tenderness.  Abdominal:     General: There is no distension.     Palpations: Abdomen is soft. There is no hepatomegaly, splenomegaly or mass.     Tenderness:  There is no abdominal tenderness.  Musculoskeletal:        General: Normal range of motion.     Cervical back: Normal range of motion and neck supple. No tenderness.     Right lower leg: No edema.     Left lower leg: No edema.  Lymphadenopathy:     Cervical: No cervical adenopathy.     Upper Body:     Right upper body: No supraclavicular or axillary adenopathy.     Left upper body: No supraclavicular or axillary adenopathy.     Lower Body: No right inguinal adenopathy. No left inguinal adenopathy.  Skin:    General: Skin is warm and dry.     Coloration: Skin is not jaundiced.     Findings: No rash.  Neurological:     Mental Status: She is alert and oriented to person, place, and time.     Cranial Nerves: No cranial  nerve deficit.  Psychiatric:        Mood and Affect: Mood normal.        Behavior: Behavior normal.        Thought Content: Thought content normal.    LABS:      Latest Ref Rng & Units 03/27/2019   12:38 PM 06/07/2017    9:12 AM 04/12/2017    3:00 PM  CBC  WBC 3.4 - 10.8 x10E3/uL 6.1  5.7  7.8   Hemoglobin 11.1 - 15.9 g/dL 13.3  14.3  14.4   Hematocrit 34.0 - 46.6 % 41.9  41.8  43.4   Platelets 150 - 450 x10E3/uL 185  140  178       Latest Ref Rng & Units 10/28/2020   10:30 AM 09/23/2020    1:02 PM 03/27/2019   12:38 PM  CMP  Glucose 65 - 99 mg/dL 100  232  81   BUN 8 - 27 mg/dL $Remove'17  16  14   'hmkuapO$ Creatinine 0.57 - 1.00 mg/dL 0.81  0.72  0.88   Sodium 134 - 144 mmol/L 140  141  140   Potassium 3.5 - 5.2 mmol/L 4.0  3.8  3.7   Chloride 96 - 106 mmol/L 103  103  100   CO2 20 - 29 mmol/L $RemoveB'24  24  26   'lkflomcS$ Calcium 8.7 - 10.3 mg/dL 8.6  8.6  8.8   Total Protein 6.0 - 8.5 g/dL 6.4     Total Bilirubin 0.0 - 1.2 mg/dL 0.7     Alkaline Phos 44 - 121 IU/L 92     AST 0 - 40 IU/L 29     ALT 0 - 32 IU/L 16        No results found for: "CEA1", "CEA" / No results found for: "CEA1", "CEA" No results found for: "PSA1" No results found for: "PPJ093" No results found for: "CAN125"  No results found for: "TOTALPROTELP", "ALBUMINELP", "A1GS", "A2GS", "BETS", "BETA2SER", "GAMS", "MSPIKE", "SPEI" No results found for: "TIBC", "FERRITIN", "IRONPCTSAT" No results found for: "LDH"  STUDIES:  No results found.    Exam(s): 0126-0013 MAM/MAM DIGITAL W/TOMO DIAG B  CLINICAL DATA: 66 year old female presenting for annual exam.  History of right breast cancer status post lumpectomy in 2021. No  new problems.  EXAM:  DIGITAL DIAGNOSTIC BILATERAL MAMMOGRAM WITH TOMOSYNTHESIS AND CAD  TECHNIQUE:  Bilateral digital diagnostic mammography and breast tomosynthesis  was performed. The images were evaluated with computer-aided  detection.  COMPARISON: Previous exam(s).  ACR Breast Density Category b: There  are scattered  areas of  fibroglandular density.  FINDINGS:  Right breast: A spot 2D magnification view of the lumpectomy site  was performed in addition to standard views. There are stable  postsurgical changes. No suspicious mass, distortion, or  microcalcifications are identified to suggest presence of  malignancy.  Left breast: No suspicious mass, distortion, or microcalcifications  are identified to suggest presence of malignancy.  IMPRESSION:  Stable benign postsurgical changes in the right breast. No  mammographic evidence of malignancy in the left breast.  RECOMMENDATION:  Diagnostic bilateral mammogram in 1 year.  I have discussed the findings and recommendations with the patient.  If applicable, a reminder letter will be sent to the patient  regarding the next appointment.  BI-RADS CATEGORY 2: Benign.   HISTORY:   Past Medical History:  Diagnosis Date  . Acute diastolic heart failure ([place])   . Angina pectoris ([place]) 03/27/2019  . 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 Atrial fibrillation ([place])   . Bilateral carotid bruits 01/05/2019  . Cardiac murmur 03/18/2020  . Chest pain   . Chronic diastolic heart failure ([place]) 03/08/2019  . Coronary artery disease involving native coronary artery of native heart without angina pectoris   . Diabetes mellitus due to underlying condition with unspecified complications ([place]) 05/24/4942  . 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 Dyslipidemia 02/25/2017  . Dyspnea on exertion 12/18/2015  . Essential hypertension   . Hematuria 11/29/2017  . Morbid obesity ([place]) 02/25/2017  . Osteopenia after menopause 12/01/2019  . Paroxysmal atrial fibrillation ([place]) 02/25/2017  . Psoriasis 07/31/2020  . Type 2 diabetes mellitus without complication ([place]) 01/21/7590    Past Surgical History:  Procedure Laterality Date  . CHOLECYSTECTOMY    . LEFT HEART CATH AND CORONARY ANGIOGRAPHY N/A 04/03/2019    Procedure: LEFT HEART CATH AND CORONARY ANGIOGRAPHY;  Surgeon: Burnell Blanks, MD;  Location: [facility];  Service: Cardiovascular;  Laterality: N/A;  . MEDIAL PARTIAL KNEE REPLACEMENT      Family History  Problem Relation Age of Onset  . Diabetes Mother   . Stroke Father   . Stroke Paternal Aunt   . Breast cancer Paternal Aunt     Social History:  reports that she has never smoked. She has been exposed to tobacco smoke. She has never used smokeless tobacco. She reports that she does not drink alcohol and does not use drugs.The patient is alone today.  Allergies: No Known Allergies  Current Medications: Current Outpatient Medications  Medication Sig Dispense Refill  . ALPRAZolam (XANAX) 0.25 MG tablet Take 0.25 mg by mouth at bedtime.     Marland Kitchen amiodarone (PACERONE) 200 MG tablet Take 0.5 tablets (100 mg total) by mouth daily. 45 tablet 2  . aspirin EC 81 MG tablet Take 81 mg by mouth daily.    Marland Kitchen atorvastatin (LIPITOR) 80 MG tablet Take 1 tablet by mouth once daily 90 tablet 3  . canagliflozin (INVOKANA) 300 MG TABS tablet Take 300 mg by mouth daily before breakfast.    . EUTHYROX 88 MCG tablet Take 88 mcg by mouth daily.    . Ferrous Sulfate (IRON) 325 (65 Fe) MG TABS Take 1 tablet by mouth daily.    . furosemide (LASIX) 40 MG tablet Take 40 mg by mouth daily.    . isosorbide mononitrate (IMDUR) 30 MG 24 hr tablet Take 30 mg by mouth daily.    Marland Kitchen JARDIANCE 25 MG TABS tablet Take 25 mg by mouth daily.    Marland Kitchen  meclizine (ANTIVERT) 25 MG tablet Take 25 mg by mouth 3 (three) times daily.     . metFORMIN (GLUCOPHAGE) 850 MG tablet Take 850 mg by mouth 3 (three) times daily.    . metoprolol succinate (TOPROL-XL) 50 MG 24 hr tablet Take 100 mg by mouth daily.    . nitroGLYCERIN (NITROSTAT) 0.4 MG SL tablet Place 0.4 mg under the tongue every 5 (five) minutes as needed for chest pain.    . raloxifene (EVISTA) 60 MG tablet Take 1 tablet by mouth once daily 90 tablet 3  .  rivaroxaban (XARELTO) 20 MG TABS tablet Take 20 mg by mouth daily with supper.    Marland Kitchen tiZANidine (ZANAFLEX) 4 MG tablet Take 4 mg by mouth 3 (three) times daily as needed for muscle spasms.    . traMADol (ULTRAM) 50 MG tablet Take 50 mg by mouth 4 (four) times daily as needed for pain.     No current facility-administered medications for this visit.

## 2021-11-26 NOTE — Telephone Encounter (Signed)
Per 11/26/21 los next appt scheduled and confirmed with patient 

## 2021-11-26 NOTE — Assessment & Plan Note (Addendum)
Stage 0 ductal carcinoma in situ diagnosed in March 2021.  She was treated with lumpectomy.  Estrogen and progesterone receptors were highly positive.  She completed adjuvant radiation in June.  She was placed on chemoprevention with raloxifene 60 mg daily in July 2021, which is planned for 5 years.  She continues to tolerate this without difficulty.  She remains without evidence of recurrence.  She knows to continue raloxifene daily.    We will plan to see her back in 4 months for repeat examination.

## 2021-12-09 DIAGNOSIS — Z6839 Body mass index (BMI) 39.0-39.9, adult: Secondary | ICD-10-CM | POA: Diagnosis not present

## 2021-12-09 DIAGNOSIS — E785 Hyperlipidemia, unspecified: Secondary | ICD-10-CM | POA: Diagnosis not present

## 2021-12-09 DIAGNOSIS — I5032 Chronic diastolic (congestive) heart failure: Secondary | ICD-10-CM | POA: Diagnosis not present

## 2021-12-09 DIAGNOSIS — I10 Essential (primary) hypertension: Secondary | ICD-10-CM | POA: Diagnosis not present

## 2021-12-09 DIAGNOSIS — I480 Paroxysmal atrial fibrillation: Secondary | ICD-10-CM | POA: Diagnosis not present

## 2021-12-09 DIAGNOSIS — K7469 Other cirrhosis of liver: Secondary | ICD-10-CM | POA: Diagnosis not present

## 2021-12-09 DIAGNOSIS — D509 Iron deficiency anemia, unspecified: Secondary | ICD-10-CM | POA: Diagnosis not present

## 2021-12-09 DIAGNOSIS — E1169 Type 2 diabetes mellitus with other specified complication: Secondary | ICD-10-CM | POA: Diagnosis not present

## 2021-12-09 DIAGNOSIS — E039 Hypothyroidism, unspecified: Secondary | ICD-10-CM | POA: Diagnosis not present

## 2021-12-29 ENCOUNTER — Encounter: Payer: Self-pay | Admitting: Cardiology

## 2021-12-29 ENCOUNTER — Ambulatory Visit (INDEPENDENT_AMBULATORY_CARE_PROVIDER_SITE_OTHER): Payer: PPO | Admitting: Cardiology

## 2021-12-29 VITALS — BP 126/78 | HR 98 | Ht 61.0 in | Wt 208.4 lb

## 2021-12-29 DIAGNOSIS — I7090 Unspecified atherosclerosis: Secondary | ICD-10-CM | POA: Diagnosis not present

## 2021-12-29 DIAGNOSIS — E119 Type 2 diabetes mellitus without complications: Secondary | ICD-10-CM

## 2021-12-29 DIAGNOSIS — I2510 Atherosclerotic heart disease of native coronary artery without angina pectoris: Secondary | ICD-10-CM | POA: Diagnosis not present

## 2021-12-29 DIAGNOSIS — Z7901 Long term (current) use of anticoagulants: Secondary | ICD-10-CM | POA: Diagnosis not present

## 2021-12-29 DIAGNOSIS — I480 Paroxysmal atrial fibrillation: Secondary | ICD-10-CM | POA: Diagnosis not present

## 2021-12-29 DIAGNOSIS — I10 Essential (primary) hypertension: Secondary | ICD-10-CM | POA: Diagnosis not present

## 2021-12-29 MED ORDER — EZETIMIBE 10 MG PO TABS: 10.0000 mg | ORAL_TABLET | Freq: Every day | ORAL | 3 refills | Status: DC

## 2021-12-29 NOTE — Patient Instructions (Signed)
Medication Instructions:  Your physician has recommended you make the following change in your medication:   Stop Isosorbide.  Start Zetia 10 mg daily.  *If you need a refill on your cardiac medications before your next appointment, please call your pharmacy*   Lab Work: Your physician recommends that you return for lab work in: 6 weeks You need to have labs done when you are fasting.  You can come Monday through Friday 8:30 am to 12:00 pm and 1:15 to 4:30. You do not need to make an appointment as the order has already been placed. The labs you are going to have done are BMET, LFT and Lipids.  If you have labs (blood work) drawn today and your tests are completely normal, you will receive your results only by: [place] (if you have MyChart) OR A paper copy in the mail If you have any lab test that is abnormal or we need to change your treatment, we will call you to review the results.   Testing/Procedures: None ordered   Follow-Up: At [facility], you and your health needs are our priority.  As part of our continuing mission to provide you with exceptional heart care, we have created designated Provider Care Teams.  These Care Teams include your primary Cardiologist (physician) and Advanced Practice Providers (APPs -  Physician Assistants and Nurse Practitioners) who all work together to provide you with the care you need, when you need it.  We recommend signing up for the patient portal called "MyChart".  Sign up information is provided on this After Visit Summary.  MyChart is used to connect with patients for Virtual Visits (Telemedicine).  Patients are able to view lab/test results, encounter notes, upcoming appointments, etc.  Non-urgent messages can be sent to your provider as well.   To learn more about what you can do with MyChart, go to [email or web address].    Your next appointment:   9 month(s)  The format for your next appointment:   In Person  Provider:    Jyl Heinz, MD   Other Instructions Ezetimibe Tablets What is this medication? EZETIMIBE (ez ET i mibe) treats high cholesterol. It works by reducing the amount of cholesterol absorbed from the food you eat. This decreases the amount of bad cholesterol (such as LDL) in your blood. Changes to diet and exercise are often combined with this medication. This medicine may be used for other purposes; ask your health care provider or pharmacist if you have questions. COMMON BRAND NAME(S): Zetia What should I tell my care team before I take this medication? They need to know if you have any of these conditions: Kidney disease Liver disease Muscle cramps, pain Muscle injury Thyroid disease An unusual or allergic reaction to ezetimibe, other medications, foods, dyes, or preservatives Pregnant or trying to get pregnant Breast-feeding How should I use this medication? Take this medication by mouth. Take it as directed on the prescription label at the same time every day. You can take it with or without food. If it upsets your stomach, take it with food. Keep taking it unless your care team tells you to stop. Take bile acid sequestrants at a different time of day than this medication. Take this medication 2 hours BEFORE or 4 hours AFTER bile acid sequestrants. Talk to your care team about the use of this medication in children. While it may be prescribed for children as young as 10 for selected conditions, precautions do apply. Overdosage: If you think you have taken  too much of this medicine contact a poison control center or emergency room at once. NOTE: This medicine is only for you. Do not share this medicine with others. What if I miss a dose? If you miss a dose, take it as soon as you can. If it is almost time for your next dose, take only that dose. Do not take double or extra doses. What may interact with this medication? Do not take this medication with any of the  following: Fenofibrate Gemfibrozil This medication may also interact with the following: Antacids Cyclosporine Herbal medications like red yeast rice Other medications to lower cholesterol or triglycerides This list may not describe all possible interactions. Give your health care provider a list of all the medicines, herbs, non-prescription drugs, or dietary supplements you use. Also tell them if you smoke, drink alcohol, or use illegal drugs. Some items may interact with your medicine. What should I watch for while using this medication? Visit your care team for regular checks on your progress. Tell your care team if your symptoms do not start to get better or if they get worse. Your care team may tell you to stop taking this medication if you develop muscle problems. If your muscle problems do not go away after stopping this medication, contact your care team. Do not become pregnant while taking this medication. Women should inform their care team if they wish to become pregnant or think they might be pregnant. There is potential for serious harm to an unborn child. Talk to your care team for more information. Do not breast-feed an infant while taking this medication. Taking this medication is only part of a total heart healthy program. Your care team may give you a special diet to follow. Avoid alcohol. Avoid smoking. Ask your care team how much you should exercise. What side effects may I notice from receiving this medication? Side effects that you should report to your doctor or health care provider as soon as possible: Allergic reactions--skin rash, itching or hives, swelling of the face, lips, tongue, or throat Side effects that usually do not require medical attention (report to your doctor or health care provider if they continue or are bothersome): Diarrhea Joint pain This list may not describe all possible side effects. Call your doctor for medical advice about side effects. You may  report side effects to FDA at 1-800-FDA-1088. Where should I keep my medication? Keep out of the reach of children and pets. Store at room temperature between 15 and 30 degrees C (59 and 86 degrees F). Protect from moisture. Get rid of any unused medication after the expiration date. NOTE: This sheet is a summary. It may not cover all possible information. If you have questions about this medicine, talk to your doctor, pharmacist, or health care provider.  2023 Elsevier/Gold Standard (2004-06-17 00:00:00)

## 2021-12-29 NOTE — Progress Notes (Signed)
Cardiology Office Note:    Date:  12/29/2021   ID:  Crystal Ramos, DOB [birth year], MRN [id number]  PCP:  Nicoletta Dress, MD  Cardiologist:  Jenean Lindau, MD   Referring MD: Nicoletta Dress, MD    ASSESSMENT:    1. Atherosclerotic vascular disease   2. Coronary artery disease involving native coronary artery of native heart without angina pectoris   3. Essential hypertension   4. Paroxysmal atrial fibrillation (HCC)   5. Anticoagulant long-term use   6. Morbid obesity ([place])   7. Type 2 diabetes mellitus without complication, without long-term current use of insulin (HCC)    PLAN:    In order of problems listed above:  Coronary artery disease: Secondary prevention stressed with the patient.  Importance of compliance with diet and medication stressed and she vocalized understanding she was advised to walk at least half an hour a day 5 days a week and she promises to do so. Essential hypertension: Blood pressure stable and diet was emphasized. Mixed dyslipidemia: Lipids are elevated.  Diet emphasized.  I added Zetia to her regimen she will be back in 6 weeks for liver lipid check.  Diet and exercise stressed.  Diet sheets were given to her. Diabetes mellitus: Followed by primary care. Morbid obesity: Weight reduction stressed and risks of obesity emphasized.  Questions were answered to her satisfaction. Paroxysmal atrial fibrillation:I discussed with the patient atrial fibrillation, disease process. Management and therapy including rate and rhythm control, anticoagulation benefits and potential risks were discussed extensively with the patient. Patient had multiple questions which were answered to patient's satisfaction. Patient will be seen in follow-up appointment in 6 months or earlier if the patient has any concerns    Medication Adjustments/Labs and Tests Ordered: Current medicines are reviewed at length with the patient today.  Concerns regarding medicines are  outlined above.  Orders Placed This Encounter  Procedures   Basic metabolic panel   Hepatic function panel   Lipid panel   EKG 12-Lead   Meds ordered this encounter  Medications   ezetimibe (ZETIA) 10 MG tablet    Sig: Take 1 tablet (10 mg total) by mouth daily.    Dispense:  90 tablet    Refill:  3     No chief complaint on file.    History of Present Illness:    Crystal Ramos is a 66 y.o. female.  Patient has past medical history of coronary artery disease, essential hypertension, dyslipidemia and diabetes mellitus.  She has paroxysmal atrial fibrillation and leads a sedentary lifestyle.  She denies any chest pain orthopnea or PND.  At the time of my evaluation, the patient is alert awake oriented and in no distress.  I have blood work from primary care provider's office.  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   Dyslipidemia 02/25/2017   Dyspnea on exertion 12/18/2015   Epistaxis 06/26/2021  Essential hypertension    Hematuria 11/29/2017   Morbid obesity ([place]) 02/25/2017   Osteopenia after menopause 12/01/2019   Paroxysmal atrial fibrillation ([place]) 02/25/2017   Psoriasis 07/31/2020   Type 2 diabetes mellitus without complication ([place]) 01/25/3715    Past Surgical History:  Procedure Laterality Date   CHOLECYSTECTOMY     LEFT HEART CATH AND CORONARY ANGIOGRAPHY N/A 04/03/2019    Procedure: LEFT HEART CATH AND CORONARY ANGIOGRAPHY;  Surgeon: Burnell Blanks, MD;  Location: [facility];  Service: Cardiovascular;  Laterality: N/A;   MEDIAL PARTIAL KNEE REPLACEMENT      Current Medications: Current Meds  Medication Sig   ALPRAZolam (XANAX) 0.25 MG tablet Take 0.25 mg by mouth at bedtime.    amiodarone (PACERONE) 200 MG tablet Take 0.5 tablets (100 mg total) by mouth daily.   aspirin EC 81 MG tablet Take 81 mg by mouth daily.   atorvastatin (LIPITOR) 80 MG tablet Take 1 tablet by mouth once daily   canagliflozin (INVOKANA) 300 MG TABS tablet Take 300 mg by mouth daily before breakfast.   EUTHYROX 88 MCG tablet Take 88 mcg by mouth daily.   ezetimibe (ZETIA) 10 MG tablet Take 1 tablet (10 mg total) by mouth daily.   Ferrous Sulfate (IRON) 325 (65 Fe) MG TABS Take 1 tablet by mouth daily.   furosemide (LASIX) 40 MG tablet Take 40 mg by mouth daily.   JARDIANCE 25 MG TABS tablet Take 25 mg by mouth daily.   meclizine (ANTIVERT) 25 MG tablet Take 25 mg by mouth 3 (three) times daily.    metFORMIN (GLUCOPHAGE) 850 MG tablet Take 850 mg by mouth 3 (three) times daily.   metoprolol succinate (TOPROL-XL) 50 MG 24 hr tablet Take 100 mg by mouth daily.   nitroGLYCERIN (NITROSTAT) 0.4 MG SL tablet Place 0.4 mg under the tongue every 5 (five) minutes as needed for chest pain.   raloxifene (EVISTA) 60 MG tablet Take 1 tablet by mouth once daily   rivaroxaban (XARELTO) 20 MG TABS tablet Take 20 mg by mouth daily with supper.   tiZANidine (ZANAFLEX) 4 MG tablet Take 4 mg by mouth 3 (three) times daily as needed for muscle spasms.   traMADol (ULTRAM) 50 MG tablet Take 50 mg by mouth 4 (four) times daily as needed for pain.   [DISCONTINUED] isosorbide mononitrate (IMDUR) 30 MG 24 hr tablet Take 30 mg by mouth daily.     Allergies:   Patient has no known allergies.   Social History   Socioeconomic History   Marital status: Married    Spouse name: Not on file    Number of children: Not on file   Years of education: Not on file   Highest education level: Not on file  Occupational History   Not on file  Tobacco Use   Smoking status: Never    Passive exposure: Yes   Smokeless tobacco: Never   Tobacco comments:    "whole family" smoked in the house  Vaping Use   Vaping Use: Never used  Substance and Sexual Activity   Alcohol use: No   Drug use: No   Sexual activity: Not on file  Other Topics Concern   Not on file  Social History Narrative   Not on file   Social Determinants of Health   Financial Resource Strain: Not on file  Food Insecurity: Not on file  Transportation Needs: Not on file  Physical Activity: Not on file  Stress: Not on file  Social Connections: Not on file     Family History: The patient's family history includes Breast cancer in her paternal aunt; Diabetes in her mother; Stroke in her father and paternal aunt.  ROS:   Please see the history of present illness.    All other systems reviewed and are negative.  EKGs/Labs/Other Studies Reviewed:    The following studies were reviewed today: EKG reveals sinus rhythm nonspecific ST changes   Recent Labs: No results found for requested labs within last 365 days.  Recent Lipid Panel    Component Value Date/Time   CHOL 107 10/28/2020 1030   TRIG 79 10/28/2020 1030   HDL 39 (L) 10/28/2020 1030   CHOLHDL 2.7 10/28/2020 1030   LDLCALC 52 10/28/2020 1030    Physical Exam:    VS:  BP 126/78   Pulse 98   Ht '5\' 1"'$  (1.549 m)   Wt 208 lb 6.4 oz (94.5 kg)   SpO2 97%   BMI 39.38 kg/m     Wt Readings from Last 3 Encounters:  12/29/21 208 lb 6.4 oz (94.5 kg)  11/26/21 205 lb 6.4 oz (93.2 kg)  08/01/21 202 lb 12.8 oz (92 kg)     GEN: Patient is in no acute distress HEENT: Normal NECK: No JVD; No carotid bruits LYMPHATICS: No lymphadenopathy CARDIAC: Hear sounds regular, 2/6 systolic murmur at the apex. RESPIRATORY:  Clear to auscultation without rales,  wheezing or rhonchi  ABDOMEN: Soft, non-tender, non-distended MUSCULOSKELETAL:  No edema; No deformity  SKIN: Warm and dry NEUROLOGIC:  Alert and oriented x 3 PSYCHIATRIC:  Normal affect   Signed, Jenean Lindau, MD  12/29/2021 9:30 AM    [place]

## 2022-01-08 ENCOUNTER — Other Ambulatory Visit: Payer: Self-pay

## 2022-01-08 NOTE — Patient Outreach (Signed)
  Care Coordination   Outreach  Visit Note   01/08/2022 Name: Crystal Ramos MRN: [id number] DOB: Feb 22, 1956  Crystal Ramos is a 66 y.o. year old female who sees Crystal Dress, MD for primary care. I spoke with  Crystal Ramos by phone today  What matters to the patients health and wellness today?  Placed call to patient today to review [facility] care coordination program. Explained program. Patient declines services at this time.      Goals Addressed               This Visit's Progress     Care Coordination activities- no follow up required (pt-stated)        Care Coordination Interventions: Advised patient to call MD office and schedule her AWV.         SDOH assessments and interventions completed:  No     Care Coordination Interventions Activated:  No  Care Coordination Interventions:  No, not indicated   Follow up plan: No further intervention required.   Encounter Outcome:  Pt. Refused   Tomasa Rand, RN, BSN, CEN [facility] [employer] [phone]

## 2022-01-12 DIAGNOSIS — I2510 Atherosclerotic heart disease of native coronary artery without angina pectoris: Secondary | ICD-10-CM | POA: Diagnosis not present

## 2022-01-13 LAB — LIPID PANEL
Chol/HDL Ratio: 2.9 ratio (ref 0.0–4.4)
Cholesterol, Total: 164 mg/dL (ref 100–199)
HDL: 56 mg/dL (ref >39)
LDL Chol Calc (NIH): 95 mg/dL (ref 0–99)
Triglycerides: 69 mg/dL (ref 0–149)
VLDL Cholesterol Cal: 13 mg/dL (ref 5–40)

## 2022-01-13 LAB — HEPATIC FUNCTION PANEL
ALT: 10 IU/L (ref 0–32)
AST: 22 IU/L (ref 0–40)
Albumin: 3.7 g/dL — ABNORMAL LOW (ref 3.9–4.9)
Alkaline Phosphatase: 122 IU/L — ABNORMAL HIGH (ref 44–121)
Bilirubin Total: 0.5 mg/dL (ref 0.0–1.2)
Bilirubin, Direct: 0.18 mg/dL (ref 0.00–0.40)
Total Protein: 6.9 g/dL (ref 6.0–8.5)

## 2022-01-13 LAB — BASIC METABOLIC PANEL
BUN/Creatinine Ratio: 11 — ABNORMAL LOW (ref 12–28)
BUN: 10 mg/dL (ref 8–27)
CO2: 26 mmol/L (ref 20–29)
Calcium: 9.3 mg/dL (ref 8.7–10.3)
Chloride: 104 mmol/L (ref 96–106)
Creatinine, Ser: 0.87 mg/dL (ref 0.57–1.00)
Glucose: 116 mg/dL — ABNORMAL HIGH (ref 70–99)
Potassium: 4.6 mmol/L (ref 3.5–5.2)
Sodium: 141 mmol/L (ref 134–144)
eGFR: 73 mL/min/{1.73_m2} (ref >59)

## 2022-01-16 ENCOUNTER — Telehealth: Payer: Self-pay

## 2022-01-16 ENCOUNTER — Telehealth: Payer: Self-pay | Admitting: Cardiology

## 2022-01-16 MED ORDER — NEXLIZET 180-10 MG PO TABS: 1.0000 | ORAL_TABLET | Freq: Every day | ORAL | 0 refills | Status: DC

## 2022-01-16 NOTE — Telephone Encounter (Signed)
Spoke with pt. She will start Nexlizet per Dr. Julien Nordmann order.Samples #4 boxes provided

## 2022-01-16 NOTE — Telephone Encounter (Signed)
  Pt is calling back to get lab result. She said, her daughter did not remember what medication she need to take. She said to call her at [phone]

## 2022-02-17 ENCOUNTER — Telehealth: Payer: Self-pay

## 2022-02-17 ENCOUNTER — Other Ambulatory Visit: Payer: Self-pay

## 2022-02-17 MED ORDER — NEXLIZET 180-10 MG PO TABS: 1.0000 | ORAL_TABLET | Freq: Every day | ORAL | 3 refills | Status: DC

## 2022-02-17 NOTE — Telephone Encounter (Signed)
Patient calling the office for samples of medication:   1.  What medication and dosage are you requesting samples for? Nexlizet  2.  Are you currently out of this medication? Out of medication  Pt states they need to know if they are suppose to come for more samples or other options available. Please advise.

## 2022-02-17 NOTE — Telephone Encounter (Signed)
PA submitted on CMM for Nexlizet 180-10 mg. Key [id number].

## 2022-02-18 NOTE — Telephone Encounter (Signed)
PA approved for Nexlizet 180-10 mg from 02/17/22 until 08/19/22.

## 2022-02-19 NOTE — Telephone Encounter (Signed)
Pt c/o medication issue:  1. Name of Medication:   Bempedoic Acid-Ezetimibe (NEXLIZET) 180-10 MG TABS  2. How are you currently taking this medication (dosage and times per day)?    As prescribed  3. Are you having a reaction (difficulty breathing--STAT)?   4. What is your medication issue?    Patient called to follow-up on getting more samples of Nexlizet 180-10 mg or an alternate medication.

## 2022-02-19 NOTE — Telephone Encounter (Signed)
Pt aware that we do not have samples but that her PA was approved. Pt states she will call the pharmacy.

## 2022-02-23 ENCOUNTER — Telehealth: Payer: Self-pay | Admitting: Cardiology

## 2022-02-23 NOTE — Telephone Encounter (Signed)
Pt is requesting call back in regards to a severe nose bleed she seems to be having and would like to know if she should stop taking her blood thinner for the time being. Requesting call back.

## 2022-02-23 NOTE — Telephone Encounter (Signed)
Pt called to report that she had a nose bleed this morning and it was hard to control but better now... she said a few moths ago she had to have her nasal passage cauterized and has done well since then... she denies headache or any other symptoms... she will monitor it.Marland Kitchen and if reoccurs will talk to her PCP about seeing the ENT again... for now she will not stop her Xarelto but will let us know if it keeps happening and/ or worsens.

## 2022-03-06 ENCOUNTER — Telehealth: Payer: Self-pay | Admitting: Cardiology

## 2022-03-06 NOTE — Telephone Encounter (Signed)
Pt advised to for the ED for evaluation of shortness of breath, lightheadedness and chest pain radiating into the arm. Pt verbalized understanding and had no additional questions.

## 2022-03-06 NOTE — Telephone Encounter (Signed)
Patient c/o Palpitations:  High priority if patient c/o lightheadedness, shortness of breath, or chest pain  How long have you had palpitations/irregular HR/ Afib? Are you having the symptoms now? 2 days, yes  Are you currently experiencing lightheadedness, SOB or CP? Chest pain, feels something in arm too, has had lightheadedness and SOB  Do you have a history of afib (atrial fibrillation) or irregular heart rhythm? yes  Have you checked your BP or HR? (document readings if available): no  Are you experiencing any other symptoms? Pain in right eye  Patient states she is having a light chest pain and has been having palpitations. She says strokes also run in her family. Put patient on hold to contact triage and she hung up.

## 2022-03-12 DIAGNOSIS — E039 Hypothyroidism, unspecified: Secondary | ICD-10-CM | POA: Diagnosis not present

## 2022-03-12 DIAGNOSIS — I10 Essential (primary) hypertension: Secondary | ICD-10-CM | POA: Diagnosis not present

## 2022-03-12 DIAGNOSIS — D509 Iron deficiency anemia, unspecified: Secondary | ICD-10-CM | POA: Diagnosis not present

## 2022-03-12 DIAGNOSIS — I5032 Chronic diastolic (congestive) heart failure: Secondary | ICD-10-CM | POA: Diagnosis not present

## 2022-03-12 DIAGNOSIS — E785 Hyperlipidemia, unspecified: Secondary | ICD-10-CM | POA: Diagnosis not present

## 2022-03-12 DIAGNOSIS — Z23 Encounter for immunization: Secondary | ICD-10-CM | POA: Diagnosis not present

## 2022-03-12 DIAGNOSIS — I480 Paroxysmal atrial fibrillation: Secondary | ICD-10-CM | POA: Diagnosis not present

## 2022-03-12 DIAGNOSIS — E1169 Type 2 diabetes mellitus with other specified complication: Secondary | ICD-10-CM | POA: Diagnosis not present

## 2022-03-12 DIAGNOSIS — Z6841 Body Mass Index (BMI) 40.0 and over, adult: Secondary | ICD-10-CM | POA: Diagnosis not present

## 2022-03-17 DIAGNOSIS — I10 Essential (primary) hypertension: Secondary | ICD-10-CM | POA: Diagnosis not present

## 2022-03-17 DIAGNOSIS — E785 Hyperlipidemia, unspecified: Secondary | ICD-10-CM | POA: Diagnosis not present

## 2022-03-17 DIAGNOSIS — E039 Hypothyroidism, unspecified: Secondary | ICD-10-CM | POA: Diagnosis not present

## 2022-03-29 NOTE — Progress Notes (Signed)
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  She was treated with lumpectomy.  Estrogen and progesterone receptors were highly positive.  She completed adjuvant radiation in June.  She was placed on chemoprevention with raloxifene 60 mg daily in July 2021, which is planned for 5 years.  She continues to tolerate this without difficulty.  She remains without evidence of recurrence.  She knows to continue raloxifene daily.      Osteopenia Bone density scan from November 24, 2019 reveals mild osteopenia of the femur and spine which is relatively stable.  She is due for a repeat DEXA.  I will get that scheduled and call her with the results.  She has had a fracture of the shoulder last year and a sprained wrist with a fall this year, but fortunately not a fracture.  I will prescribe a walker.    She will continue the raloxifene and I will refill that today.  I will order a bone density scan so that we have current information.  I will prescribe a walker since she is at risk for falls and has lost her balance.  She was offered physical therapy but declined because of "fixed income " and inability to afford this.  Dr. Lilia Pro will be seeing her in January with annual mammogram.  I will see her back in 6 months for reexamination. The patient understands the plans discussed today and is in agreement with them.  She knows to contact our office if she develops concerns prior to her next appointment.   I provided 20 minutes of face-to-face time during this encounter and > 50% was spent counseling as documented under my assessment and plan.    Derwood Kaplan, MD  [facility] [address] [place] [address] Dept: [phone] Dept Fax: [phone]   No orders of the defined types were placed in this encounter.     CHIEF COMPLAINT:  CC: Ductal carcinoma in situ of the right breast  Current Treatment: Chemoprevention with raloxifene 60 mg daily  HISTORY OF PRESENT ILLNESS:  Crystal Ramos is a 66 y.o. female with stage 0 right breast cancer diagnosed in March 2021.  Annual mammography in December 2021 revealed suspicious calcifications within the right breast.  Unilateral right mammogram in February confirmed suspicious developing calcifications in the upper inner quadrant of the right breast spanning an area of 1 cm.  Stereotactic biopsy in March revealed high grade ductal carcinoma in situ with comedo necrosis.  No invasive carcinoma was identified.  Bilateral breast MRI  was negative for additional sites of malignancy.  She underwent right breast lumpectomy and attempted sentinel lymph node biopsy by Dr. Lilia Pro.  Surgical pathology revealed ductal carcinoma in situ, grade 3, measuring 12 mm in extent.  Margins were free of neoplasm.  No invasive carcinoma identified.  Estrogen receptor was 60% positive and progesterone receptor was 70% positive.  She completed adjuvant radiation with a 60 Gy course to the right breast in early June.  She was placed on chemoprevention with raloxifene in July 2021.  She has tolerated this without difficulty.  Bilateral diagnostic mammogram in January did not reveal  any evidence of malignancy.  She started menarche at age 20, and has had 2 children with her first child at age 13.  She went through menopause at age 57-50, and was not placed on hormones.  She has psoriasis, which is treated with injections through the dermatologist.  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  08/01/2019 Initial Diagnosis   Ductal carcinoma in situ (DCIS) of right  breast with comedonecrosis   08/01/2019 Cancer Staging   Staging form: Breast, AJCC 8th Edition - Clinical stage from 08/01/2019: Stage 0 (cTis (DCIS), cN0, cM0, G3, ER+, PR+, HER2: Not Assessed) - Signed by Derwood Kaplan, MD on 07/31/2020 Histopathologic type: Intraductal carcinoma, noninfiltrating, NOS Method of lymph node assessment: Clinical Nuclear grade: G3 Multigene prognostic tests performed: None Histologic grading system: 3 grade system Menopausal status: Postmenopausal Stage used in treatment planning: Yes National guidelines used in treatment planning: Yes Type of national guideline used in treatment planning: NCCN Staging comments: 12 mm, lumpectomy and XRT, chemoprevention with raloxifene       INTERVAL HISTORY:  Crystal Ramos is here today for repeat clinical assessment.  She states she continues raloxifene daily without difficulty.  She denies any changes in her breasts.. She denies fevers or chills. She has intermittent right shoulder discomfort.  She is status post right shoulder fracture in November 2022.  She did not require surgery for this.  She is still having problems with balance and had a fall with a sprain of her right wrist but fortunately not a fracture.  She does complain of right rib pain since a fall 1 week ago that she rates as a 5 out of 10.  Her appetite is good. Her weight has increased 12 pounds over last 4 months .  She continues to follow with Dr. Lilia Pro he schedules her mammograms in January. She states she has routine labs drawn at Dr. Denton Lank office and we have copies of the ones from October, which look good.  REVIEW OF SYSTEMS:  Review of Systems  Constitutional: Negative.  Negative for appetite change, chills, fatigue, fever and unexpected weight change.  HENT:  Negative.  Negative for lump/mass, mouth sores and sore throat.   Eyes: Negative.   Respiratory: Negative.  Negative for cough and shortness of breath.   Cardiovascular: Negative.   Negative for chest pain and leg swelling.  Gastrointestinal: Negative.  Negative for abdominal pain, constipation, diarrhea, nausea and vomiting.  Endocrine: Negative.  Negative for hot flashes.  Genitourinary:  Negative for difficulty urinating, dysuria, frequency, hematuria, vaginal bleeding and vaginal discharge.   Musculoskeletal: Negative.  Negative for arthralgias, back pain and myalgias.  Skin: Negative.  Negative for rash.  Neurological:  Negative for dizziness and headaches.  Hematological:  Negative for adenopathy. Does not bruise/bleed easily.  Psychiatric/Behavioral:  Negative for depression and sleep disturbance. The patient is not nervous/anxious.      VITALS:  Blood pressure 138/83, pulse (!) 107, temperature 97.6 F (36.4 C), temperature source Oral, resp. rate 20, height _0  (1.549 m), weight 220 lb 12.8 oz (100.2 kg), SpO2 99 %.  Wt Readings from Last 3 Encounters:  03/30/22 220 lb 12.8 oz (100.2 kg)  12/29/21 208 lb 6.4 oz (94.5 kg)  11/26/21 205 lb 6.4 oz (93.2 kg)    Body mass index is 41.72 kg/m.  Performance status (ECOG): 0 - Asymptomatic  PHYSICAL EXAM:  Physical Exam Vitals and nursing note reviewed.  Constitutional:  General: She is not in acute distress.    Appearance: Normal appearance.  HENT:     Head: Normocephalic and atraumatic.     Mouth/Throat:     Mouth: Mucous membranes are moist.     Pharynx: Oropharynx is clear. No oropharyngeal exudate or posterior oropharyngeal erythema.  Eyes:     General: No scleral icterus.    Extraocular Movements: Extraocular movements intact.     Conjunctiva/sclera: Conjunctivae normal.     Pupils: Pupils are equal, round, and reactive to light.  Cardiovascular:     Rate and Rhythm: Normal rate and regular rhythm.     Heart sounds: Murmur heard.     Systolic murmur is present with a grade of 3/6.     No friction rub. No gallop.  Pulmonary:     Effort: Pulmonary effort is normal.     Breath sounds:  Normal breath sounds. No wheezing, rhonchi or rales.  Chest:  Breasts:    Right: Normal. No swelling, bleeding, inverted nipple, mass, nipple discharge, skin change or tenderness.     Left: Normal. No swelling, bleeding, inverted nipple, mass, nipple discharge, skin change or tenderness.  Abdominal:     General: There is no distension.     Palpations: Abdomen is soft. There is no hepatomegaly, splenomegaly or mass.     Tenderness: There is no abdominal tenderness.  Musculoskeletal:        General: Normal range of motion.     Cervical back: Normal range of motion and neck supple. No tenderness.     Right lower leg: Edema (1+) present.     Left lower leg: Edema (1+) present.  Lymphadenopathy:     Cervical: No cervical adenopathy.     Upper Body:     Right upper body: No supraclavicular or axillary adenopathy.     Left upper body: No supraclavicular or axillary adenopathy.     Lower Body: No right inguinal adenopathy. No left inguinal adenopathy.  Skin:    General: Skin is warm and dry.     Coloration: Skin is not jaundiced.     Findings: Bruising present. No rash.  Neurological:     Mental Status: She is alert and oriented to person, place, and time.     Cranial Nerves: No cranial nerve deficit.  Psychiatric:        Mood and Affect: Mood normal.        Behavior: Behavior normal.        Thought Content: Thought content normal.    LABS:      Latest Ref Rng & Units 03/27/2019   12:38 PM 06/07/2017    9:12 AM 04/12/2017    3:00 PM  CBC  WBC 3.4 - 10.8 x10E3/uL 6.1  5.7  7.8   Hemoglobin 11.1 - 15.9 g/dL 13.3  14.3  14.4   Hematocrit 34.0 - 46.6 % 41.9  41.8  43.4   Platelets 150 - 450 x10E3/uL 185  140  178       Latest Ref Rng & Units 01/12/2022    9:04 AM 10/28/2020   10:30 AM 09/23/2020    1:02 PM  CMP  Glucose 70 - 99 mg/dL 116  100  232   BUN 8 - 27 mg/dL _0 Creatinine 0.57 - 1.00 mg/dL 0.87  0.81  0.72   Sodium 134 - 144 mmol/L 141  140  141   Potassium 3.5  - 5.2 mmol/L 4.6  4.0  3.8   Chloride 96 - 106 mmol/L 104  103  103   CO2 20 - 29 mmol/L _0 Calcium 8.7 - 10.3 mg/dL 9.3  8.6  8.6   Total Protein 6.0 - 8.5 g/dL 6.9  6.4    Total Bilirubin 0.0 - 1.2 mg/dL 0.5  0.7    Alkaline Phos 44 - 121 IU/L 122  92    AST 0 - 40 IU/L 22  29    ALT 0 - 32 IU/L 10  16       No results found for: "CEA1", "CEA" / No results found for: "CEA1", "CEA" No results found for: "PSA1" No results found for: "UVO536" No results found for: "CAN125"  No results found for: "TOTALPROTELP", "ALBUMINELP", "A1GS", "A2GS", "BETS", "BETA2SER", "GAMS", "MSPIKE", "SPEI" No results found for: "TIBC", "FERRITIN", "IRONPCTSAT" No results found for: "LDH"  STUDIES:  No results found.    Exam(s): 0126-0013 MAM/MAM DIGITAL W/TOMO DIAG B  CLINICAL DATA: 65 year old female presenting for annual exam.  History of right breast cancer status post lumpectomy in 2021. No  new problems.  EXAM:  DIGITAL DIAGNOSTIC BILATERAL MAMMOGRAM WITH TOMOSYNTHESIS AND CAD  TECHNIQUE:  Bilateral digital diagnostic mammography and breast tomosynthesis  was performed. The images were evaluated with computer-aided  detection.  COMPARISON: Previous exam(s).  ACR Breast Density Category b: There are scattered areas of  fibroglandular density.  FINDINGS:  Right breast: A spot 2D magnification view of the lumpectomy site  was performed in addition to standard views. There are stable  postsurgical changes. No suspicious mass, distortion, or  microcalcifications are identified to suggest presence of  malignancy.  Left breast: No suspicious mass, distortion, or microcalcifications  are identified to suggest presence of malignancy.  IMPRESSION:  Stable benign postsurgical changes in the right breast. No  mammographic evidence of malignancy in the left breast.  RECOMMENDATION:  Diagnostic bilateral mammogram in 1 year.  I have discussed the findings and recommendations with the  patient.  If applicable, a reminder letter will be sent to the patient  regarding the next appointment.  BI-RADS CATEGORY 2: Benign.   HISTORY:   Past Medical History:  Diagnosis Date  . Acute diastolic heart failure ([facility])   . Angina pectoris ([facility]) 03/27/2019  . Anticoagulant long-term use 06/26/2021  . 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 Atrial fibrillation ([facility])   . Bilateral carotid bruits 01/05/2019  . Cardiac murmur 03/18/2020  . Chest pain   . Chronic diastolic heart failure ([facility]) 03/08/2019  . Coronary artery disease involving native coronary artery of native heart without angina pectoris   . Diabetes mellitus due to underlying condition with unspecified complications ([facility]) 64/08/345  . 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 Dyslipidemia 02/25/2017  . Dyspnea on exertion 12/18/2015  . Epistaxis 06/26/2021  . Essential hypertension   . Hematuria 11/29/2017  . Morbid obesity ([facility]) 02/25/2017  . Osteopenia after menopause 12/01/2019  . Paroxysmal atrial fibrillation ([facility]) 02/25/2017  . Psoriasis 07/31/2020  . Type 2 diabetes mellitus without complication ([facility]) 08/18/5954    Past Surgical History:  Procedure Laterality Date  . CHOLECYSTECTOMY    . LEFT HEART CATH AND CORONARY ANGIOGRAPHY N/A 04/03/2019   Procedure: LEFT HEART CATH AND CORONARY ANGIOGRAPHY;  Surgeon: Burnell Blanks, MD;  Location: [facility];  Service: Cardiovascular;  Laterality: N/A;  . MEDIAL PARTIAL KNEE REPLACEMENT      Family History  Problem  Relation Age of Onset  . Diabetes Mother   . Stroke Father   . Stroke Paternal Aunt   . Breast cancer Paternal Aunt     Social History:  reports that she has never smoked. She has been exposed to tobacco smoke. She has never used smokeless tobacco. She reports that she does not drink alcohol and does not use drugs.The patient is alone today.  Allergies: No Known Allergies  Current  Medications: Current Outpatient Medications  Medication Sig Dispense Refill  . ALPRAZolam (XANAX) 0.25 MG tablet Take 0.25 mg by mouth at bedtime.     Marland Kitchen amiodarone (PACERONE) 200 MG tablet Take 0.5 tablets (100 mg total) by mouth daily. 45 tablet 2  . aspirin EC 81 MG tablet Take 81 mg by mouth daily.    Marland Kitchen atorvastatin (LIPITOR) 80 MG tablet Take 1 tablet by mouth once daily 90 tablet 3  . Bempedoic Acid-Ezetimibe (NEXLIZET) 180-10 MG TABS Take 1 tablet by mouth daily. 90 tablet 3  . canagliflozin (INVOKANA) 300 MG TABS tablet Take 300 mg by mouth daily before breakfast.    . EUTHYROX 88 MCG tablet Take 88 mcg by mouth daily.    . Ferrous Sulfate (IRON) 325 (65 Fe) MG TABS Take 1 tablet by mouth daily.    . furosemide (LASIX) 40 MG tablet Take 40 mg by mouth daily.    Marland Kitchen JARDIANCE 25 MG TABS tablet Take 25 mg by mouth daily.    . meclizine (ANTIVERT) 25 MG tablet Take 25 mg by mouth 3 (three) times daily.     . metFORMIN (GLUCOPHAGE) 850 MG tablet Take 850 mg by mouth 3 (three) times daily.    . metoprolol succinate (TOPROL-XL) 50 MG 24 hr tablet Take 100 mg by mouth daily.    . nitroGLYCERIN (NITROSTAT) 0.4 MG SL tablet Place 0.4 mg under the tongue every 5 (five) minutes as needed for chest pain.    . raloxifene (EVISTA) 60 MG tablet Take 1 tablet (60 mg total) by mouth daily. 30 tablet 5  . rivaroxaban (XARELTO) 20 MG TABS tablet Take 20 mg by mouth daily with supper.    Marland Kitchen tiZANidine (ZANAFLEX) 4 MG tablet Take 4 mg by mouth 3 (three) times daily as needed for muscle spasms.    . traMADol (ULTRAM) 50 MG tablet Take 50 mg by mouth 4 (four) times daily as needed for pain.     No current facility-administered medications for this visit.

## 2022-03-30 ENCOUNTER — Other Ambulatory Visit: Payer: Self-pay | Admitting: Oncology

## 2022-03-30 ENCOUNTER — Encounter: Payer: Self-pay | Admitting: Oncology

## 2022-03-30 ENCOUNTER — Inpatient Hospital Stay: Payer: PPO | Attending: Oncology | Admitting: Oncology

## 2022-03-30 VITALS — BP 138/83 | HR 107 | Temp 97.6°F | Resp 20 | Ht 61.0 in | Wt 220.8 lb

## 2022-03-30 DIAGNOSIS — I7090 Unspecified atherosclerosis: Secondary | ICD-10-CM

## 2022-03-30 DIAGNOSIS — D0511 Intraductal carcinoma in situ of right breast: Secondary | ICD-10-CM

## 2022-03-30 DIAGNOSIS — Z780 Asymptomatic menopausal state: Secondary | ICD-10-CM

## 2022-03-30 MED ORDER — RALOXIFENE HCL 60 MG PO TABS: 60.0000 mg | ORAL_TABLET | Freq: Every day | ORAL | 5 refills | Status: DC

## 2022-04-01 DIAGNOSIS — I509 Heart failure, unspecified: Secondary | ICD-10-CM | POA: Diagnosis not present

## 2022-04-07 DIAGNOSIS — M25522 Pain in left elbow: Secondary | ICD-10-CM | POA: Diagnosis not present

## 2022-04-07 DIAGNOSIS — Z043 Encounter for examination and observation following other accident: Secondary | ICD-10-CM | POA: Diagnosis not present

## 2022-04-07 DIAGNOSIS — M79622 Pain in left upper arm: Secondary | ICD-10-CM | POA: Diagnosis not present

## 2022-04-29 ENCOUNTER — Ambulatory Visit: Payer: PPO | Admitting: Cardiology

## 2022-04-30 DIAGNOSIS — M8589 Other specified disorders of bone density and structure, multiple sites: Secondary | ICD-10-CM | POA: Diagnosis not present

## 2022-05-04 ENCOUNTER — Encounter: Payer: Self-pay | Admitting: Oncology

## 2022-05-19 ENCOUNTER — Ambulatory Visit: Payer: PPO | Attending: Cardiology | Admitting: Cardiology

## 2022-05-19 ENCOUNTER — Encounter: Payer: Self-pay | Admitting: Cardiology

## 2022-05-19 VITALS — BP 120/80 | HR 91 | Ht 61.0 in | Wt 213.0 lb

## 2022-05-19 DIAGNOSIS — I480 Paroxysmal atrial fibrillation: Secondary | ICD-10-CM | POA: Diagnosis not present

## 2022-05-19 DIAGNOSIS — I2510 Atherosclerotic heart disease of native coronary artery without angina pectoris: Secondary | ICD-10-CM | POA: Diagnosis not present

## 2022-05-19 DIAGNOSIS — I10 Essential (primary) hypertension: Secondary | ICD-10-CM

## 2022-05-19 DIAGNOSIS — I5031 Acute diastolic (congestive) heart failure: Secondary | ICD-10-CM | POA: Diagnosis not present

## 2022-05-19 DIAGNOSIS — E119 Type 2 diabetes mellitus without complications: Secondary | ICD-10-CM

## 2022-05-19 NOTE — Progress Notes (Signed)
Cardiology Office Note:    Date:  05/19/2022   ID:  Crystal Ramos, DOB 08/19/1955, MRN [id number]  PCP:  Nicoletta Dress, MD  Cardiologist:  Jenean Lindau, MD   Referring MD: Nicoletta Dress, MD    ASSESSMENT:    1. Coronary artery disease involving native coronary artery of native heart without angina pectoris   2. Paroxysmal atrial fibrillation (HCC)   3. Essential hypertension   4. Type 2 diabetes mellitus without complication, without long-term current use of insulin ([facility])   5. Morbid obesity ([facility])    PLAN:    In order of problems listed above:  Coronary artery disease: Secondary prevention stressed with the patient.  Importance of compliance with diet medication stressed and she vocalized understanding. Paroxysmal atrial fibrillation: She has dyspnea on exertion.  She has undergone cardioversion in the past.  She now is in atrial fibrillation.I discussed with the patient atrial fibrillation, disease process. Management and therapy including rate and rhythm control, anticoagulation benefits and potential risks were discussed extensively with the patient. Patient had multiple questions which were answered to patient's satisfaction.  I discussed this with the patient.  Will do a Chem-7 and LFTs today if they are normal then I will increase amiodarone to 400 mg daily and then will bring her back in the next 2 weeks for EKG.  If he remains in atrial fibrillation we will do cardioversion. Essential hypertension: Blood pressure stable and diet was emphasized. Mixed dyslipidemia: On lipid-lowering medications.  Labs by primary care reviewed. Diabetes mellitus and obesity: Lifestyle modification urged and she promises to do better. Patient will be seen in follow-up appointment in 6 months or earlier if the patient has any concerns    Medication Adjustments/Labs and Tests Ordered: Current medicines are reviewed at length with the patient today.  Concerns regarding medicines  are outlined above.  No orders of the defined types were placed in this encounter.  No orders of the defined types were placed in this encounter.    Chief Complaint  Patient presents with   Follow-up     History of Present Illness:    Crystal Ramos is a 67 y.o. female.  Patient has past medical history of coronary artery disease, essential hypertension, paroxysmal atrial fibrillation, diabetes mellitus and morbid obesity.  Overall she leads a sedentary lifestyle.  No chest pain orthopnea or PND.  She gives history of some dyspnea on exertion now.  Her primary care has increased her diuretic therapy.  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 08/01/2019   Dyslipidemia 02/25/2017   Dyspnea on exertion 12/18/2015   Epistaxis 06/26/2021   Essential hypertension    Hematuria 11/29/2017   Morbid obesity ([facility]) 02/25/2017   Osteopenia after menopause 12/01/2019   Paroxysmal atrial fibrillation ([facility]) 02/25/2017   Psoriasis 07/31/2020   Type 2 diabetes mellitus without complication ([facility]) 09/18/2990    Past Surgical History:  Procedure  Laterality Date   CHOLECYSTECTOMY     LEFT HEART CATH AND CORONARY ANGIOGRAPHY N/A 04/03/2019   Procedure: LEFT HEART CATH AND CORONARY ANGIOGRAPHY;  Surgeon: Burnell Blanks, MD;  Location: [facility];  Service: Cardiovascular;  Laterality: N/A;   MEDIAL PARTIAL KNEE  REPLACEMENT      Current Medications: Current Meds  Medication Sig   ALPRAZolam (XANAX) 0.25 MG tablet Take 0.25 mg by mouth at bedtime.    amiodarone (PACERONE) 200 MG tablet Take 0.5 tablets (100 mg total) by mouth daily.   aspirin EC 81 MG tablet Take 81 mg by mouth daily.   atorvastatin (LIPITOR) 80 MG tablet Take 1 tablet by mouth once daily   Bempedoic Acid-Ezetimibe (NEXLIZET) 180-10 MG TABS Take 1 tablet by mouth daily.   canagliflozin (INVOKANA) 300 MG TABS tablet Take 300 mg by mouth daily before breakfast.   EUTHYROX 88 MCG tablet Take 88 mcg by mouth daily.   ezetimibe (ZETIA) 10 MG tablet Take 10 mg by mouth daily.   Ferrous Sulfate (IRON) 325 (65 Fe) MG TABS Take 1 tablet by mouth daily.   furosemide (LASIX) 40 MG tablet Take 40 mg by mouth daily.   JARDIANCE 25 MG TABS tablet Take 25 mg by mouth daily.   meclizine (ANTIVERT) 25 MG tablet Take 25 mg by mouth 3 (three) times daily.    metFORMIN (GLUCOPHAGE) 850 MG tablet Take 850 mg by mouth 3 (three) times daily.   metoprolol succinate (TOPROL-XL) 50 MG 24 hr tablet Take 100 mg by mouth daily.   nitroGLYCERIN (NITROSTAT) 0.4 MG SL tablet Place 0.4 mg under the tongue every 5 (five) minutes as needed for chest pain.   raloxifene (EVISTA) 60 MG tablet Take 1 tablet (60 mg total) by mouth daily.   rivaroxaban (XARELTO) 20 MG TABS tablet Take 20 mg by mouth daily with supper.   tiZANidine (ZANAFLEX) 4 MG tablet Take 4 mg by mouth 3 (three) times daily as needed for muscle spasms.   traMADol (ULTRAM) 50 MG tablet Take 50 mg by mouth 4 (four) times daily as needed for pain.     Allergies:   Patient has no known allergies.   Social History   Socioeconomic History   Marital status: Married    Spouse name: Not on file   Number of children: Not on file   Years of education: Not on file   Highest education level: Not on file  Occupational History   Not on file  Tobacco Use   Smoking status: Never    Passive exposure: Yes    Smokeless tobacco: Never   Tobacco comments:    "whole family" smoked in the house  Vaping Use   Vaping Use: Never used  Substance and Sexual Activity   Alcohol use: No   Drug use: No   Sexual activity: Not on file  Other Topics Concern   Not on file  Social History Narrative   Not on file   Social Determinants of Health   Financial Resource Strain: Not on file  Food Insecurity: Not on file  Transportation Needs: Not on file  Physical Activity: Not on file  Stress: Not on file  Social Connections: Not on file     Family History: The patient's family history includes Breast cancer in her paternal aunt; Diabetes in her mother; Stroke in her father and paternal aunt.  ROS:   Please see the history of present illness.    All other systems reviewed and  are negative.  EKGs/Labs/Other Studies Reviewed:    The following studies were reviewed today: EKG reveals atrial fibrillation with well-controlled ventricular rate.   Recent Labs: 01/12/2022: ALT 10; BUN 10; Creatinine, Ser 0.87; Potassium 4.6; Sodium 141  Recent Lipid Panel    Component Value Date/Time   CHOL 164 01/12/2022 0904   TRIG 69 01/12/2022 0904   HDL 56 01/12/2022 0904   CHOLHDL 2.9 01/12/2022 0904   LDLCALC 95 01/12/2022 0904    Physical Exam:    VS:  BP 120/80 (BP Location: Left Arm, Patient Position: Sitting, Cuff Size: Normal)   Pulse 91   Ht '5\' 1"'$  (1.549 m)   Wt 213 lb (96.6 kg)   SpO2 95%   BMI 40.25 kg/m     Wt Readings from Last 3 Encounters:  05/19/22 213 lb (96.6 kg)  03/30/22 220 lb 12.8 oz (100.2 kg)  12/29/21 208 lb 6.4 oz (94.5 kg)     GEN: Patient is in no acute distress HEENT: Normal NECK: No JVD; No carotid bruits LYMPHATICS: No lymphadenopathy CARDIAC: Hear sounds irregular, 2/6 systolic murmur at the apex. RESPIRATORY:  Clear to auscultation without rales, wheezing or rhonchi  ABDOMEN: Soft, non-tender, non-distended MUSCULOSKELETAL:  No edema; No deformity  SKIN: Warm  and dry NEUROLOGIC:  Alert and oriented x 3 PSYCHIATRIC:  Normal affect   Signed, Jenean Lindau, MD  05/19/2022 3:04 PM    [facility]

## 2022-05-19 NOTE — Patient Instructions (Addendum)
Medication Instructions:  Your physician recommends that you continue on your current medications as directed. Please refer to the Current Medication list given to you today.  If your labs are okay that were done in the office, we will increase your Amiodarone to 400 mg for 2 weeks then decrease to 200 mg daily until cardioversion.  *If you need a refill on your cardiac medications before your next appointment, please call your pharmacy*   Lab Work: Your physician recommends that you have a CMET and CBC today in the office.  If you have labs (blood work) drawn today and your tests are completely normal, you will receive your results only by: [place] (if you have MyChart) OR A paper copy in the mail If you have any lab test that is abnormal or we need to change your treatment, we will call you to review the results.   Testing/Procedures: Your physician has recommended that you have a Cardioversion (DCCV). Electrical Cardioversion uses a jolt of electricity to your heart either through paddles or wired patches attached to your chest. This is a controlled, usually prescheduled, procedure. Defibrillation is done under light anesthesia in the hospital, and you usually go home the day of the procedure. This is done to get your heart back into a normal rhythm. You are not awake for the procedure.   Diagnosis: atrial fib  DATE AND TIME OF PROCEDURE: Potential 06/07/22  Please register at the Admitting Department at: TBD [facility] will call the day prior to the cardioversion with arrival time.  DO NOT EAT OR DRINK ANYTHING after midnight prior to your procedure.  You should take your medications as usual with a sip of water.  If you are diabetic, DO NOT TAKE YOUR DIABETIC MEDICATIONS the morning OF THE PROCEDURE.  If you are taking Lanoxin (also called Digoxin), do NOT take this medication the morning of the procedure.  DO NOT STOP or miss any doses of your  Xarelto blood thinners  that you are taking.  If you miss a dose of this medication let us know as soon as possible as we will need to reschedule your procedure. Missing a dose of the blood thinner and continuing with the cardioversion places you at greater risk for having a stroke.  Have your blood drawn as intructed.  You will need someone with you to drive you home after the procedure.    Follow-Up: At [facility], you and your health needs are our priority.  As part of our continuing mission to provide you with exceptional heart care, we have created designated Provider Care Teams.  These Care Teams include your primary Cardiologist (physician) and Advanced Practice Providers (APPs -  Physician Assistants and Nurse Practitioners) who all work together to provide you with the care you need, when you need it.  We recommend signing up for the patient portal called "MyChart".  Sign up information is provided on this After Visit Summary.  MyChart is used to connect with patients for Virtual Visits (Telemedicine).  Patients are able to view lab/test results, encounter notes, upcoming appointments, etc.  Non-urgent messages can be sent to your provider as well.   To learn more about what you can do with MyChart, go to [email or web address].    Your next appointment:   2 month(s)  The format for your next appointment:   In Person  Provider:   Jyl Heinz, MD   Other Instructions  Electrical Cardioversion Electrical cardioversion is the delivery of a jolt  of electricity to restore a normal rhythm to the heart. A rhythm that is too fast or is not regular keeps the heart from pumping well. In this procedure, sticky patches or metal paddles are placed on the chest to deliver electricity to the heart from a device. This procedure may be done in an emergency if: There is low or no blood pressure as a result of the heart rhythm. Normal rhythm must be restored as fast as possible to protect the brain and heart  from further damage. It may save a life. This may also be a scheduled procedure for irregular or fast heart rhythms that are not immediately life-threatening. Tell a health care provider about: Any allergies you have. All medicines you are taking, including vitamins, herbs, eye drops, creams, and over-the-counter medicines. Any problems you or family members have had with anesthetic medicines. Any blood disorders you have. Any surgeries you have had. Any medical conditions you have. Whether you are pregnant or may be pregnant. What are the risks? Generally, this is a safe procedure. However, problems may occur, including: Allergic reactions to medicines. A blood clot that breaks free and travels to other parts of your body. The possible return of an abnormal heart rhythm within hours or days after the procedure. Your heart stopping (cardiac arrest). This is rare. What happens before the procedure? Medicines Your health care provider may have you start taking: Blood-thinning medicines (anticoagulants) so your blood does not clot as easily. Medicines to help stabilize your heart rate and rhythm. Ask your health care provider about: Changing or stopping your regular medicines. This is especially important if you are taking diabetes medicines or blood thinners. Taking medicines such as aspirin and ibuprofen. These medicines can thin your blood. Do not take these medicines unless your health care provider tells you to take them. Taking over-the-counter medicines, vitamins, herbs, and supplements. General instructions Follow instructions from your health care provider about eating or drinking restrictions. Plan to have someone take you home from the hospital or clinic. If you will be going home right after the procedure, plan to have someone with you for 24 hours. Ask your health care provider what steps will be taken to help prevent infection. These may include washing your skin with a  germ-killing soap. What happens during the procedure? An IV will be inserted into one of your veins. Sticky patches (electrodes) or metal paddles may be placed on your chest. You will be given a medicine to help you relax (sedative). An electrical shock will be delivered. The procedure may vary among health care providers and hospitals.    What can I expect after the procedure? Your blood pressure, heart rate, breathing rate, and blood oxygen level will be monitored until you leave the hospital or clinic. Your heart rhythm will be watched to make sure it does not change. You may have some redness on the skin where the shocks were given. Follow these instructions at home: Do not drive for 24 hours if you were given a sedative during your procedure. Take over-the-counter and prescription medicines only as told by your health care provider. Ask your health care provider how to check your pulse. Check it often. Rest for 48 hours after the procedure or as told by your health care provider. Avoid or limit your caffeine use as told by your health care provider. Keep all follow-up visits as told by your health care provider. This is important. Contact a health care provider if: You feel like  your heart is beating too quickly or your pulse is not regular. You have a serious muscle cramp that does not go away. Get help right away if: You have discomfort in your chest. You are dizzy or you feel faint. You have trouble breathing or you are short of breath. Your speech is slurred. You have trouble moving an arm or leg on one side of your body. Your fingers or toes turn cold or blue. Summary Electrical cardioversion is the delivery of a jolt of electricity to restore a normal rhythm to the heart. This procedure may be done right away in an emergency or may be a scheduled procedure if the condition is not an emergency. Generally, this is a safe procedure. After the procedure, check your pulse often  as told by your health care provider. This information is not intended to replace advice given to you by your health care provider. Make sure you discuss any questions you have with your health care provider. Document Revised: 12/05/2018 Document Reviewed: 12/05/2018 Elsevier Patient Education  [place].

## 2022-05-20 LAB — CBC
Hematocrit: 37.1 % (ref 34.0–46.6)
Hemoglobin: 11.4 g/dL (ref 11.1–15.9)
MCH: 24 pg — ABNORMAL LOW (ref 26.6–33.0)
MCHC: 30.7 g/dL — ABNORMAL LOW (ref 31.5–35.7)
MCV: 78 fL — ABNORMAL LOW (ref 79–97)
Platelets: 158 10*3/uL (ref 150–450)
RBC: 4.75 x10E6/uL (ref 3.77–5.28)
RDW: 15.2 % (ref 11.7–15.4)
WBC: 5.3 10*3/uL (ref 3.4–10.8)

## 2022-05-20 LAB — COMPREHENSIVE METABOLIC PANEL
ALT: 9 IU/L (ref 0–32)
AST: 21 IU/L (ref 0–40)
Albumin/Globulin Ratio: 1.2 (ref 1.2–2.2)
Albumin: 3.8 g/dL — ABNORMAL LOW (ref 3.9–4.9)
Alkaline Phosphatase: 94 IU/L (ref 44–121)
BUN/Creatinine Ratio: 13 (ref 12–28)
BUN: 15 mg/dL (ref 8–27)
Bilirubin Total: 0.6 mg/dL (ref 0.0–1.2)
CO2: 25 mmol/L (ref 20–29)
Calcium: 8.9 mg/dL (ref 8.7–10.3)
Chloride: 101 mmol/L (ref 96–106)
Creatinine, Ser: 1.17 mg/dL — ABNORMAL HIGH (ref 0.57–1.00)
Globulin, Total: 3.2 g/dL (ref 1.5–4.5)
Glucose: 143 mg/dL — ABNORMAL HIGH (ref 70–99)
Potassium: 4.1 mmol/L (ref 3.5–5.2)
Sodium: 142 mmol/L (ref 134–144)
Total Protein: 7 g/dL (ref 6.0–8.5)
eGFR: 51 mL/min/{1.73_m2} — ABNORMAL LOW (ref >59)

## 2022-05-22 NOTE — Progress Notes (Signed)
Letter was not mailed. Pt called back and Results reviewed with pt as per Dr. Julien Nordmann note.  Pt verbalized understanding and had no additional questions. Routed to PCP.

## 2022-05-29 ENCOUNTER — Ambulatory Visit: Payer: PPO | Admitting: Oncology

## 2022-05-29 ENCOUNTER — Ambulatory Visit: Payer: PPO | Attending: Cardiology

## 2022-05-29 VITALS — BP 124/52 | HR 103 | Ht 61.0 in | Wt 206.0 lb

## 2022-05-29 DIAGNOSIS — I2510 Atherosclerotic heart disease of native coronary artery without angina pectoris: Secondary | ICD-10-CM

## 2022-05-29 NOTE — Progress Notes (Signed)
   Nurse Visit   Date of Encounter: 05/29/2022 ID: Crystal Ramos, DOB 08-19-1955, MRN [id number]  PCP:  Nicoletta Dress, MD   [place] Providers Cardiologist:  Jenean Lindau, MD {    Visit Details   VS:  BP (!) 124/52 (BP Location: Left Arm, Patient Position: Sitting)   Pulse (!) 103   Ht '5\' 1"'$  (1.549 m)   Wt 206 lb (93.4 kg)   SpO2 97%   BMI 38.92 kg/m  , BMI Body mass index is 38.92 kg/m.  Wt Readings from Last 3 Encounters:  05/29/22 206 lb (93.4 kg)  05/19/22 213 lb (96.6 kg)  03/30/22 220 lb 12.8 oz (100.2 kg)     Reason for visit: EKG Performed today: Vitals, EKG, Provider consulted:Revankar, and Education Changes (medications, testing, etc.) : none Length of Visit: 15 minutes    Medications Adjustments/Labs and Tests Ordered: No orders of the defined types were placed in this encounter.  No orders of the defined types were placed in this encounter.    Signed, Truddie Hidden, RN  05/29/2022 11:04 AM

## 2022-06-01 DIAGNOSIS — I480 Paroxysmal atrial fibrillation: Secondary | ICD-10-CM | POA: Diagnosis not present

## 2022-06-01 DIAGNOSIS — Z7984 Long term (current) use of oral hypoglycemic drugs: Secondary | ICD-10-CM | POA: Diagnosis not present

## 2022-06-01 DIAGNOSIS — Z79899 Other long term (current) drug therapy: Secondary | ICD-10-CM | POA: Diagnosis not present

## 2022-06-01 DIAGNOSIS — I10 Essential (primary) hypertension: Secondary | ICD-10-CM | POA: Diagnosis not present

## 2022-06-01 DIAGNOSIS — Z7982 Long term (current) use of aspirin: Secondary | ICD-10-CM | POA: Diagnosis not present

## 2022-06-01 DIAGNOSIS — I4891 Unspecified atrial fibrillation: Secondary | ICD-10-CM | POA: Diagnosis not present

## 2022-06-01 DIAGNOSIS — I2510 Atherosclerotic heart disease of native coronary artery without angina pectoris: Secondary | ICD-10-CM | POA: Diagnosis not present

## 2022-06-01 DIAGNOSIS — Z7901 Long term (current) use of anticoagulants: Secondary | ICD-10-CM | POA: Diagnosis not present

## 2022-06-01 DIAGNOSIS — E119 Type 2 diabetes mellitus without complications: Secondary | ICD-10-CM | POA: Diagnosis not present

## 2022-06-02 ENCOUNTER — Telehealth: Payer: Self-pay | Admitting: Cardiology

## 2022-06-02 ENCOUNTER — Ambulatory Visit: Payer: PPO | Attending: Cardiology

## 2022-06-02 VITALS — BP 132/96 | HR 63 | Ht 61.0 in | Wt 209.0 lb

## 2022-06-02 DIAGNOSIS — I480 Paroxysmal atrial fibrillation: Secondary | ICD-10-CM

## 2022-06-02 NOTE — Telephone Encounter (Signed)
Patient c/o Palpitations:  High priority if patient c/o lightheadedness, shortness of breath, or chest pain  How long have you had palpitations/irregular HR/ Afib? Are you having the symptoms now? Yes  Are you currently experiencing lightheadedness, SOB or CP? No  Do you have a history of afib (atrial fibrillation) or irregular heart rhythm?   Yes  Have you checked your BP or HR? (document readings if available):   No  Are you experiencing any other symptoms? No   Patient stated she has a really light feeling in her upper arm and chest.  Patient stated she was shocked back into rhythm yesterday in hospital.

## 2022-06-02 NOTE — Patient Instructions (Signed)
  FDA-cleared personal EKG: The world's most clinically validated personal EKG, FDA-cleared to detect Atrial Fibrillation, Bradycardia, and Tachycardia. Crystal Ramos is the most reliable way to check in on your heart from home. Take your EKG from anywhere: Capture a medical-grade EKG in 30 seconds and get an instant analysis right on your smartphone. Crystal Ramos is small enough to fit in your pocket, so you can take it with you anywhere. Easy to use: Simply place your fingers on the sensors--no wires, patches, or gels. Recommended by doctors: A trusted resource, Crystal Ramos is the #1 doctor-recommended personal EKG with more than 100 million EKGs recorded. Save or share your EKGs: With the press of a button, email your EKGs to your doctor or save them on your phone. Works with smartphones: Compatible with [employer] and tablets. Check our compatibility chart. FSA/HSA eligible: Purchase using an FSA or HSA account (please confirm coverage with your insurance provider). Phone clip included with purchase, a $15 value. Conveniently take your device with you wherever you go.  https://store.[email or web address].tn   Step One- Record your EKG strip on [facility] app.   Step two- On Kardia EKG click "Download"   Step three- It will prompt you to make a password for this EKG. Please make the password "Revankar" so that we can view it.   Step four- Click on the little "upload" button (small box with an arrow in the middle) in the bottom left-hand corner of the screen.   Step five- Click "Save to Files"  Step six- Click on "On my iphone" and then "Pages" then press save in the top right-hand corner.   NOW GO TO MYCHART   Once on MyChart click "Messages"  Step one- Click "Send a message"  Step two- Click "Ask a medical question"   Step three- Click "Non urgent medical question"   Step four- Click on Rajan Revankar's name.  Step five- Click on the small  paperclip at the bottom of the screen  Step six- Click "Choose file"  Step seven- Pick the most recent EKG strip listed.   Once uploaded send the message!

## 2022-06-02 NOTE — Telephone Encounter (Signed)
Spoke with pt and made nurse visit appointment.

## 2022-06-02 NOTE — Progress Notes (Signed)
   Nurse Visit   Date of Encounter: 06/02/2022 ID: Shia Delaine, DOB Oct 19, 1955, MRN [id number]  PCP:  Nicoletta Dress, MD   [place] Providers Cardiologist:  Jenean Lindau, MD {    Visit Details   VS:  BP (!) 132/96 (BP Location: Left Arm, Patient Position: Sitting, Cuff Size: Normal)   Pulse 63   Ht '5\' 1"'$  (1.549 m)   Wt 209 lb (94.8 kg)   SpO2 98%   BMI 39.49 kg/m  , BMI Body mass index is 39.49 kg/m.  Wt Readings from Last 3 Encounters:  06/02/22 209 lb (94.8 kg)  05/29/22 206 lb (93.4 kg)  05/19/22 213 lb (96.6 kg)     Reason for visit: EKG  Performed today: Vitals, EKG, Provider consulted:Revankar, and Education Changes (medications, testing, etc.) : none; Advised to get kardia mobile. Length of Visit: 15 minutes    Medications Adjustments/Labs and Tests Ordered: No orders of the defined types were placed in this encounter.  No orders of the defined types were placed in this encounter.    Signed, Truddie Hidden, RN  06/02/2022 3:14 PM

## 2022-06-05 ENCOUNTER — Ambulatory Visit: Payer: PPO

## 2022-06-10 ENCOUNTER — Encounter: Payer: Self-pay | Admitting: Cardiology

## 2022-06-15 DIAGNOSIS — D509 Iron deficiency anemia, unspecified: Secondary | ICD-10-CM | POA: Diagnosis not present

## 2022-06-15 DIAGNOSIS — K7469 Other cirrhosis of liver: Secondary | ICD-10-CM | POA: Diagnosis not present

## 2022-06-15 DIAGNOSIS — E1169 Type 2 diabetes mellitus with other specified complication: Secondary | ICD-10-CM | POA: Diagnosis not present

## 2022-06-15 DIAGNOSIS — I10 Essential (primary) hypertension: Secondary | ICD-10-CM | POA: Diagnosis not present

## 2022-06-15 DIAGNOSIS — I480 Paroxysmal atrial fibrillation: Secondary | ICD-10-CM | POA: Diagnosis not present

## 2022-06-15 DIAGNOSIS — E785 Hyperlipidemia, unspecified: Secondary | ICD-10-CM | POA: Diagnosis not present

## 2022-06-15 DIAGNOSIS — E039 Hypothyroidism, unspecified: Secondary | ICD-10-CM | POA: Diagnosis not present

## 2022-06-25 DIAGNOSIS — D0511 Intraductal carcinoma in situ of right breast: Secondary | ICD-10-CM | POA: Diagnosis not present

## 2022-06-25 DIAGNOSIS — N6489 Other specified disorders of breast: Secondary | ICD-10-CM | POA: Diagnosis not present

## 2022-06-25 DIAGNOSIS — Z853 Personal history of malignant neoplasm of breast: Secondary | ICD-10-CM | POA: Diagnosis not present

## 2022-06-25 DIAGNOSIS — R928 Other abnormal and inconclusive findings on diagnostic imaging of breast: Secondary | ICD-10-CM | POA: Diagnosis not present

## 2022-06-29 ENCOUNTER — Other Ambulatory Visit: Payer: Self-pay | Admitting: Cardiology

## 2022-07-03 ENCOUNTER — Ambulatory Visit: Payer: PPO | Admitting: Cardiology

## 2022-07-08 DIAGNOSIS — D0511 Intraductal carcinoma in situ of right breast: Secondary | ICD-10-CM | POA: Diagnosis not present

## 2022-07-16 DIAGNOSIS — E1169 Type 2 diabetes mellitus with other specified complication: Secondary | ICD-10-CM | POA: Diagnosis not present

## 2022-07-16 DIAGNOSIS — E785 Hyperlipidemia, unspecified: Secondary | ICD-10-CM | POA: Diagnosis not present

## 2022-07-16 DIAGNOSIS — I480 Paroxysmal atrial fibrillation: Secondary | ICD-10-CM | POA: Diagnosis not present

## 2022-07-17 ENCOUNTER — Other Ambulatory Visit: Payer: Self-pay

## 2022-07-17 DIAGNOSIS — E039 Hypothyroidism, unspecified: Secondary | ICD-10-CM | POA: Diagnosis not present

## 2022-07-22 ENCOUNTER — Encounter: Payer: Self-pay | Admitting: Cardiology

## 2022-07-22 ENCOUNTER — Ambulatory Visit: Payer: PPO | Attending: Cardiology | Admitting: Cardiology

## 2022-07-22 VITALS — BP 128/76 | HR 58 | Ht 61.0 in | Wt 210.2 lb

## 2022-07-22 DIAGNOSIS — E088 Diabetes mellitus due to underlying condition with unspecified complications: Secondary | ICD-10-CM

## 2022-07-22 DIAGNOSIS — I2510 Atherosclerotic heart disease of native coronary artery without angina pectoris: Secondary | ICD-10-CM | POA: Diagnosis not present

## 2022-07-22 DIAGNOSIS — I480 Paroxysmal atrial fibrillation: Secondary | ICD-10-CM

## 2022-07-22 DIAGNOSIS — E785 Hyperlipidemia, unspecified: Secondary | ICD-10-CM | POA: Diagnosis not present

## 2022-07-22 DIAGNOSIS — I10 Essential (primary) hypertension: Secondary | ICD-10-CM | POA: Diagnosis not present

## 2022-07-22 MED ORDER — NITROGLYCERIN 0.4 MG SL SUBL: 0.4000 mg | SUBLINGUAL_TABLET | SUBLINGUAL | 11 refills | Status: DC | PRN

## 2022-07-22 MED ORDER — METOPROLOL SUCCINATE ER 25 MG PO TB24: 25.0000 mg | ORAL_TABLET | Freq: Two times a day (BID) | ORAL | 3 refills | Status: DC

## 2022-07-22 NOTE — Progress Notes (Signed)
Cardiology Office Note:    Date:  07/22/2022   ID:  Crystal Ramos, DOB April 07, 1956, MRN [id number]  PCP:  Crystal Dress, MD  Cardiologist:  Crystal Lindau, MD   Referring MD: Crystal Dress, MD    ASSESSMENT:    1. Paroxysmal atrial fibrillation (HCC)   2. Coronary artery disease involving native coronary artery of native heart without angina pectoris   3. Essential hypertension   4. Diabetes mellitus due to underlying condition with unspecified complications ([place])   5. Morbid obesity ([place])   6. Dyslipidemia    PLAN:    In order of problems listed above:  Coronary artery disease: Secondary prevention stressed with the patient.  Importance of compliance with diet medication stressed and she vocalized understanding.  She was advised to walk to the best of her ability and be ambulatory. Paroxysmal atrial fibrillation:I discussed with the patient atrial fibrillation, disease process. Management and therapy including rate and rhythm control, anticoagulation benefits and potential risks were discussed extensively with the patient. Patient had multiple questions which were answered to patient's satisfaction. Amiodarone therapy: Benefits and potential risks explained and she vocalized understanding and questions were answered to her satisfaction. Essential hypertension: Blood pressure stable and diet was emphasized.  I cut down beta-blocker to half dose.  Hopefully this will help the first-degree AV block and slow heart rate also. Mixed dyslipidemia, diabetes mellitus and morbid obesity: Weight reduction stressed diet emphasized.  Lifestyle modification urged.  She promises to do better. She will have blood work today.Patient will be seen in follow-up appointment in 6 months or earlier if the patient has any concerns    Medication Adjustments/Labs and Tests Ordered: Current medicines are reviewed at length with the patient today.  Concerns regarding medicines are outlined  above.  Orders Placed This Encounter  Procedures   Comprehensive metabolic panel   EKG [id number]   Meds ordered this encounter  Medications   nitroGLYCERIN (NITROSTAT) 0.4 MG SL tablet    Sig: Place 1 tablet (0.4 mg total) under the tongue every 5 (five) minutes as needed for chest pain.    Dispense:  25 tablet    Refill:  11   metoprolol succinate (TOPROL-XL) 25 MG 24 hr tablet    Sig: Take 1 tablet (25 mg total) by mouth 2 (two) times daily.    Dispense:  180 tablet    Refill:  3     No chief complaint on file.    History of Present Illness:    Crystal Ramos is a 67 y.o. female.  Patient has past medical history of coronary artery disease, paroxysmal atrial fibrillation, essential hypertension, mixed dyslipidemia and diabetes mellitus.  She denies any problems at this time and takes care of activities of daily living.  She has undergone cardioversion and now maintaining sinus rhythm.  At the time of my evaluation, the patient is alert awake oriented and in no distress.  She leads a sedentary lifestyle.  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   Dyslipidemia 02/25/2017   Dyspnea on exertion 12/18/2015   Epistaxis 06/26/2021   Essential hypertension    Hematuria 11/29/2017   Morbid obesity ([place]) 02/25/2017   Osteopenia after menopause 12/01/2019   Paroxysmal atrial fibrillation ([place])  02/25/2017   Psoriasis 07/31/2020   Type 2 diabetes mellitus without complication ([place]) 99991111    Past Surgical History:  Procedure Laterality Date   CHOLECYSTECTOMY     LEFT HEART CATH AND CORONARY ANGIOGRAPHY N/A 04/03/2019   Procedure: LEFT HEART CATH AND CORONARY ANGIOGRAPHY;  Surgeon: Crystal Blanks, MD;  Location: [facility];  Service: Cardiovascular;  Laterality: N/A;   MEDIAL PARTIAL KNEE REPLACEMENT      Current Medications: Current Meds  Medication Sig   ALPRAZolam (XANAX) 0.25 MG tablet Take 0.25 mg by mouth at bedtime.    amiodarone (PACERONE) 200 MG tablet Take 0.5 tablets (100 mg total) by mouth 2 (two) times daily.   aspirin EC 81 MG tablet Take 81 mg by mouth daily.   Bempedoic Acid-Ezetimibe (NEXLIZET) 180-10 MG TABS Take 1 tablet by mouth daily.   canagliflozin (INVOKANA) 300 MG TABS tablet Take 300 mg by mouth daily before breakfast.   ezetimibe (ZETIA) 10 MG tablet Take 10 mg by mouth daily.   Ferrous Sulfate (IRON) 325 (65 Fe) MG TABS Take 1 tablet by mouth daily.   furosemide (LASIX) 40 MG tablet Take 40 mg by mouth 2 (two) times daily.   JARDIANCE 25 MG TABS tablet Take 25 mg by mouth daily.   levothyroxine (SYNTHROID) 100 MCG tablet Take 100 mcg by mouth daily.   meclizine (ANTIVERT) 25 MG tablet Take 25 mg by mouth 3 (three) times daily.    metFORMIN (GLUCOPHAGE) 850 MG tablet Take 850 mg by mouth 3 (three) times daily.   raloxifene (EVISTA) 60 MG tablet Take 1 tablet (60 mg total) by mouth daily.   rivaroxaban (XARELTO) 20 MG TABS tablet Take 20 mg by mouth daily with supper.   tiZANidine (ZANAFLEX) 4 MG tablet Take 4 mg by mouth 3 (three) times daily as needed for muscle spasms.   traMADol (ULTRAM) 50 MG tablet Take 50 mg by mouth 4 (four) times daily as needed for pain.   [DISCONTINUED] metoprolol succinate (TOPROL-XL) 50 MG 24 hr tablet Take 50 mg by mouth 2 (two) times daily.   [DISCONTINUED] nitroGLYCERIN (NITROSTAT) 0.4 MG SL tablet  Place 0.4 mg under the tongue every 5 (five) minutes as needed for chest pain.     Allergies:   Patient has no known allergies.   Social History   Socioeconomic History   Marital status: Married    Spouse name: Not on file   Number of children: Not on file   Years of education: Not on file   Highest education level: Not on file  Occupational History   Not on file  Tobacco Use   Smoking status: Never    Passive exposure: Yes   Smokeless tobacco: Never   Tobacco comments:    "whole family" smoked in the house  Vaping Use   Vaping Use: Never used  Substance and Sexual Activity   Alcohol use: No   Drug use: No   Sexual activity: Not on file  Other Topics Concern   Not on file  Social History Narrative   Not on file   Social Determinants of Health   Financial Resource Strain:  Not on file  Food Insecurity: Not on file  Transportation Needs: Not on file  Physical Activity: Not on file  Stress: Not on file  Social Connections: Not on file     Family History: The patient's family history includes Breast cancer in her paternal aunt; Diabetes in her mother; Stroke in her father and paternal aunt.  ROS:   Please see the history of present illness.    All other systems reviewed and are negative.  EKGs/Labs/Other Studies Reviewed:    The following studies were reviewed today: EKG reveals sinus rhythm bradycardia first-degree AV block and nonspecific ST-T changes   Recent Labs: 05/19/2022: ALT 9; BUN 15; Creatinine, Ser 1.17; Hemoglobin 11.4; Platelets 158; Potassium 4.1; Sodium 142  Recent Lipid Panel    Component Value Date/Time   CHOL 164 01/12/2022 0904   TRIG 69 01/12/2022 0904   HDL 56 01/12/2022 0904   CHOLHDL 2.9 01/12/2022 0904   LDLCALC 95 01/12/2022 0904    Physical Exam:    VS:  BP 128/76   Pulse (!) 58   Ht '5\' 1"'$  (1.549 m)   Wt 210 lb 3.2 oz (95.3 kg)   SpO2 96%   BMI 39.72 kg/m     Wt Readings from Last 3 Encounters:  07/22/22 210 lb 3.2 oz  (95.3 kg)  06/02/22 209 lb (94.8 kg)  05/29/22 206 lb (93.4 kg)     GEN: Patient is in no acute distress HEENT: Normal NECK: No JVD; No carotid bruits LYMPHATICS: No lymphadenopathy CARDIAC: Hear sounds regular, 2/6 systolic murmur at the apex. RESPIRATORY:  Clear to auscultation without rales, wheezing or rhonchi  ABDOMEN: Soft, non-tender, non-distended MUSCULOSKELETAL:  No edema; No deformity  SKIN: Warm and dry NEUROLOGIC:  Alert and oriented x 3 PSYCHIATRIC:  Normal affect   Signed, Crystal Lindau, MD  07/22/2022 9:54 AM    [place]

## 2022-07-22 NOTE — Patient Instructions (Signed)
Medication Instructions:  Your physician has recommended you make the following change in your medication:   Decrease your Metoprolol to 25 mg twice daily.  *If you need a refill on your cardiac medications before your next appointment, please call your pharmacy*   Lab Work: None ordered If you have labs (blood work) drawn today and your tests are completely normal, you will receive your results only by: [place] (if you have MyChart) OR A paper copy in the mail If you have any lab test that is abnormal or we need to change your treatment, we will call you to review the results.   Testing/Procedures: None ordered   Follow-Up: At [facility], you and your health needs are our priority.  As part of our continuing mission to provide you with exceptional heart care, we have created designated Provider Care Teams.  These Care Teams include your primary Cardiologist (physician) and Advanced Practice Providers (APPs -  Physician Assistants and Nurse Practitioners) who all work together to provide you with the care you need, when you need it.  We recommend signing up for the patient portal called "MyChart".  Sign up information is provided on this After Visit Summary.  MyChart is used to connect with patients for Virtual Visits (Telemedicine).  Patients are able to view lab/test results, encounter notes, upcoming appointments, etc.  Non-urgent messages can be sent to your provider as well.   To learn more about what you can do with MyChart, go to [email or web address].    Your next appointment:   6 month(s)  The format for your next appointment:   In Person  Provider:   Jyl Heinz, MD    Other Instructions none  Important Information About Sugar

## 2022-07-23 LAB — COMPREHENSIVE METABOLIC PANEL
ALT: 17 IU/L (ref 0–32)
AST: 28 IU/L (ref 0–40)
Albumin/Globulin Ratio: 1.1 — ABNORMAL LOW (ref 1.2–2.2)
Albumin: 3.4 g/dL — ABNORMAL LOW (ref 3.9–4.9)
Alkaline Phosphatase: 128 IU/L — ABNORMAL HIGH (ref 44–121)
BUN/Creatinine Ratio: 13 (ref 12–28)
BUN: 11 mg/dL (ref 8–27)
Bilirubin Total: 0.6 mg/dL (ref 0.0–1.2)
CO2: 25 mmol/L (ref 20–29)
Calcium: 8.7 mg/dL (ref 8.7–10.3)
Chloride: 104 mmol/L (ref 96–106)
Creatinine, Ser: 0.88 mg/dL (ref 0.57–1.00)
Globulin, Total: 3.2 g/dL (ref 1.5–4.5)
Glucose: 110 mg/dL — ABNORMAL HIGH (ref 70–99)
Potassium: 3.8 mmol/L (ref 3.5–5.2)
Sodium: 142 mmol/L (ref 134–144)
Total Protein: 6.6 g/dL (ref 6.0–8.5)
eGFR: 72 mL/min/{1.73_m2} (ref >59)

## 2022-09-14 DIAGNOSIS — E785 Hyperlipidemia, unspecified: Secondary | ICD-10-CM | POA: Diagnosis not present

## 2022-09-14 DIAGNOSIS — I480 Paroxysmal atrial fibrillation: Secondary | ICD-10-CM | POA: Diagnosis not present

## 2022-09-14 DIAGNOSIS — E1169 Type 2 diabetes mellitus with other specified complication: Secondary | ICD-10-CM | POA: Diagnosis not present

## 2022-09-14 DIAGNOSIS — E039 Hypothyroidism, unspecified: Secondary | ICD-10-CM | POA: Diagnosis not present

## 2022-09-14 DIAGNOSIS — I10 Essential (primary) hypertension: Secondary | ICD-10-CM | POA: Diagnosis not present

## 2022-09-14 DIAGNOSIS — D509 Iron deficiency anemia, unspecified: Secondary | ICD-10-CM | POA: Diagnosis not present

## 2022-09-25 NOTE — Progress Notes (Signed)
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  She was treated with lumpectomy.  Estrogen and progesterone receptors were highly positive.  She completed adjuvant radiation in June.  She was placed on chemoprevention with raloxifene 60 mg daily in July 2021, which is planned for 5 years.  She continues to tolerate this without difficulty.  She remains without evidence of recurrence.  She knows to continue raloxifene daily.      Osteopenia Bone density scan from November 24, 2019 reveals mild osteopenia of the femur and spine which is relatively stable. She had a repeat bone density in December, 2023 which revealed worsening osteopenia. The T-score of the spine went from -1.2 to -1.6 and the femur from -1.6 to -2.0. I instructed her to start calcium with vitamin D twice daily and wrote this down for her.   Plan She had a fall and broke her right shoulder in November, 2022. Her bone density scan in December, 2023 has worsened but is still osteopenia, the spine T-score went from -1.2 to -1.6 and the femur from -1.6 to -2.0. I advised her to take calcium and vitamin D supplements twice daily. She continues raloxifene daily without difficulty, this is for chemo prevention rather than treatment of her osteopenia, but hopefully she may get some benefit. She had a diagnostic bilateral mammogram on 06/25/2022 that revealed subtle possible distortion in the lateral aspect of the left breast with no evidence of left axillary lymphadenopathy and a stereotactic biopsy was recommended for the left distortion. Multiple attempts were made for her stereotactic biopsy and was therefore canceled and rescheduled for a follow-up  diagnostic mammogram in 6 months. Dr. Tomasa Blase performs blood work for her. I will see her back in 1 year for reevaluation. She won't be due for another bone density until the end of 2025. The patient understands the plans discussed today and is in agreement with them.  She knows to contact our office if she develops concerns prior to her next appointment.  I provided 20 minutes of face-to-face time during this encounter and > 50% was spent counseling as documented under my assessment and plan.    Crystal Beckwith, MD  [facility] [address] [place] [address] Dept: [phone] Dept Fax: [phone]   No orders of the defined types were placed in this encounter.     CHIEF COMPLAINT:  CC: Ductal carcinoma in situ of the right breast  Current Treatment: Chemoprevention with raloxifene 60 mg daily  HISTORY OF PRESENT ILLNESS:  Crystal Ramos is a 67 y.o. female with stage 0 right breast cancer diagnosed in March 2021.  Annual mammography in December 2021 revealed suspicious calcifications within the right breast.  Unilateral right mammogram in February confirmed suspicious developing calcifications in the upper inner quadrant of the right breast spanning an area of 1 cm.  Stereotactic biopsy in March revealed high grade ductal carcinoma in situ with comedo necrosis.  No invasive carcinoma was identified.  Bilateral breast MRI  was negative for additional sites of malignancy.  She underwent right breast lumpectomy and attempted sentinel lymph node biopsy by Dr. Lequita Halt.  Surgical pathology revealed ductal carcinoma  in situ, grade 3, measuring 12 mm in extent.  Margins were free of neoplasm.  No invasive carcinoma identified.  Estrogen receptor was 60% positive and progesterone receptor was 70% positive.  She completed adjuvant radiation with a 60 Gy course to the right breast in early June.  She was placed on  chemoprevention with raloxifene in July 2021.  She has tolerated this without difficulty. She started menarche at age 27, and has had 2 children with her first child at age 20.  She went through menopause at age 45-50, and was not placed on hormones.  She has psoriasis, which is treated with injections through the dermatologist.  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   08/01/2019 Cancer Staging   Staging form: Breast, AJCC 8th Edition - Clinical stage from 08/01/2019: Stage 0 (cTis (DCIS), cN0, cM0, G3, ER+, PR+, HER2: Not Assessed) - Signed by Crystal Beckwith, MD on 07/31/2020 Histopathologic type: Intraductal carcinoma, noninfiltrating, NOS Method of lymph node assessment: Clinical Nuclear grade: G3 Multigene prognostic tests performed: None Histologic grading system: 3 grade system Menopausal status: Postmenopausal Stage used in treatment planning: Yes National guidelines used in treatment planning: Yes Type of national guideline used in treatment planning: NCCN Staging comments: 12 mm, lumpectomy and XRT, chemoprevention with raloxifene       INTERVAL HISTORY:  Crystal Ramos is here today for repeat clinical assessment for ductal carcinoma in situ of the right breast. Patient states that she feels well and has no complaints of pain. The patient informed me that both her arms have decreased ROM due to prior injuries, she can't raise them completely above her head or behind her back. She had a fall and broke her right shoulder in November, 2022. Her bone density scan in December, 2023 has worsened but is still osteopenia, the spine T-score went from -1.2 to -1.6 and the femur from -1.6 to -2.0. I advised her to take calcium and vitamin D supplements twice daily. She continues raloxifene daily without difficulty, this is for chemoprevention rather  than treatment of her osteopenia, but hopefully she may get some benefit. She had a diagnostic bilateral mammogram on 06/25/2022 that revealed subtle possible distortion in the lateral aspect of the left breast with no evidence of left axillary lymphadenopathy and a stereotactic biopsy was recommended for the left distortion. Multiple attempts were made for her stereotactic biopsy and were unsuccessful. It was therefore canceled and rescheduled for a follow-up diagnostic mammogram in 6 months. Dr. Tomasa Blase performs blood work for her. I will see her back in 1 year for reevaluation. She won't be due for another bone density until the end of 2025. She denies signs of infection such as sore throat, sinus drainage, cough, or urinary symptoms.  She denies fevers or recurrent chills. She denies pain. She denies nausea, vomiting, chest pain, dyspnea or cough. Her appetite is good and her weight has increased 6 pounds over last 2 months .  REVIEW OF SYSTEMS:  Review of Systems  Constitutional: Negative.  Negative for appetite change, chills, diaphoresis, fatigue, fever and unexpected weight change.  HENT:  Negative.  Negative for hearing loss, lump/mass, mouth sores, nosebleeds, sore throat, tinnitus, trouble swallowing and voice change.   Eyes: Negative.  Negative for eye problems and icterus.  Respiratory: Negative.  Negative for chest tightness, cough, hemoptysis, shortness of breath and wheezing.   Cardiovascular:  Negative.  Negative for chest pain, leg swelling and palpitations.  Gastrointestinal: Negative.  Negative for abdominal distention, abdominal pain, blood in stool, constipation, diarrhea, nausea, rectal pain and vomiting.  Endocrine: Negative.  Negative for hot flashes.  Genitourinary: Negative.  Negative for bladder incontinence, difficulty urinating, dyspareunia, dysuria, frequency, hematuria, menstrual problem, nocturia, pelvic pain, vaginal bleeding and vaginal discharge.   Musculoskeletal:  Negative.  Negative for arthralgias, back pain, flank pain, gait problem, myalgias, neck pain and neck stiffness.  Skin: Negative.  Negative for itching, rash and wound.  Neurological:  Negative for dizziness, extremity weakness, gait problem, headaches, light-headedness, numbness, seizures and speech difficulty.  Hematological: Negative.  Negative for adenopathy. Does not bruise/bleed easily.  Psychiatric/Behavioral: Negative.  Negative for confusion, decreased concentration, depression, sleep disturbance and suicidal ideas. The patient is not nervous/anxious.        Easily forgetful     VITALS:  Blood pressure (!) 132/52, pulse 75, temperature 98.5 F (36.9 C), temperature source Oral, resp. rate 18, height 5\' 1"  (1.549 m), weight 216 lb 8 oz (98.2 kg), SpO2 96 %.  Wt Readings from Last 3 Encounters:  09/29/22 216 lb 8 oz (98.2 kg)  07/22/22 210 lb 3.2 oz (95.3 kg)  06/02/22 209 lb (94.8 kg)    Body mass index is 40.91 kg/m.  Performance status (ECOG): 0 - Asymptomatic  PHYSICAL EXAM:  Physical Exam Vitals and nursing note reviewed.  Constitutional:      General: She is not in acute distress.    Appearance: Normal appearance. She is normal weight. She is not ill-appearing, toxic-appearing or diaphoretic.  HENT:     Head: Normocephalic and atraumatic.     Right Ear: Tympanic membrane, ear canal and external ear normal. There is no impacted cerumen.     Left Ear: Tympanic membrane, ear canal and external ear normal. There is no impacted cerumen.     Nose: Nose normal. No congestion or rhinorrhea.     Mouth/Throat:     Mouth: Mucous membranes are moist.     Pharynx: Oropharynx is clear. No oropharyngeal exudate or posterior oropharyngeal erythema.  Eyes:     General: No scleral icterus.       Right eye: No discharge.        Left eye: No discharge.     Extraocular Movements: Extraocular movements intact.     Conjunctiva/sclera: Conjunctivae normal.     Pupils: Pupils are equal,  round, and reactive to light.  Neck:     Vascular: No carotid bruit.  Cardiovascular:     Rate and Rhythm: Normal rate and regular rhythm.     Heart sounds: Murmur heard.     Systolic murmur is present with a grade of 2/6.     No friction rub. No gallop.  Pulmonary:     Effort: Pulmonary effort is normal. No respiratory distress.     Breath sounds: Normal breath sounds. No stridor. No wheezing, rhonchi or rales.  Chest:     Chest wall: No tenderness.  Breasts:    Right: Normal. No swelling, bleeding, inverted nipple, mass, nipple discharge, skin change or tenderness.     Left: Normal. No swelling, bleeding, inverted nipple, mass, nipple discharge, skin change or tenderness.     Comments: Well healed scar that barely visibile in the upper right breast at about 12 o'clock.  Both breasts are without masses   Abdominal:     General: Bowel sounds are normal. There is no distension.  Palpations: Abdomen is soft. There is no hepatomegaly, splenomegaly or mass.     Tenderness: There is no abdominal tenderness. There is no right CVA tenderness, left CVA tenderness, guarding or rebound.     Hernia: No hernia is present.     Comments: Scar in the upper abdomen  Musculoskeletal:        General: No swelling, tenderness, deformity or signs of injury. Normal range of motion.     Cervical back: Normal range of motion and neck supple. No rigidity or tenderness.     Right lower leg: 1+ Edema (1+) present.     Left lower leg: 1+ Edema (1+) present.     Comments: L>R  Lymphadenopathy:     Cervical: No cervical adenopathy.     Upper Body:     Right upper body: No supraclavicular or axillary adenopathy.     Left upper body: No supraclavicular or axillary adenopathy.     Lower Body: No right inguinal adenopathy. No left inguinal adenopathy.  Skin:    General: Skin is warm and dry.     Coloration: Skin is not jaundiced or pale.     Findings: Bruising present. No erythema, lesion or rash.   Neurological:     General: No focal deficit present.     Mental Status: She is alert and oriented to person, place, and time. Mental status is at baseline.     Cranial Nerves: No cranial nerve deficit.     Sensory: No sensory deficit.     Motor: No weakness.     Coordination: Coordination normal.     Gait: Gait normal.     Deep Tendon Reflexes: Reflexes normal.  Psychiatric:        Mood and Affect: Mood normal.        Behavior: Behavior normal.        Thought Content: Thought content normal.        Judgment: Judgment normal.    LABS:      Latest Ref Rng & Units 05/19/2022    3:37 PM 03/27/2019   12:38 PM 06/07/2017    9:12 AM  CBC  WBC 3.4 - 10.8 x10E3/uL 5.3  6.1  5.7   Hemoglobin 11.1 - 15.9 g/dL 16.1  09.6  04.5   Hematocrit 34.0 - 46.6 % 37.1  41.9  41.8   Platelets 150 - 450 x10E3/uL 158  185  140       Latest Ref Rng & Units 07/22/2022   10:00 AM 05/19/2022    3:37 PM 01/12/2022    9:04 AM  CMP  Glucose 70 - 99 mg/dL 409  811  914   BUN 8 - 27 mg/dL 11  15  10    Creatinine 0.57 - 1.00 mg/dL 7.82  9.56  2.13   Sodium 134 - 144 mmol/L 142  142  141   Potassium 3.5 - 5.2 mmol/L 3.8  4.1  4.6   Chloride 96 - 106 mmol/L 104  101  104   CO2 20 - 29 mmol/L 25  25  26    Calcium 8.7 - 10.3 mg/dL 8.7  8.9  9.3   Total Protein 6.0 - 8.5 g/dL 6.6  7.0  6.9   Total Bilirubin 0.0 - 1.2 mg/dL 0.6  0.6  0.5   Alkaline Phos 44 - 121 IU/L 128  94  122   AST 0 - 40 IU/L 28  21  22    ALT 0 - 32 IU/L 17  9  10      No results found for: "CEA1", "CEA" / No results found for: "CEA1", "CEA" No results found for: "PSA1" No results found for: "WUJ811" No results found for: "CAN125"  No results found for: "TOTALPROTELP", "ALBUMINELP", "A1GS", "A2GS", "BETS", "BETA2SER", "GAMS", "MSPIKE", "SPEI" No results found for: "TIBC", "FERRITIN", "IRONPCTSAT" No results found for: "LDH"  STUDIES:  No results found.  Exam: 06/25/2022 Digital Diagnostic Bilateral Mammogram with Tomosynthesis;  Ultrasound Left Breast Impression:  Subtle possible distortion in the lateral aspect of the left breast. No evidence of left axillary lymphadenopathy  Stable right breast lumpectomy. No evidence of right breast malignancy.       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   Dyslipidemia 02/25/2017   Dyspnea on exertion 12/18/2015   Epistaxis 06/26/2021   Essential hypertension    Hematuria 11/29/2017   Morbid obesity (HCC) 02/25/2017   Osteopenia after menopause 12/01/2019   Paroxysmal atrial fibrillation (HCC) 02/25/2017   Psoriasis 07/31/2020   Type 2 diabetes mellitus without complication (HCC) 12/18/2015    Past Surgical History:  Procedure Laterality Date   CHOLECYSTECTOMY     LEFT HEART CATH AND CORONARY ANGIOGRAPHY N/A 04/03/2019   Procedure: LEFT HEART CATH AND CORONARY ANGIOGRAPHY;  Surgeon: Kathleene Hazel, MD;  Location: [facility];  Service: Cardiovascular;  Laterality: N/A;   MEDIAL PARTIAL KNEE REPLACEMENT      Family History  Problem Relation Age of Onset   Diabetes Mother    Stroke Father    Stroke Paternal Aunt    Breast cancer Paternal Aunt     Social History:  reports that she has never smoked. She has been exposed to tobacco smoke. She has never used smokeless tobacco. She reports that she does not drink alcohol and does not use drugs.The patient is  alone today.  Allergies: No Known Allergies  Current Medications: Current Outpatient Medications  Medication Sig Dispense Refill   ALPRAZolam (XANAX) 0.25 MG tablet Take 0.25 mg by mouth at bedtime.      amiodarone (PACERONE) 200 MG tablet Take 0.5 tablets (100 mg total) by mouth 2 (two) times daily. 90 tablet 2   aspirin EC 81 MG tablet Take 81 mg by mouth daily.     Bempedoic Acid-Ezetimibe (NEXLIZET) 180-10 MG TABS Take 1 tablet by mouth daily. 90 tablet 3   canagliflozin (INVOKANA) 300 MG TABS tablet Take 300 mg by mouth daily before breakfast.     ezetimibe (ZETIA) 10 MG tablet Take 10 mg by mouth daily.     Ferrous Sulfate (IRON) 325 (65 Fe) MG TABS Take 1 tablet by mouth daily.     furosemide (LASIX) 40 MG tablet Take 40 mg by mouth 2 (two) times daily.     JARDIANCE 25 MG TABS tablet Take 25 mg by mouth daily.     levothyroxine (SYNTHROID) 100 MCG tablet Take 100 mcg by mouth daily.  meclizine (ANTIVERT) 25 MG tablet Take 25 mg by mouth 3 (three) times daily.      metFORMIN (GLUCOPHAGE) 850 MG tablet Take 850 mg by mouth 3 (three) times daily.     metoprolol succinate (TOPROL-XL) 25 MG 24 hr tablet Take 1 tablet (25 mg total) by mouth 2 (two) times daily. 180 tablet 3   nitroGLYCERIN (NITROSTAT) 0.4 MG SL tablet Place 1 tablet (0.4 mg total) under the tongue every 5 (five) minutes as needed for chest pain. 25 tablet 11   raloxifene (EVISTA) 60 MG tablet Take 1 tablet (60 mg total) by mouth daily. 30 tablet 5   rivaroxaban (XARELTO) 20 MG TABS tablet Take 20 mg by mouth daily with supper.     tiZANidine (ZANAFLEX) 4 MG tablet Take 4 mg by mouth 3 (three) times daily as needed for muscle spasms.     traMADol (ULTRAM) 50 MG tablet Take 50 mg by mouth 4 (four) times daily as needed for pain.     No current facility-administered medications for this visit.     I,Jasmine M Lassiter,acting as a scribe for Crystal Beckwith, MD.,have documented all relevant documentation on the  behalf of Crystal Beckwith, MD,as directed by  Crystal Beckwith, MD while in the presence of Crystal Beckwith, MD.

## 2022-09-28 ENCOUNTER — Ambulatory Visit: Payer: PPO | Admitting: Oncology

## 2022-09-29 ENCOUNTER — Inpatient Hospital Stay: Payer: PPO | Attending: Oncology | Admitting: Oncology

## 2022-09-29 ENCOUNTER — Encounter: Payer: Self-pay | Admitting: Oncology

## 2022-09-29 VITALS — BP 132/52 | HR 75 | Temp 98.5°F | Resp 18 | Ht 61.0 in | Wt 216.5 lb

## 2022-09-29 DIAGNOSIS — D0511 Intraductal carcinoma in situ of right breast: Secondary | ICD-10-CM

## 2022-09-29 DIAGNOSIS — M8580 Other specified disorders of bone density and structure, unspecified site: Secondary | ICD-10-CM | POA: Diagnosis not present

## 2022-09-29 DIAGNOSIS — Z780 Asymptomatic menopausal state: Secondary | ICD-10-CM | POA: Diagnosis not present

## 2022-09-30 ENCOUNTER — Telehealth: Payer: Self-pay | Admitting: Oncology

## 2022-09-30 NOTE — Telephone Encounter (Signed)
Contacted pt to schedule an appt. Unable to reach via phone, voicemail was left.   Scheduling Message Entered by Gery Pray H on 09/29/2022 at 11:03 AM Priority: Routine <No visit type provided>  Department: [facility]  Provider:  Scheduling Notes:  RT 1 year

## 2022-10-02 ENCOUNTER — Telehealth: Payer: Self-pay | Admitting: Oncology

## 2022-10-02 NOTE — Telephone Encounter (Signed)
Contacted pt to schedule an appt. Unable to reach via phone, voicemail was left.   Scheduling Message Entered by MCCARTY, CHRISTINE H on 09/29/2022 at 11:03 AM Priority: Routine <No visit type provided>  Department: [facility]  Provider:  Scheduling Notes:  RT 1 year   

## 2022-10-09 DIAGNOSIS — E119 Type 2 diabetes mellitus without complications: Secondary | ICD-10-CM | POA: Diagnosis not present

## 2022-10-23 ENCOUNTER — Other Ambulatory Visit: Payer: Self-pay | Admitting: Oncology

## 2022-10-23 DIAGNOSIS — D0511 Intraductal carcinoma in situ of right breast: Secondary | ICD-10-CM

## 2022-11-13 DIAGNOSIS — H25813 Combined forms of age-related cataract, bilateral: Secondary | ICD-10-CM | POA: Diagnosis not present

## 2022-11-13 DIAGNOSIS — H25811 Combined forms of age-related cataract, right eye: Secondary | ICD-10-CM | POA: Diagnosis not present

## 2022-11-13 DIAGNOSIS — Z01818 Encounter for other preprocedural examination: Secondary | ICD-10-CM | POA: Diagnosis not present

## 2022-11-18 DIAGNOSIS — I5033 Acute on chronic diastolic (congestive) heart failure: Secondary | ICD-10-CM | POA: Diagnosis not present

## 2022-11-20 ENCOUNTER — Encounter: Payer: Self-pay | Admitting: Cardiology

## 2022-11-20 ENCOUNTER — Ambulatory Visit: Payer: PPO | Attending: Cardiology | Admitting: Cardiology

## 2022-11-20 VITALS — BP 124/84 | HR 64 | Ht 61.0 in | Wt 213.8 lb

## 2022-11-20 DIAGNOSIS — I10 Essential (primary) hypertension: Secondary | ICD-10-CM | POA: Diagnosis not present

## 2022-11-20 DIAGNOSIS — I2510 Atherosclerotic heart disease of native coronary artery without angina pectoris: Secondary | ICD-10-CM | POA: Diagnosis not present

## 2022-11-20 DIAGNOSIS — I480 Paroxysmal atrial fibrillation: Secondary | ICD-10-CM

## 2022-11-20 DIAGNOSIS — I5030 Unspecified diastolic (congestive) heart failure: Secondary | ICD-10-CM | POA: Diagnosis not present

## 2022-11-20 NOTE — Progress Notes (Signed)
Cardiology Office Note:  .   Date:  11/20/2022  ID:  Helane Vanroy, DOB [birth year], MRN [id number] PCP: Paulina Fusi, MD  [facility] Providers Cardiologist:  Garwin Brothers, MD    History of Present Illness: Marland Kitchen   Ceil Blauer is a 67 y.o. female with a past medical history of hypertension, coronary artery calcification noted on CT imaging, PAF, DM 2, chronic diastolic heart failure, history of breast cancer, dyslipidemia, moderate aortic valve stenosis.  06/01/2022 DCCV x 2 at 200 J which was successful 08/26/2021 echo EF 55 to 60%, trivial MR, mild calcification in the aortic valve, mild thickening of the aortic valve, mild to moderate AAS 10/16/2020 MPI at [facility], negative 04/03/2019 cardiac catheterization mild to moderate nonobstructive disease in the mid LAD, mild nonobstructive disease in the intermediate branch in the circumflex, small RV marginal branch 95% stenosis was too small for PCI 01/18/2019 carotid ultrasound bilateral mild carotid artery stenosis  Most recently evaluated Dr. Tomie China on 07/22/2022, was maintained in sinus rhythm.   She presents today after recently being evaluated by her PCP for seemingly new pedal edema and DOE.  He stopped her Lasix and started her on torsemide, she has not noticed any improvement with diuresing.  She states she has been having progressive shortness of breath with minimal exertion.  There has not been any drastic change in her weight. She denies chest pain, palpitations, dyspnea, pnd, orthopnea, n, v, dizziness, syncope, edema, weight gain, or early satiety.   ROS: Review of Systems  Constitutional:  Positive for malaise/fatigue.  HENT: Negative.    Eyes: Negative.   Respiratory:  Positive for shortness of breath.   Cardiovascular:  Positive for orthopnea and leg swelling. Negative for chest pain and PND.  Gastrointestinal: Negative.   Genitourinary: Negative.   Musculoskeletal: Negative.   Skin:  Negative.   Neurological: Negative.   Endo/Heme/Allergies: Negative.   Psychiatric/Behavioral: Negative.       Studies Reviewed: Marland Kitchen   EKG Interpretation Date/Time:  Friday November 20 2022 14:39:28 EDT Ventricular Rate:  64 PR Interval:  240 QRS Duration:  82 QT Interval:  478 QTC Calculation: 493 R Axis:   115  Text Interpretation: Sinus rhythm with 1st degree A-V block Right axis deviation Cannot rule out Anterior infarct , age undetermined Abnormal ECG When compared with ECG of 23-Jun-2004 14:53, PR interval has increased QRS axis Shifted right T wave amplitude has increased in Lateral leads QT has lengthened Confirmed by Wallis Bamberg [phone]) on 11/20/2022 2:45:52 PM   Cardiac Studies & Procedures   CARDIAC CATHETERIZATION  CARDIAC CATHETERIZATION 04/03/2019  Narrative  RV Branch lesion is 95% stenosed.  Ost Cx to Prox Cx lesion is 30% stenosed.  Ramus lesion is 30% stenosed.  Mid LAD-1 lesion is 30% stenosed.  Mid LAD-2 lesion is 50% stenosed.  Mid LAD to Dist LAD lesion is 30% stenosed.  1. Mild to moderate non-obstructive disease in the mid LAD 2. Mild non-obstructive disease in the intermediate branch and the Circumflex 3. The RCA is a large dominant vessel with no obstructive in the major segment or distal branches. The small RV marginal branch has a 95% ostial stenosis (too small for PCI). 4. Elevated LVEDP  Recommendations: Medical management of CAD. I would increase her Lasix dosage to 40 mg po BID. She was given Lasix 40 mg IV x 1 in the cath lab.  Findings Coronary Findings Diagnostic  Dominance: Right  Left Anterior Descending Vessel  is large. Mid LAD-1 lesion is 30% stenosed. Mid LAD-2 lesion is 50% stenosed. Mid LAD to Dist LAD lesion is 30% stenosed.  Ramus Intermedius Vessel is moderate in size. Ramus lesion is 30% stenosed.  Left Circumflex Vessel is large. Ost Cx to Prox Cx lesion is 30% stenosed.  Right Coronary Artery Vessel is  large.  Right Ventricular Branch RV Branch lesion is 95% stenosed.  Intervention  No interventions have been documented.     ECHOCARDIOGRAM  ECHOCARDIOGRAM COMPLETE 08/26/2021  Narrative ECHOCARDIOGRAM REPORT    Patient Name:   Morganne ANN COX Knippel Date of Exam: 08/26/2021 Medical Rec #:  [id number]            Height:       61.0 in Accession #:    [id number]           Weight:       202.8 lb Date of Birth:  1955/05/27            BSA:          1.900 m Patient Age:    65 years             BP:           132/62 mmHg Patient Gender: F                    HR:           80 bpm. Exam Location:  [facility]  Procedure: 2D Echo, Cardiac Doppler and Color Doppler  Indications:    Atherosclerotic vascular disease [I70.90 (ICD-10-CM)]  History:        Patient has prior history of Echocardiogram examinations, most recent 03/20/2020. CHF, CAD, Arrythmias:Atrial Fibrillation; Risk Factors:Hypertension.  Sonographer:    Margreta Journey RDCS Referring Phys: Rito Ehrlich [facility]   Sonographer Comments: Image acquisition challenging due to patient body habitus. IMPRESSIONS   1. TDS secondary to tachycardia. Left ventricular ejection fraction, by estimation, is 55 to 60%. The left ventricle has normal function. The left ventricle has no regional wall motion abnormalities. Left ventricular diastolic parameters are indeterminate. 2. Right ventricular systolic function is normal. The right ventricular size is normal. 3. The mitral valve is normal in structure. Trivial mitral valve regurgitation. No evidence of mitral stenosis. 4. The aortic valve is normal in structure. There is mild calcification of the aortic valve. There is mild thickening of the aortic valve. Aortic valve regurgitation is not visualized. Mild to moderate aortic valve stenosis. Aortic valve area, by VTI measures 1.10 cm. Aortic valve mean gradient measures 14.0 mmHg. 5. The inferior vena cava is normal in size with greater than  50% respiratory variability, suggesting right atrial pressure of 3 mmHg.  FINDINGS Left Ventricle: TDS secondary to tachycardia. Left ventricular ejection fraction, by estimation, is 55 to 60%. The left ventricle has normal function. The left ventricle has no regional wall motion abnormalities. The left ventricular internal cavity size was normal in size. There is no left ventricular hypertrophy. Left ventricular diastolic parameters are indeterminate.  Right Ventricle: The right ventricular size is normal. No increase in right ventricular wall thickness. Right ventricular systolic function is normal.  Left Atrium: Left atrial size was normal in size.  Right Atrium: Right atrial size was normal in size.  Pericardium: There is no evidence of pericardial effusion.  Mitral Valve: The mitral valve is normal in structure. Mild mitral annular calcification. Trivial mitral valve regurgitation. No evidence of mitral valve stenosis.  Tricuspid Valve:  The tricuspid valve is normal in structure. Tricuspid valve regurgitation is not demonstrated. No evidence of tricuspid stenosis.  Aortic Valve: The aortic valve is normal in structure. There is mild calcification of the aortic valve. There is mild thickening of the aortic valve. Aortic valve regurgitation is not visualized. Mild to moderate aortic stenosis is present. Aortic valve mean gradient measures 14.0 mmHg. Aortic valve peak gradient measures 19.3 mmHg. Aortic valve area, by VTI measures 1.10 cm.  Pulmonic Valve: The pulmonic valve was normal in structure. Pulmonic valve regurgitation is not visualized. No evidence of pulmonic stenosis.  Aorta: The aortic root is normal in size and structure.  Venous: The inferior vena cava is normal in size with greater than 50% respiratory variability, suggesting right atrial pressure of 3 mmHg.  IAS/Shunts: No atrial level shunt detected by color flow Doppler.   LEFT VENTRICLE PLAX 2D LVIDd:          4.00 cm LVIDs:         2.90 cm LV PW:         1.30 cm LV IVS:        1.30 cm LVOT diam:     1.90 cm LV SV:         42 LV SV Index:   22 LVOT Area:     2.84 cm   RIGHT VENTRICLE RV Basal diam:  2.60 cm RV S prime:     9.32 cm/s TAPSE (M-mode): 2.1 cm  LEFT ATRIUM             Index        RIGHT ATRIUM           Index LA diam:        4.60 cm 2.42 cm/m   RA Area:     15.80 cm LA Vol (A2C):   71.8 ml 37.79 ml/m  RA Volume:   31.50 ml  16.58 ml/m LA Vol (A4C):   52.7 ml 27.74 ml/m LA Biplane Vol: 62.4 ml 32.84 ml/m AORTIC VALVE AV Area (Vmax):    1.01 cm AV Area (Vmean):   0.86 cm AV Area (VTI):     1.10 cm AV Vmax:           219.60 cm/s AV Vmean:          176.800 cm/s AV VTI:            0.381 m AV Peak Grad:      19.3 mmHg AV Mean Grad:      14.0 mmHg LVOT Vmax:         78.34 cm/s LVOT Vmean:        53.380 cm/s LVOT VTI:          0.148 m LVOT/AV VTI ratio: 0.39  AORTA Ao Root diam: 3.10 cm Ao Asc diam:  3.70 cm  MITRAL VALVE MV Area (PHT): 3.48 cm     SHUNTS MV Decel Time: 218 msec     Systemic VTI:  0.15 m MV E velocity: 112.00 cm/s  Systemic Diam: 1.90 cm  Gypsy Balsam MD Electronically signed by Gypsy Balsam MD Signature Date/Time: 08/26/2021/2:09:01 PM    Final             Risk Assessment/Calculations:    CHA2DS2-VASc Score = 6   This indicates a 9.7% annual risk of stroke. The patient's score is based upon: CHF History: 1 HTN History: 1 Diabetes History: 1 Stroke History: 0 Vascular Disease History: 1 Age Score: 1 Gender  Score: 1            Physical Exam:   VS:  BP 124/84 (BP Location: Left Arm, Patient Position: Sitting, Cuff Size: Normal)   Pulse 64   Ht 5\' 1"  (1.549 m)   Wt 213 lb 12.8 oz (97 kg)   SpO2 92%   BMI 40.40 kg/m    Wt Readings from Last 3 Encounters:  11/20/22 213 lb 12.8 oz (97 kg)  09/29/22 216 lb 8 oz (98.2 kg)  07/22/22 210 lb 3.2 oz (95.3 kg)    GEN: Well nourished, well developed in no acute  distress NECK: No JVD; No carotid bruits; systolic murmur CARDIAC: RRR, 4/6 systolic murmur--no s2, rubs, gallops RESPIRATORY:  Trace rales LLL ABDOMEN: Soft, non-tender, non-distended EXTREMITIES:  +3 pitting edema to mid tibia; No deformity   ASSESSMENT AND PLAN: .   Aortic stenosis-was moderate per last echo in 2023, 4/6 systolic murmur no S2 auscultated.  Suspect that her aortic stenosis is now severe.  Will repeat echocardiogram.  DOE/pedal edema -shortness of breath and pedal edema have been going on for a few weeks, PCP changed her furosemide to torsemide.  Continue with current dose.  Will check proBNP, will repeat echocardiogram.  Will repeat BMET after change in diuresis therapy.  Coronary artery calcifications -incidental finding on CT imaging, Stable with no anginal symptoms. No indication for ischemic evaluation.  Currently on Xarelto 20 mg daily--creatinine clearance 92 no indication for dose reduction.  Continue Nexlizet, continue metoprolol 25 mg daily, continue nitroglycerin as needed--has not needed.  PAF/hypercoagulable state -s/p DCCV, maintaining sinus rhythm, CHA2DS2-VASc score of 6 continue Xarelto 20 mg daily but no indication for dose reduction with creatinine clearance of 92.  Continue metoprolol 25 mg daily.  Hypertension -blood pressure is well-controlled at 124/84, continue metoprolol 25 mg daily.        Dispo: Echocardiogram soon as possible, proBNP, BMET today.  Return in 2 weeks.  Signed, Flossie Dibble, NP

## 2022-11-20 NOTE — Patient Instructions (Signed)
Medication Instructions:  Your physician recommends that you continue on your current medications as directed. Please refer to the Current Medication list given to you today.  *If you need a refill on your cardiac medications before your next appointment, please call your pharmacy*   Lab Work: Your physician recommends that you return for lab work in: Today for a BMP and ProBNP  If you have labs (blood work) drawn today and your tests are completely normal, you will receive your results only by: MyChart Message (if you have MyChart) OR A paper copy in the mail If you have any lab test that is abnormal or we need to change your treatment, we will call you to review the results.   Testing/Procedures: Your physician has requested that you have an echocardiogram. Echocardiography is a painless test that uses sound waves to create images of your heart. It provides your doctor with information about the size and shape of your heart and how well your heart's chambers and valves are working. This procedure takes approximately one hour. There are no restrictions for this procedure. Please do NOT wear cologne, perfume, aftershave, or lotions (deodorant is allowed). Please arrive 15 minutes prior to your appointment time.    Follow-Up: At [facility], you and your health needs are our priority.  As part of our continuing mission to provide you with exceptional heart care, we have created designated Provider Care Teams.  These Care Teams include your primary Cardiologist (physician) and Advanced Practice Providers (APPs -  Physician Assistants and Nurse Practitioners) who all work together to provide you with the care you need, when you need it.  We recommend signing up for the patient portal called "MyChart".  Sign up information is provided on this After Visit Summary.  MyChart is used to connect with patients for Virtual Visits (Telemedicine).  Patients are able to view lab/test results,  encounter notes, upcoming appointments, etc.  Non-urgent messages can be sent to your provider as well.   To learn more about what you can do with MyChart, go to [email or web address].    Your next appointment:   2 week(s)  Provider:   Belva Crome, MD    Other Instructions

## 2022-11-21 LAB — BASIC METABOLIC PANEL WITH GFR
BUN/Creatinine Ratio: 13 (ref 12–28)
BUN: 14 mg/dL (ref 8–27)
CO2: 24 mmol/L (ref 20–29)
Calcium: 9 mg/dL (ref 8.7–10.3)
Chloride: 99 mmol/L (ref 96–106)
Creatinine, Ser: 1.08 mg/dL — ABNORMAL HIGH (ref 0.57–1.00)
Glucose: 127 mg/dL — ABNORMAL HIGH (ref 70–99)
Potassium: 4.1 mmol/L (ref 3.5–5.2)
Sodium: 140 mmol/L (ref 134–144)
eGFR: 56 mL/min/1.73 — ABNORMAL LOW

## 2022-11-21 LAB — PRO B NATRIURETIC PEPTIDE: NT-Pro BNP: 280 pg/mL (ref 0–301)

## 2022-11-24 ENCOUNTER — Telehealth: Payer: Self-pay | Admitting: *Deleted

## 2022-11-24 DIAGNOSIS — I10 Essential (primary) hypertension: Secondary | ICD-10-CM | POA: Diagnosis not present

## 2022-11-24 DIAGNOSIS — H25811 Combined forms of age-related cataract, right eye: Secondary | ICD-10-CM | POA: Diagnosis not present

## 2022-11-24 DIAGNOSIS — H18523 Epithelial (juvenile) corneal dystrophy, bilateral: Secondary | ICD-10-CM | POA: Diagnosis not present

## 2022-11-24 DIAGNOSIS — E119 Type 2 diabetes mellitus without complications: Secondary | ICD-10-CM | POA: Diagnosis not present

## 2022-11-24 DIAGNOSIS — I480 Paroxysmal atrial fibrillation: Secondary | ICD-10-CM | POA: Diagnosis not present

## 2022-11-24 DIAGNOSIS — H259 Unspecified age-related cataract: Secondary | ICD-10-CM | POA: Diagnosis not present

## 2022-11-24 DIAGNOSIS — Z7984 Long term (current) use of oral hypoglycemic drugs: Secondary | ICD-10-CM | POA: Diagnosis not present

## 2022-11-24 DIAGNOSIS — Z7901 Long term (current) use of anticoagulants: Secondary | ICD-10-CM | POA: Diagnosis not present

## 2022-11-24 NOTE — Telephone Encounter (Signed)
Left message voicemail for pt to call us back.

## 2022-11-24 NOTE — Telephone Encounter (Signed)
-----   Message from Flossie Dibble, NP sent at 11/22/2022  5:29 PM EDT ----- Ms. Czerniak, Lab work showed stable kidney function, normal electrolytes.  You are not holding onto too much fluid as a result of your heart, which is good, but you are likely feeling short of breath and have leg swelling due to your aortic stenosis. We need to continue with same meds for now and get the echocardiogram as soon as possible.  Best, Victorino Dike

## 2022-11-25 NOTE — Telephone Encounter (Signed)
Patient is returning call. Please advise? 

## 2022-11-25 NOTE — Telephone Encounter (Signed)
Spoke with pt's daughter, Shanda Bumps ([place]), gave lab results and daughter stated pt has appt for echo at [facility] scheduled. We will call with results as soon as we have them.

## 2022-11-26 DIAGNOSIS — R0609 Other forms of dyspnea: Secondary | ICD-10-CM | POA: Diagnosis not present

## 2022-11-26 DIAGNOSIS — I350 Nonrheumatic aortic (valve) stenosis: Secondary | ICD-10-CM | POA: Diagnosis not present

## 2022-12-02 ENCOUNTER — Ambulatory Visit: Payer: PPO | Attending: Cardiology | Admitting: Cardiology

## 2022-12-02 ENCOUNTER — Encounter: Payer: Self-pay | Admitting: Cardiology

## 2022-12-02 VITALS — BP 120/60 | HR 65 | Ht 61.0 in | Wt 210.0 lb

## 2022-12-02 DIAGNOSIS — I480 Paroxysmal atrial fibrillation: Secondary | ICD-10-CM | POA: Diagnosis not present

## 2022-12-02 DIAGNOSIS — R0609 Other forms of dyspnea: Secondary | ICD-10-CM

## 2022-12-02 DIAGNOSIS — I350 Nonrheumatic aortic (valve) stenosis: Secondary | ICD-10-CM

## 2022-12-02 DIAGNOSIS — E785 Hyperlipidemia, unspecified: Secondary | ICD-10-CM | POA: Diagnosis not present

## 2022-12-02 DIAGNOSIS — I10 Essential (primary) hypertension: Secondary | ICD-10-CM | POA: Diagnosis not present

## 2022-12-02 HISTORY — DX: Nonrheumatic aortic (valve) stenosis: I35.0

## 2022-12-02 NOTE — Progress Notes (Signed)
Cardiology Office Note:    Date:  12/02/2022   ID:  Crystal Ramos, DOB 23-May-1955, MRN [id number]  PCP:  Paulina Fusi, MD  Cardiologist:  Garwin Brothers, MD   Referring MD: Paulina Fusi, MD    ASSESSMENT:    1. Paroxysmal atrial fibrillation (HCC)   2. Essential hypertension   3. Dyslipidemia   4. Dyspnea on exertion   5. Morbid obesity (HCC)   6. Severe aortic stenosis    PLAN:    In order of problems listed above:  Dyspnea on exertion and congestive heart failure: Severe aortic stenosis: I discussed my findings with the patient at length and I discussed the following.I discussed coronary angiography and left heart catheterization with the patient at extensive length. Procedure, benefits and potential risks were explained. Patient had multiple questions which were answered to the patient's satisfaction. Patient agreed and consented for the procedure. Further recommendations will be made based on the findings of the coronary angiography. In the interim. The patient has any significant symptoms he knows to go to the nearest emergency room. Essential hypertension: Blood pressure stable and diet was emphasized Mixed dyslipidemia: Diet was emphasized.  Labs were reviewed Diabetes mellitus and morbid obesity: Weight reduction was stressed and she promises to do better.  Risks of obesity explained. Further recommendations will be made based on the findings of the aforementioned test.   Medication Adjustments/Labs and Tests Ordered: Current medicines are reviewed at length with the patient today.  Concerns regarding medicines are outlined above.  Orders Placed This Encounter  Procedures   EKG 12-Lead   No orders of the defined types were placed in this encounter.    No chief complaint on file.    History of Present Illness:    Crystal Ramos is a 67 y.o. female.  She has past medical history of essential hypertension, paroxysmal fibrillation,  essential hypertension, dyslipidemia.  She saw our nurse practitioner recently with congestive heart failure and pedal edema and was treated.  Follow-up echocardiogram has revealed severe aortic stenosis.  Patient denies any syncope or chest pain.  However she has shortness of breath on exertion.  Her pedal edema is some better and echo has revealed severe aortic stenosis.  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   Dyslipidemia 02/25/2017   Dyspnea on exertion 12/18/2015   Epistaxis 06/26/2021   Essential hypertension    Hematuria 11/29/2017   Morbid obesity (HCC) 02/25/2017   Osteopenia after menopause 12/01/2019   Paroxysmal atrial fibrillation (HCC) 02/25/2017   Psoriasis 07/31/2020   Type 2 diabetes mellitus without complication (HCC) 12/18/2015    Past Surgical History:  Procedure Laterality Date   CHOLECYSTECTOMY     LEFT HEART CATH AND CORONARY ANGIOGRAPHY N/A 04/03/2019   Procedure: LEFT HEART CATH AND CORONARY ANGIOGRAPHY;  Surgeon: Kathleene Hazel, MD;  Location: [facility] INVASIVE CV LAB;  Service: Cardiovascular;  Laterality: N/A;  MEDIAL PARTIAL KNEE REPLACEMENT      Current Medications: Current Meds  Medication Sig   ALPRAZolam (XANAX) 0.25 MG tablet Take 0.25 mg by mouth at bedtime.    amiodarone (PACERONE) 200 MG tablet Take 0.5 tablets (100 mg total) by mouth 2 (two) times daily.    aspirin EC 81 MG tablet Take 81 mg by mouth daily.   Bempedoic Acid-Ezetimibe (NEXLIZET) 180-10 MG TABS Take 1 tablet by mouth daily.   canagliflozin (INVOKANA) 300 MG TABS tablet Take 300 mg by mouth daily before breakfast.   ezetimibe (ZETIA) 10 MG tablet Take 10 mg by mouth daily.   Ferrous Sulfate (IRON) 325 (65 Fe) MG TABS Take 1 tablet by mouth daily.   JARDIANCE 25 MG TABS tablet Take 25 mg by mouth daily.   levothyroxine (SYNTHROID) 100 MCG tablet Take 100 mcg by mouth daily.   meclizine (ANTIVERT) 25 MG tablet Take 25 mg by mouth 3 (three) times daily.    metFORMIN (GLUCOPHAGE) 850 MG tablet Take 850 mg by mouth 3 (three) times daily.   metoprolol succinate (TOPROL-XL) 25 MG 24 hr tablet Take 1 tablet (25 mg total) by mouth 2 (two) times daily.   nitroGLYCERIN (NITROSTAT) 0.4 MG SL tablet Place 1 tablet (0.4 mg total) under the tongue every 5 (five) minutes as needed for chest pain.   raloxifene (EVISTA) 60 MG tablet Take 1 tablet by mouth once daily   rivaroxaban (XARELTO) 20 MG TABS tablet Take 20 mg by mouth daily with supper.   tiZANidine (ZANAFLEX) 4 MG tablet Take 4 mg by mouth 3 (three) times daily as needed for muscle spasms.   torsemide (DEMADEX) 20 MG tablet Take 20 mg by mouth 2 (two) times daily.   traMADol (ULTRAM) 50 MG tablet Take 50 mg by mouth 4 (four) times daily as needed for pain.     Allergies:   Patient has no known allergies.   Social History   Socioeconomic History   Marital status: Married    Spouse name: Not on file   Number of children: Not on file   Years of education: Not on file   Highest education level: Not on file  Occupational History   Not on file  Tobacco Use   Smoking status: Never    Passive exposure: Yes   Smokeless tobacco: Never   Tobacco comments:    "whole family" smoked in the house  Vaping Use   Vaping status: Never Used  Substance and Sexual Activity   Alcohol use: No   Drug use: No   Sexual activity: Not on file  Other  Topics Concern   Not on file  Social History Narrative   Not on file   Social Determinants of Health   Financial Resource Strain: Not on file  Food Insecurity: Not on file  Transportation Needs: Not on file  Physical Activity: Not on file  Stress: Not on file  Social Connections: Not on file     Family History: The patient's family history includes Breast cancer in her paternal aunt; Diabetes in her mother; Stroke in her father and paternal aunt.  ROS:   Please see the history of present illness.    All other systems reviewed and are negative.  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   Recent Labs: 05/19/2022: Hemoglobin 11.4; Platelets 158 07/22/2022: ALT 17 11/20/2022: BUN 14; Creatinine, Ser 1.08; NT-Pro BNP 280;  Potassium 4.1; Sodium 140  Recent Lipid Panel    Component Value Date/Time   CHOL 164 01/12/2022 0904   TRIG 69 01/12/2022 0904   HDL 56 01/12/2022 0904   CHOLHDL 2.9 01/12/2022 0904   LDLCALC 95 01/12/2022 0904    Physical Exam:    VS:  BP 120/60   Pulse 65   Ht 5\' 1"  (1.549 m)   Wt 210 lb (95.3 kg)   SpO2 95%   BMI 39.68 kg/m     Wt Readings from Last 3 Encounters:  12/02/22 210 lb (95.3 kg)  11/20/22 213 lb 12.8 oz (97 kg)  09/29/22 216 lb 8 oz (98.2 kg)     GEN: Patient is in no acute distress HEENT: Normal NECK: No JVD; No carotid bruits LYMPHATICS: No lymphadenopathy CARDIAC: Hear sounds irregular, 2/6 systolic murmur at the apex and at the aortic area. RESPIRATORY:  Clear to auscultation without rales, wheezing or rhonchi  ABDOMEN: Soft, non-tender, non-distended MUSCULOSKELETAL: Bilateral 2+ edema; No deformity  SKIN: Warm and dry NEUROLOGIC:  Alert and oriented x 3 PSYCHIATRIC:  Normal affect   Signed, Garwin Brothers, MD  12/02/2022 3:19 PM    [facility]

## 2022-12-02 NOTE — H&P (View-Only) (Signed)
Cardiology Office Note:    Date:  12/02/2022   ID:  Crystal Ramos, DOB 23-May-1955, MRN [id number]  PCP:  Paulina Fusi, MD  Cardiologist:  Garwin Brothers, MD   Referring MD: Paulina Fusi, MD    ASSESSMENT:    1. Paroxysmal atrial fibrillation (HCC)   2. Essential hypertension   3. Dyslipidemia   4. Dyspnea on exertion   5. Morbid obesity (HCC)   6. Severe aortic stenosis    PLAN:    In order of problems listed above:  Dyspnea on exertion and congestive heart failure: Severe aortic stenosis: I discussed my findings with the patient at length and I discussed the following.I discussed coronary angiography and left heart catheterization with the patient at extensive length. Procedure, benefits and potential risks were explained. Patient had multiple questions which were answered to the patient's satisfaction. Patient agreed and consented for the procedure. Further recommendations will be made based on the findings of the coronary angiography. In the interim. The patient has any significant symptoms he knows to go to the nearest emergency room. Essential hypertension: Blood pressure stable and diet was emphasized Mixed dyslipidemia: Diet was emphasized.  Labs were reviewed Diabetes mellitus and morbid obesity: Weight reduction was stressed and she promises to do better.  Risks of obesity explained. Further recommendations will be made based on the findings of the aforementioned test.   Medication Adjustments/Labs and Tests Ordered: Current medicines are reviewed at length with the patient today.  Concerns regarding medicines are outlined above.  Orders Placed This Encounter  Procedures   EKG 12-Lead   No orders of the defined types were placed in this encounter.    No chief complaint on file.    History of Present Illness:    Crystal Ramos is a 67 y.o. female.  She has past medical history of essential hypertension, paroxysmal fibrillation,  essential hypertension, dyslipidemia.  She saw our nurse practitioner recently with congestive heart failure and pedal edema and was treated.  Follow-up echocardiogram has revealed severe aortic stenosis.  Patient denies any syncope or chest pain.  However she has shortness of breath on exertion.  Her pedal edema is some better and echo has revealed severe aortic stenosis.  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   Dyslipidemia 02/25/2017   Dyspnea on exertion 12/18/2015   Epistaxis 06/26/2021   Essential hypertension    Hematuria 11/29/2017   Morbid obesity (HCC) 02/25/2017   Osteopenia after menopause 12/01/2019   Paroxysmal atrial fibrillation (HCC) 02/25/2017   Psoriasis 07/31/2020   Type 2 diabetes mellitus without complication (HCC) 12/18/2015    Past Surgical History:  Procedure Laterality Date   CHOLECYSTECTOMY     LEFT HEART CATH AND CORONARY ANGIOGRAPHY N/A 04/03/2019   Procedure: LEFT HEART CATH AND CORONARY ANGIOGRAPHY;  Surgeon: Kathleene Hazel, MD;  Location: [facility];  Service: Cardiovascular;  Laterality: N/A;  MEDIAL PARTIAL KNEE REPLACEMENT      Current Medications: Current Meds  Medication Sig   ALPRAZolam (XANAX) 0.25 MG tablet Take 0.25 mg by mouth at bedtime.    amiodarone (PACERONE) 200 MG tablet Take 0.5 tablets (100 mg total) by mouth 2 (two) times daily.    aspirin EC 81 MG tablet Take 81 mg by mouth daily.   Bempedoic Acid-Ezetimibe (NEXLIZET) 180-10 MG TABS Take 1 tablet by mouth daily.   canagliflozin (INVOKANA) 300 MG TABS tablet Take 300 mg by mouth daily before breakfast.   ezetimibe (ZETIA) 10 MG tablet Take 10 mg by mouth daily.   Ferrous Sulfate (IRON) 325 (65 Fe) MG TABS Take 1 tablet by mouth daily.   JARDIANCE 25 MG TABS tablet Take 25 mg by mouth daily.   levothyroxine (SYNTHROID) 100 MCG tablet Take 100 mcg by mouth daily.   meclizine (ANTIVERT) 25 MG tablet Take 25 mg by mouth 3 (three) times daily.    metFORMIN (GLUCOPHAGE) 850 MG tablet Take 850 mg by mouth 3 (three) times daily.   metoprolol succinate (TOPROL-XL) 25 MG 24 hr tablet Take 1 tablet (25 mg total) by mouth 2 (two) times daily.   nitroGLYCERIN (NITROSTAT) 0.4 MG SL tablet Place 1 tablet (0.4 mg total) under the tongue every 5 (five) minutes as needed for chest pain.   raloxifene (EVISTA) 60 MG tablet Take 1 tablet by mouth once daily   rivaroxaban (XARELTO) 20 MG TABS tablet Take 20 mg by mouth daily with supper.   tiZANidine (ZANAFLEX) 4 MG tablet Take 4 mg by mouth 3 (three) times daily as needed for muscle spasms.   torsemide (DEMADEX) 20 MG tablet Take 20 mg by mouth 2 (two) times daily.   traMADol (ULTRAM) 50 MG tablet Take 50 mg by mouth 4 (four) times daily as needed for pain.     Allergies:   Patient has no known allergies.   Social History   Socioeconomic History   Marital status: Married    Spouse name: Not on file   Number of children: Not on file   Years of education: Not on file   Highest education level: Not on file  Occupational History   Not on file  Tobacco Use   Smoking status: Never    Passive exposure: Yes   Smokeless tobacco: Never   Tobacco comments:    "whole family" smoked in the house  Vaping Use   Vaping status: Never Used  Substance and Sexual Activity   Alcohol use: No   Drug use: No   Sexual activity: Not on file  Other  Topics Concern   Not on file  Social History Narrative   Not on file   Social Determinants of Health   Financial Resource Strain: Not on file  Food Insecurity: Not on file  Transportation Needs: Not on file  Physical Activity: Not on file  Stress: Not on file  Social Connections: Not on file     Family History: The patient's family history includes Breast cancer in her paternal aunt; Diabetes in her mother; Stroke in her father and paternal aunt.  ROS:   Please see the history of present illness.    All other systems reviewed and are negative.  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   Recent Labs: 05/19/2022: Hemoglobin 11.4; Platelets 158 07/22/2022: ALT 17 11/20/2022: BUN 14; Creatinine, Ser 1.08; NT-Pro BNP 280;  Potassium 4.1; Sodium 140  Recent Lipid Panel    Component Value Date/Time   CHOL 164 01/12/2022 0904   TRIG 69 01/12/2022 0904   HDL 56 01/12/2022 0904   CHOLHDL 2.9 01/12/2022 0904   LDLCALC 95 01/12/2022 0904    Physical Exam:    VS:  BP 120/60   Pulse 65   Ht 5\' 1"  (1.549 m)   Wt 210 lb (95.3 kg)   SpO2 95%   BMI 39.68 kg/m     Wt Readings from Last 3 Encounters:  12/02/22 210 lb (95.3 kg)  11/20/22 213 lb 12.8 oz (97 kg)  09/29/22 216 lb 8 oz (98.2 kg)     GEN: Patient is in no acute distress HEENT: Normal NECK: No JVD; No carotid bruits LYMPHATICS: No lymphadenopathy CARDIAC: Hear sounds irregular, 2/6 systolic murmur at the apex and at the aortic area. RESPIRATORY:  Clear to auscultation without rales, wheezing or rhonchi  ABDOMEN: Soft, non-tender, non-distended MUSCULOSKELETAL: Bilateral 2+ edema; No deformity  SKIN: Warm and dry NEUROLOGIC:  Alert and oriented x 3 PSYCHIATRIC:  Normal affect   Signed, Garwin Brothers, MD  12/02/2022 3:19 PM    [facility]

## 2022-12-02 NOTE — Patient Instructions (Addendum)
Medication Instructions:  Your physician recommends that you continue on your current medications as directed. Please refer to the Current Medication list given to you today.  *If you need a refill on your cardiac medications before your next appointment, please call your pharmacy*   Lab Work: Your physician recommends that you have a BMET and CBC today in the office for your upcoming procedure.  If you have labs (blood work) drawn today and your tests are completely normal, you will receive your results only by: MyChart Message (if you have MyChart) OR A paper copy in the mail If you have any lab test that is abnormal or we need to change your treatment, we will call you to review the results.   Testing/Procedures:  [place] [facility] DEPT OF [facility] AT [place] [address] [place] [address] Dept: [phone] Loc: [phone]  Jolynda Townley  12/02/2022  You are scheduled for a Cardiac Catheterization on Monday, July 22 with Dr. Cristal De[place] End.  1. Please arrive at the [facility] (Main Entrance A) at [facility]: [address] [place], [place] [address] at 5:30 AM (This time is 2 hour(s) before your procedure to ensure your preparation). Free valet parking service is available. You will check in at ADMITTING. The support person will be asked to wait in the waiting room.  It is OK to have someone drop you off and come back when you are ready to be discharged.    Special note: Every effort is made to have your procedure done on time. Please understand that emergencies sometimes delay scheduled procedures.  2. Diet: Do not eat solid foods after midnight.  The patient may have clear liquids until 5am upon the day of the procedure.  3. Labs: You had your labs done today in the office  4. Medication instructions in preparation for your procedure:   Contrast Allergy: No  Stop taking Xarelto (Rivaroxaban)  on Saturday, July 20.  Stop taking, Torsemide (Demadex) Monday, July 22,  Stop taking Invokana (Canaflifozin), Jardiance (Empagliflozin) Friday, July 19  Do not take Diabetes Med Glucophage (Metformin) on the day of the procedure and HOLD 48 HOURS AFTER THE PROCEDURE.  On the morning of your procedure, take your Aspirin 81 mg and any morning medicines NOT listed above.  You may use sips of water.  5. Plan to go home the same day, you will only stay overnight if medically necessary. 6. Bring a current list of your medications and current insurance cards. 7. You MUST have a responsible person to drive you home. 8. Someone MUST be with you the first 24 hours after you arrive home or your discharge will be delayed. 9. Please wear clothes that are easy to get on and off and wear slip-on shoes.  Thank you for allowing [place] to care for you!   -- [facility]    Follow-Up: At [facility], you and your health needs are our priority.  As part of our continuing mission to provide you with exceptional heart care, we have created designated Provider Care Teams.  These Care Teams include your primary Cardiologist (physician) and Advanced Practice Providers (APPs -  Physician Assistants and Nurse Practitioners) who all work together to provide you with the care you need, when you need it.  We recommend signing up for the patient portal called "MyChart".  Sign up information is provided on this After Visit Summary.  MyChart is used to connect with patients for Virtual Visits (Telemedicine).  Patients are able to view lab/test results, encounter notes, upcoming appointments, etc.  Non-urgent messages can be sent to your provider as well.   To learn more about what you can do with MyChart, go to [email or web address].    Your next appointment:   9 month(s)  The format for your next appointment:   In Person  Provider:   Belva Crome, MD   Other  Instructions  Coronary Angiogram With Stent Coronary angiogram with stent placement is a procedure to widen or open a narrow blood vessel of the heart (coronary artery). Arteries may become blocked by cholesterol buildup (plaques) in the lining of the artery wall. When a coronary artery becomes partially blocked, blood flow to that area decreases. This may lead to chest pain or a heart attack (myocardial infarction). A stent is a small piece of metal that looks like mesh or spring. Stent placement may be done as treatment after a heart attack, or to prevent a heart attack if a blocked artery is found by a coronary angiogram. Let your health care provider know about: Any allergies you have, including allergies to medicines or contrast dye. All medicines you are taking, including vitamins, herbs, eye drops, creams, and over-the-counter medicines. Any problems you or family members have had with anesthetic medicines. Any blood disorders you have. Any surgeries you have had. Any medical conditions you have, including kidney problems or kidney failure. Whether you are pregnant or may be pregnant. Whether you are breastfeeding. What are the risks? Generally, this is a safe procedure. However, serious problems may occur, including: Damage to nearby structures or organs, such as the heart, blood vessels, or kidneys. A return of blockage. Bleeding, infection, or bruising at the insertion site. A collection of blood under the skin (hematoma) at the insertion site. A blood clot in another part of the body. Allergic reaction to medicines or dyes. Bleeding into the abdomen (retroperitoneal bleeding). Stroke (rare). Heart attack (rare). What happens before the procedure? Staying hydrated Follow instructions from your health care provider about hydration, which may include: Up to 2 hours before the procedure - you may continue to drink clear liquids, such as water, clear fruit juice, black coffee, and  plain tea.    Eating and drinking restrictions Follow instructions from your health care provider about eating and drinking, which may include: 8 hours before the procedure - stop eating heavy meals or foods, such as meat, fried foods, or fatty foods. 6 hours before the procedure - stop eating light meals or foods, such as toast or cereal. 2 hours before the procedure - stop drinking clear liquids. Medicines Ask your health care provider about: Changing or stopping your regular medicines. This is especially important if you are taking diabetes medicines or blood thinners. Taking medicines such as aspirin and ibuprofen. These medicines can thin your blood. Do not take these medicines unless your health care provider tells you to take them. Generally, aspirin is recommended before a thin tube, called a catheter, is passed through a blood vessel and inserted into the heart (cardiac catheterization). Taking over-the-counter medicines, vitamins, herbs, and supplements. General instructions Do not use any products that contain nicotine or tobacco for at least 4 weeks before the procedure. These products include cigarettes, e-cigarettes, and chewing tobacco. If you need help quitting, ask your health care provider. Plan to have someone take you home from the hospital or clinic. If you will be going  home right after the procedure, plan to have someone with you for 24 hours. You may have tests and imaging procedures. Ask your health care provider: How your insertion site will be marked. Ask which artery will be used for the procedure. What steps will be taken to help prevent infection. These may include: Removing hair at the insertion site. Washing skin with a germ-killing soap. Taking antibiotic medicine. What happens during the procedure? An IV will be inserted into one of your veins. Electrodes may be placed on your chest to monitor your heart rate during the procedure. You will be given one or  more of the following: A medicine to help you relax (sedative). A medicine to numb the area (local anesthetic) for catheter insertion. A small incision will be made for catheter insertion. The catheter will be inserted into an artery using a guide wire. The location may be in your groin, your wrist, or the fold of your arm (near your elbow). An X-ray procedure (fluoroscopy) will be used to help guide the catheter to the opening of the heart arteries. A dye will be injected into the catheter. X-rays will be taken. The dye helps to show where any narrowing or blockages are located in the arteries. Tell your health care provider if you have chest pain or trouble breathing. A tiny wire will be guided to the blocked spot, and a balloon will be inflated to make the artery wider. The stent will be expanded to crush the plaques into the wall of the vessel. The stent will hold the area open and improve the blood flow. Most stents have a drug coating to reduce the risk of the stent narrowing over time. The artery may be made wider using a drill, laser, or other tools that remove plaques. The catheter will be removed when the blood flow improves. The stent will stay where it was placed, and the lining of the artery will grow over it. A bandage (dressing) will be placed on the insertion site. Pressure will be applied to stop bleeding. The IV will be removed. This procedure may vary among health care providers and hospitals.    What happens after the procedure? Your blood pressure, heart rate, breathing rate, and blood oxygen level will be monitored until you leave the hospital or clinic. If the procedure is done through the leg, you will lie flat in bed for a few hours or for as long as told by your health care provider. You will be instructed not to bend or cross your legs. The insertion site and the pulse in your foot or wrist will be checked often. You may have more blood tests, X-rays, and a test that  records the electrical activity of your heart (electrocardiogram, or ECG). Do not drive for 24 hours if you were given a sedative during your procedure. Summary Coronary angiogram with stent placement is a procedure to widen or open a narrowed coronary artery. This is done to treat heart problems. Before the procedure, let your health care provider know about all the medical conditions and surgeries you have or have had. This is a safe procedure. However, some problems may occur, including damage to nearby structures or organs, bleeding, blood clots, or allergies. Follow your health care provider's instructions about eating, drinking, medicines, and other lifestyle changes, such as quitting tobacco use before the procedure. This information is not intended to replace advice given to you by your health care provider. Make sure you discuss any questions you have  with your health care provider. Document Revised: 11/23/2018 Document Reviewed: 11/23/2018 Elsevier Patient Education  2021 Elsevier Inc.  Aspirin and Your Heart Aspirin is a medicine that prevents the platelets in your blood from sticking together. Platelets are the cells that your blood uses for clotting. Aspirin can be used to help reduce the risk of blood clots, heart attacks, and other heart-related problems. What are the risks? Daily use of aspirin can cause side effects. Some of these include: Bleeding. Bleeding can be minor or serious. An example of minor bleeding is bleeding from a cut, and the bleeding does not stop. An example of more serious bleeding is stomach bleeding or, rarely, bleeding into the brain. Your risk of bleeding increases if you are also taking NSAIDs, such as ibuprofen. Increased bruising. Upset stomach. An allergic reaction. People who have growths inside the nose (nasal polyps) have an increased risk of developing an aspirin allergy. How to use aspirin to care for your heart Take aspirin only as told by your  health care provider. Make sure that you understand how much to take and what form to take. The two forms of aspirin are: Non-enteric-coated.This type of aspirin does not have a coating and is absorbed quickly. This type of aspirin also comes in a chewable form. Enteric-coated. This type of aspirin has a coating that releases the medicine very slowly. Enteric-coated aspirin might cause less stomach upset than non-enteric-coated aspirin. This type of aspirin should not be chewed or crushed. Work with your health care provider to find out whether it is safe and beneficial for you to take aspirin daily. Taking aspirin daily may be helpful if: You have had a heart attack or chest pain, or you are at risk for a heart attack. You have a condition in which certain heart vessels are blocked (coronary artery disease), and you have had a procedure to treat it. Examples are: Open-heart surgery, such as coronary artery bypass surgery (CABG). Coronary angioplasty,which is done to widen a blood vessel of your heart. Having a small mesh tube, or stent, placed in your coronary artery. You have had certain types of stroke or a mini-stroke known as a transient ischemic attack (TIA). You have a narrowing of the arteries that supply the limbs (peripheral artery disease, or PAD). You have long-term (chronic) heart rhythm problems, such as atrial fibrillation, and your health care provider thinks aspirin may help. You have valve disease or have had surgery on a valve. You are considered at increased risk of developing coronary artery disease or PAD.    Follow these instructions at home Medicines Take over-the-counter and prescription medicines only as told by your health care provider. If you are taking blood thinners: Talk with your health care provider before you take any medicines that contain aspirin or NSAIDs, such as ibuprofen. These medicines increase your risk for dangerous bleeding. Take your medicine exactly  as told, at the same time every day. Avoid activities that could cause injury or bruising, and follow instructions about how to prevent falls. Wear a medical alert bracelet or carry a card that lists what medicines you take. General instructions Do not drink alcohol if: Your health care provider tells you not to drink. You are pregnant, may be pregnant, or are planning to become pregnant. If you drink alcohol: Limit how much you use to: 0-1 drink a day for women. 0-2 drinks a day for men. Be aware of how much alcohol is in your drink. In the [place] one drink  equals one 12 oz bottle of beer (355 mL), one 5 oz glass of wine (148 mL), or one 1 oz glass of hard liquor (44 mL). Keep all follow-up visits as told by your health care provider. This is important. Where to find more information The American Heart Association: www.heart.org Contact a health care provider if you have: Unusual bleeding or bruising. Stomach pain or nausea. Ringing in your ears. An allergic reaction that causes hives, itchy skin, or swelling of the lips, tongue, or face. Get help right away if: You notice that your bowel movements are bloody, or dark red or black in color. You vomit or cough up blood. You have blood in your urine. You cough, breathe loudly (wheeze), or feel short of breath. You have chest pain, especially if the pain spreads to your arms, back, neck, or jaw. You have a headache with confusion. You have any symptoms of a stroke. "BE FAST" is an easy way to remember the main warning signs of a stroke: B - Balance. Signs are dizziness, sudden trouble walking, or loss of balance. E - Eyes. Signs are trouble seeing or a sudden change in vision. F - Face. Signs are sudden weakness or numbness of the face, or the face or eyelid drooping on one side. A - Arms. Signs are weakness or numbness in an arm. This happens suddenly and usually on one side of the body. S - Speech. Signs are sudden trouble speaking,  slurred speech, or trouble understanding what people say. T - Time. Time to call emergency services. Write down what time symptoms started. You have other signs of a stroke, such as: A sudden, severe headache with no known cause. Nausea or vomiting. Seizure. These symptoms may represent a serious problem that is an emergency. Do not wait to see if the symptoms will go away. Get medical help right away. Call your local emergency services (911 in the [place] Do not drive yourself to the hospital. Summary Aspirin use can help reduce the risk of blood clots, heart attacks, and other heart-related problems. Daily use of aspirin can cause side effects. Take aspirin only as told by your health care provider. Make sure that you understand how much to take and what form to take. Your health care provider will help you determine whether it is safe and beneficial for you to take aspirin daily. This information is not intended to replace advice given to you by your health care provider. Make sure you discuss any questions you have with your health care provider. Document Revised: 02/06/2019 Document Reviewed: 02/06/2019 Elsevier Patient Education  2021 Elsevier Inc. Nitroglycerin sublingual tablets What is this medicine? NITROGLYCERIN (nye troe GLI ser in) is a type of vasodilator. It relaxes blood vessels, increasing the blood and oxygen supply to your heart. This medicine is used to relieve chest pain caused by angina. It is also used to prevent chest pain before activities like climbing stairs, going outdoors in cold weather, or sexual activity. This medicine may be used for other purposes; ask your health care provider or pharmacist if you have questions. COMMON BRAND NAME(S): Nitroquick, Nitrostat, Nitrotab What should I tell my health care provider before I take this medicine? They need to know if you have any of these conditions: anemia head injury, recent stroke, or bleeding in the brain liver  disease previous heart attack an unusual or allergic reaction to nitroglycerin, other medicines, foods, dyes, or preservatives pregnant or trying to get pregnant breast-feeding How should I use  this medicine? Take this medicine by mouth as needed. Use at the first sign of an angina attack (chest pain or tightness). You can also take this medicine 5 to 10 minutes before an event likely to produce chest pain. Follow the directions exactly as written on the prescription label. Place one tablet under your tongue and let it dissolve. Do not swallow whole. Replace the dose if you accidentally swallow it. It will help if your mouth is not dry. Saliva around the tablet will help it to dissolve more quickly. Do not eat or drink, smoke or chew tobacco while a tablet is dissolving. Sit down when taking this medicine. In an angina attack, you should feel better within 5 minutes after your first dose. You can take a dose every 5 minutes up to a total of 3 doses. If you do not feel better or feel worse after 1 dose, call 9-1-1 at once. Do not take more than 3 doses in 15 minutes. Your health care provider might give you other directions. Follow those directions if he or she does. Do not take your medicine more often than directed. Talk to your health care provider about the use of this medicine in children. Special care may be needed. Overdosage: If you think you have taken too much of this medicine contact a poison control center or emergency room at once. NOTE: This medicine is only for you. Do not share this medicine with others. What if I miss a dose? This does not apply. This medicine is only used as needed. What may interact with this medicine? Do not take this medicine with any of the following medications: certain migraine medicines like ergotamine and dihydroergotamine (DHE) medicines used to treat erectile dysfunction like sildenafil, tadalafil, and vardenafil riociguat This medicine may also interact  with the following medications: alteplase aspirin heparin medicines for high blood pressure medicines for mental depression other medicines used to treat angina phenothiazines like chlorpromazine, mesoridazine, prochlorperazine, thioridazine This list may not describe all possible interactions. Give your health care provider a list of all the medicines, herbs, non-prescription drugs, or dietary supplements you use. Also tell them if you smoke, drink alcohol, or use illegal drugs. Some items may interact with your medicine. What should I watch for while using this medicine? Tell your doctor or health care professional if you feel your medicine is no longer working. Keep this medicine with you at all times. Sit or lie down when you take your medicine to prevent falling if you feel dizzy or faint after using it. Try to remain calm. This will help you to feel better faster. If you feel dizzy, take several deep breaths and lie down with your feet propped up, or bend forward with your head resting between your knees. You may get drowsy or dizzy. Do not drive, use machinery, or do anything that needs mental alertness until you know how this drug affects you. Do not stand or sit up quickly, especially if you are an older patient. This reduces the risk of dizzy or fainting spells. Alcohol can make you more drowsy and dizzy. Avoid alcoholic drinks. Do not treat yourself for coughs, colds, or pain while you are taking this medicine without asking your doctor or health care professional for advice. Some ingredients may increase your blood pressure. What side effects may I notice from receiving this medicine? Side effects that you should report to your doctor or health care professional as soon as possible: allergic reactions (skin rash, itching or  hives; swelling of the face, lips, or tongue) low blood pressure (dizziness; feeling faint or lightheaded, falls; unusually weak or tired) low red blood cell counts  (trouble breathing; feeling faint; lightheaded, falls; unusually weak or tired) Side effects that usually do not require medical attention (report to your doctor or health care professional if they continue or are bothersome): facial flushing (redness) headache nausea, vomiting This list may not describe all possible side effects. Call your doctor for medical advice about side effects. You may report side effects to FDA at 1-800-FDA-1088. Where should I keep my medicine? Keep out of the reach of children. Store at room temperature between 20 and 25 degrees C (68 and 77 degrees F). Store in Retail buyer. Protect from light and moisture. Keep tightly closed. Throw away any unused medicine after the expiration date. NOTE: This sheet is a summary. It may not cover all possible information. If you have questions about this medicine, talk to your doctor, pharmacist, or health care provider.  2021 Elsevier/Gold Standard (2018-02-02 16:46:32)

## 2022-12-02 NOTE — Addendum Note (Signed)
Addended by: Eleonore Chiquito on: 12/02/2022 03:43 PM   Modules accepted: Orders

## 2022-12-03 ENCOUNTER — Telehealth: Payer: Self-pay | Admitting: *Deleted

## 2022-12-03 LAB — CBC
Hematocrit: 38.7 % (ref 34.0–46.6)
Hemoglobin: 12 g/dL (ref 11.1–15.9)
MCH: 24.8 pg — ABNORMAL LOW (ref 26.6–33.0)
MCHC: 31 g/dL — ABNORMAL LOW (ref 31.5–35.7)
MCV: 80 fL (ref 79–97)
Platelets: 149 10*3/uL — ABNORMAL LOW (ref 150–450)
RBC: 4.83 x10E6/uL (ref 3.77–5.28)
RDW: 15.8 % — ABNORMAL HIGH (ref 11.7–15.4)
WBC: 6.2 10*3/uL (ref 3.4–10.8)

## 2022-12-03 LAB — BASIC METABOLIC PANEL
BUN/Creatinine Ratio: 17 (ref 12–28)
BUN: 17 mg/dL (ref 8–27)
CO2: 27 mmol/L (ref 20–29)
Calcium: 9.4 mg/dL (ref 8.7–10.3)
Chloride: 100 mmol/L (ref 96–106)
Creatinine, Ser: 0.99 mg/dL (ref 0.57–1.00)
Glucose: 99 mg/dL (ref 70–99)
Potassium: 4.1 mmol/L (ref 3.5–5.2)
Sodium: 142 mmol/L (ref 134–144)
eGFR: 62 mL/min/{1.73_m2} (ref >59)

## 2022-12-03 NOTE — Telephone Encounter (Signed)
Reviewed procedure instructions with patient.  

## 2022-12-03 NOTE — Telephone Encounter (Addendum)
Cardiac Catheterization scheduled at [facility] for: Monday December 07, 2022 7:30 AM Arrival time [facility] Main Entrance A at: 5:30 AM  Nothing to eat after midnight prior to procedure, clear liquids until 5 AM day of procedure.  Medication instructions: -Hold:  Xarelto-none 12/05/22 until post procedure Torsemide-AM of procedure  Metformin-day of procedure and 48 hours post procedure  Jardiance-AM of procedure -Other usual morning medications can be taken with sips of water including aspirin 81 mg.  Confirmed patient has responsible adult to drive home post procedure and be with patient first 24 hours after arriving home.  Plan to go home the same day, you will only stay overnight if medically necessary.  Reviewed procedure instructions with patient.

## 2022-12-03 NOTE — Telephone Encounter (Signed)
Call placed to patient to review procedure instructions, no answer. ?

## 2022-12-07 ENCOUNTER — Ambulatory Visit (HOSPITAL_COMMUNITY)
Admission: RE | Admit: 2022-12-07 | Discharge: 2022-12-07 | Disposition: A | Discharge status: Home / Self Care | Payer: PPO | Attending: Internal Medicine | Admitting: Internal Medicine

## 2022-12-07 ENCOUNTER — Encounter (HOSPITAL_COMMUNITY)
Admission: RE | Disposition: A | Discharge status: Home / Self Care | Payer: Self-pay | Source: Home / Self Care | Attending: Internal Medicine

## 2022-12-07 DIAGNOSIS — I350 Nonrheumatic aortic (valve) stenosis: Secondary | ICD-10-CM | POA: Insufficient documentation

## 2022-12-07 DIAGNOSIS — I480 Paroxysmal atrial fibrillation: Secondary | ICD-10-CM | POA: Diagnosis not present

## 2022-12-07 DIAGNOSIS — E785 Hyperlipidemia, unspecified: Secondary | ICD-10-CM

## 2022-12-07 DIAGNOSIS — I110 Hypertensive heart disease with heart failure: Secondary | ICD-10-CM | POA: Insufficient documentation

## 2022-12-07 DIAGNOSIS — I2510 Atherosclerotic heart disease of native coronary artery without angina pectoris: Secondary | ICD-10-CM | POA: Diagnosis not present

## 2022-12-07 DIAGNOSIS — Z7984 Long term (current) use of oral hypoglycemic drugs: Secondary | ICD-10-CM | POA: Insufficient documentation

## 2022-12-07 DIAGNOSIS — Z6839 Body mass index (BMI) 39.0-39.9, adult: Secondary | ICD-10-CM | POA: Diagnosis not present

## 2022-12-07 DIAGNOSIS — E1151 Type 2 diabetes mellitus with diabetic peripheral angiopathy without gangrene: Secondary | ICD-10-CM | POA: Diagnosis not present

## 2022-12-07 DIAGNOSIS — I5032 Chronic diastolic (congestive) heart failure: Secondary | ICD-10-CM | POA: Insufficient documentation

## 2022-12-07 DIAGNOSIS — R0609 Other forms of dyspnea: Secondary | ICD-10-CM | POA: Insufficient documentation

## 2022-12-07 DIAGNOSIS — E782 Mixed hyperlipidemia: Secondary | ICD-10-CM | POA: Insufficient documentation

## 2022-12-07 DIAGNOSIS — I10 Essential (primary) hypertension: Secondary | ICD-10-CM

## 2022-12-07 DIAGNOSIS — Z7722 Contact with and (suspected) exposure to environmental tobacco smoke (acute) (chronic): Secondary | ICD-10-CM | POA: Diagnosis not present

## 2022-12-07 HISTORY — PX: RIGHT/LEFT HEART CATH AND CORONARY ANGIOGRAPHY: CATH118266

## 2022-12-07 LAB — POCT I-STAT 7, (LYTES, BLD GAS, ICA,H+H)
Acid-base deficit: 2 mmol/L (ref 0.0–2.0)
Bicarbonate: 22.2 mmol/L (ref 20.0–28.0)
Calcium, Ion: 1.12 mmol/L — ABNORMAL LOW (ref 1.15–1.40)
HCT: 34 % — ABNORMAL LOW (ref 36.0–46.0)
Hemoglobin: 11.6 g/dL — ABNORMAL LOW (ref 12.0–15.0)
O2 Saturation: 94 %
Potassium: 4.4 mmol/L (ref 3.5–5.1)
Sodium: 141 mmol/L (ref 135–145)
TCO2: 23 mmol/L (ref 22–32)
pCO2 arterial: 35 mmHg (ref 32–48)
pH, Arterial: 7.41 (ref 7.35–7.45)
pO2, Arterial: 68 mmHg — ABNORMAL LOW (ref 83–108)

## 2022-12-07 LAB — POCT I-STAT EG7
Acid-base deficit: 1 mmol/L (ref 0.0–2.0)
Bicarbonate: 23.8 mmol/L (ref 20.0–28.0)
Calcium, Ion: 1.15 mmol/L (ref 1.15–1.40)
HCT: 35 % — ABNORMAL LOW (ref 36.0–46.0)
Hemoglobin: 11.9 g/dL — ABNORMAL LOW (ref 12.0–15.0)
O2 Saturation: 67 %
Potassium: 4.3 mmol/L (ref 3.5–5.1)
Sodium: 141 mmol/L (ref 135–145)
TCO2: 25 mmol/L (ref 22–32)
pCO2, Ven: 40.8 mmHg — ABNORMAL LOW (ref 44–60)
pH, Ven: 7.373 (ref 7.25–7.43)
pO2, Ven: 36 mmHg (ref 32–45)

## 2022-12-07 LAB — GLUCOSE, CAPILLARY: Glucose-Capillary: 140 mg/dL — ABNORMAL HIGH (ref 70–99)

## 2022-12-07 SURGERY — RIGHT/LEFT HEART CATH AND CORONARY ANGIOGRAPHY
Anesthesia: LOCAL

## 2022-12-07 MED ORDER — SODIUM CHLORIDE 0.9 % WEIGHT BASED INFUSION: 1.0000 mL/kg/h | INTRAVENOUS | Status: DC

## 2022-12-07 MED ORDER — ASPIRIN 81 MG PO CHEW
81.0000 mg | CHEWABLE_TABLET | ORAL | Status: AC
  Administered 2022-12-07: 81 mg via ORAL
  Filled 2022-12-07: qty 1

## 2022-12-07 MED ORDER — SODIUM CHLORIDE 0.9 % IV SOLN: 250.0000 mL | INTRAVENOUS | Status: DC | PRN

## 2022-12-07 MED ORDER — ASPIRIN 81 MG PO CHEW: 81.0000 mg | CHEWABLE_TABLET | ORAL | Status: DC

## 2022-12-07 MED ORDER — HEPARIN SODIUM (PORCINE) 1000 UNIT/ML IJ SOLN
INTRAMUSCULAR | Status: DC | PRN
  Administered 2022-12-07: 4500 [IU] via INTRAVENOUS

## 2022-12-07 MED ORDER — LIDOCAINE HCL (PF) 1 % IJ SOLN
INTRAMUSCULAR | Status: DC | PRN
  Administered 2022-12-07: 2 mL
  Administered 2022-12-07: 4 mL

## 2022-12-07 MED ORDER — MIDAZOLAM HCL 2 MG/2ML IJ SOLN
INTRAMUSCULAR | Status: DC | PRN
  Administered 2022-12-07: 1 mg via INTRAVENOUS

## 2022-12-07 MED ORDER — FENTANYL CITRATE (PF) 100 MCG/2ML IJ SOLN
INTRAMUSCULAR | Status: DC | PRN
  Administered 2022-12-07: 25 ug via INTRAVENOUS

## 2022-12-07 MED ORDER — IOHEXOL 350 MG/ML SOLN
INTRAVENOUS | Status: DC | PRN
  Administered 2022-12-07: 39 mL

## 2022-12-07 MED ORDER — VERAPAMIL HCL 2.5 MG/ML IV SOLN
INTRAVENOUS | Status: AC
  Filled 2022-12-07: qty 2

## 2022-12-07 MED ORDER — LABETALOL HCL 5 MG/ML IV SOLN: 10.0000 mg | INTRAVENOUS | Status: DC | PRN

## 2022-12-07 MED ORDER — LIDOCAINE HCL (PF) 1 % IJ SOLN
INTRAMUSCULAR | Status: AC
  Filled 2022-12-07: qty 30

## 2022-12-07 MED ORDER — SODIUM CHLORIDE 0.9 % WEIGHT BASED INFUSION
3.0000 mL/kg/h | INTRAVENOUS | Status: AC
  Administered 2022-12-07: 3 mL/kg/h via INTRAVENOUS

## 2022-12-07 MED ORDER — HEPARIN (PORCINE) IN NACL 1000-0.9 UT/500ML-% IV SOLN
INTRAVENOUS | Status: DC | PRN
  Administered 2022-12-07 (×3): 500 mL

## 2022-12-07 MED ORDER — ONDANSETRON HCL 4 MG/2ML IJ SOLN: 4.0000 mg | Freq: Four times a day (QID) | INTRAMUSCULAR | Status: DC | PRN

## 2022-12-07 MED ORDER — TORSEMIDE 20 MG PO TABS: ORAL_TABLET | ORAL | 0 refills | Status: DC

## 2022-12-07 MED ORDER — SODIUM CHLORIDE 0.9% FLUSH: 3.0000 mL | Freq: Two times a day (BID) | INTRAVENOUS | Status: DC

## 2022-12-07 MED ORDER — ACETAMINOPHEN 325 MG PO TABS: 650.0000 mg | ORAL_TABLET | ORAL | Status: DC | PRN

## 2022-12-07 MED ORDER — MIDAZOLAM HCL 2 MG/2ML IJ SOLN
INTRAMUSCULAR | Status: AC
  Filled 2022-12-07: qty 2

## 2022-12-07 MED ORDER — FENTANYL CITRATE (PF) 100 MCG/2ML IJ SOLN
INTRAMUSCULAR | Status: AC
  Filled 2022-12-07: qty 2

## 2022-12-07 MED ORDER — SODIUM CHLORIDE 0.9% FLUSH: 3.0000 mL | INTRAVENOUS | Status: DC | PRN

## 2022-12-07 MED ORDER — HYDRALAZINE HCL 20 MG/ML IJ SOLN: 10.0000 mg | INTRAMUSCULAR | Status: DC | PRN

## 2022-12-07 MED ORDER — VERAPAMIL HCL 2.5 MG/ML IV SOLN
INTRAVENOUS | Status: DC | PRN
  Administered 2022-12-07: 10 mL via INTRA_ARTERIAL

## 2022-12-07 MED ORDER — HEPARIN SODIUM (PORCINE) 1000 UNIT/ML IJ SOLN
INTRAMUSCULAR | Status: AC
  Filled 2022-12-07: qty 10

## 2022-12-07 SURGICAL SUPPLY — 14 items
CATH 5FR JL3.5 JR4 ANG PIG MP (CATHETERS) IMPLANT
CATH BALLN WEDGE 5F 110CM (CATHETERS) IMPLANT
DEVICE RAD COMP TR BAND LRG (VASCULAR PRODUCTS) IMPLANT
GLIDESHEATH SLEND SS 6F .021 (SHEATH) IMPLANT
GUIDEWIRE INQWIRE 1.5J.035X260 (WIRE) IMPLANT
INQWIRE 1.5J .035X260CM (WIRE) ×1
KIT HEART LEFT (KITS) ×1 IMPLANT
KIT SYRINGE INJ CVI SPIKEX1 (MISCELLANEOUS) IMPLANT
PACK CARDIAC CATHETERIZATION (CUSTOM PROCEDURE TRAY) ×1 IMPLANT
SET ATX-X65L (MISCELLANEOUS) IMPLANT
SHEATH GLIDE SLENDER 4/5FR (SHEATH) IMPLANT
TRANSDUCER W/STOPCOCK (MISCELLANEOUS) ×1 IMPLANT
TUBING CIL FLEX 10 FLL-RA (TUBING) ×1 IMPLANT
WIRE EMERALD ST .035X260CM (WIRE) IMPLANT

## 2022-12-07 NOTE — Interval H&P Note (Signed)
History and Physical Interval Note:  12/07/2022 7:19 AM  Crystal Ramos  has presented today for surgery, with the diagnosis of aortic stenosis.  The various methods of treatment have been discussed with the patient and family. After consideration of risks, benefits and other options for treatment, the patient has consented to  Procedure(s): RIGHT/LEFT HEART CATH AND CORONARY ANGIOGRAPHY (N/A) as a surgical intervention.  The patient's history has been reviewed, patient examined, no change in status, stable for surgery.  I have reviewed the patient's chart and labs.  Questions were answered to the patient's satisfaction.    Cath Lab Visit (complete for each Cath Lab visit)  Clinical Evaluation Leading to the Procedure:   ACS: No.  Non-ACS:    Anginal/Heart Failure Classification: NYHA class III  Anti-ischemic medical therapy: Minimal Therapy (1 class of medications)  Non-Invasive Test Results: No non-invasive testing performed  Prior CABG: No previous CABG  Crystal Ramos

## 2022-12-07 NOTE — Brief Op Note (Signed)
BRIEF CARDIAC CATHETERIZATION NOTE  12/07/2022  8:53 AM  PATIENT:  Crystal Ramos  67 y.o. female  PRE-OPERATIVE DIAGNOSIS:  Aortic stenosis  POST-OPERATIVE DIAGNOSIS:  Same  PROCEDURE:  Procedure(s): RIGHT/LEFT HEART CATH AND CORONARY ANGIOGRAPHY (N/A)  SURGEON:  Surgeons and Role:    Yvonne Kendall, MD - Primary  FINDINGS: Non-obstructive CAD. Mildly-moderately elevated left heart filling pressures.  Prominent v-waves on PCWP and PWCP/LVEDP discrepancy raise possibility of significant MR. Severely elevated right heart and pulmonary artery pressures. Normal Fick cardiac output/index. Moderate to severe aortic valve stenosis.  RECOMMENDATIONS: Increase torsemide to 40 mg QAM and 20 mg QPM. Restart rivaroxaban tomorrow if no evidence of bleeding/vascular injury. Consider outpatient structural heart and advanced heart failure consultations.  Yvonne Kendall, MD [facility]

## 2022-12-08 ENCOUNTER — Telehealth: Payer: Self-pay

## 2022-12-08 ENCOUNTER — Encounter (HOSPITAL_COMMUNITY): Payer: Self-pay | Admitting: Internal Medicine

## 2022-12-08 DIAGNOSIS — I350 Nonrheumatic aortic (valve) stenosis: Secondary | ICD-10-CM

## 2022-12-08 DIAGNOSIS — I5032 Chronic diastolic (congestive) heart failure: Secondary | ICD-10-CM

## 2022-12-08 NOTE — Telephone Encounter (Signed)
-----   Message from Aundra Dubin Revankar sent at 12/07/2022  5:58 PM EDT ----- Consult with structural and advanced heart failure team. See reco #[id number] in report cc pcp Garwin Brothers, MD 12/07/2022 5:57 PM

## 2022-12-15 DIAGNOSIS — H259 Unspecified age-related cataract: Secondary | ICD-10-CM | POA: Diagnosis not present

## 2022-12-15 DIAGNOSIS — H25812 Combined forms of age-related cataract, left eye: Secondary | ICD-10-CM | POA: Diagnosis not present

## 2022-12-15 DIAGNOSIS — I4891 Unspecified atrial fibrillation: Secondary | ICD-10-CM | POA: Diagnosis not present

## 2022-12-15 DIAGNOSIS — E1136 Type 2 diabetes mellitus with diabetic cataract: Secondary | ICD-10-CM | POA: Diagnosis not present

## 2022-12-15 DIAGNOSIS — H25811 Combined forms of age-related cataract, right eye: Secondary | ICD-10-CM | POA: Diagnosis not present

## 2022-12-15 DIAGNOSIS — E119 Type 2 diabetes mellitus without complications: Secondary | ICD-10-CM | POA: Diagnosis not present

## 2022-12-15 DIAGNOSIS — I10 Essential (primary) hypertension: Secondary | ICD-10-CM | POA: Diagnosis not present

## 2022-12-16 ENCOUNTER — Encounter: Payer: Self-pay | Admitting: Cardiology

## 2022-12-22 DIAGNOSIS — E039 Hypothyroidism, unspecified: Secondary | ICD-10-CM | POA: Diagnosis not present

## 2022-12-22 DIAGNOSIS — I10 Essential (primary) hypertension: Secondary | ICD-10-CM | POA: Diagnosis not present

## 2022-12-22 DIAGNOSIS — E785 Hyperlipidemia, unspecified: Secondary | ICD-10-CM | POA: Diagnosis not present

## 2022-12-22 DIAGNOSIS — E1169 Type 2 diabetes mellitus with other specified complication: Secondary | ICD-10-CM | POA: Diagnosis not present

## 2022-12-22 DIAGNOSIS — I5032 Chronic diastolic (congestive) heart failure: Secondary | ICD-10-CM | POA: Diagnosis not present

## 2022-12-22 DIAGNOSIS — Z139 Encounter for screening, unspecified: Secondary | ICD-10-CM | POA: Diagnosis not present

## 2022-12-22 DIAGNOSIS — Z1331 Encounter for screening for depression: Secondary | ICD-10-CM | POA: Diagnosis not present

## 2022-12-22 DIAGNOSIS — I350 Nonrheumatic aortic (valve) stenosis: Secondary | ICD-10-CM | POA: Diagnosis not present

## 2022-12-22 DIAGNOSIS — D509 Iron deficiency anemia, unspecified: Secondary | ICD-10-CM | POA: Diagnosis not present

## 2022-12-22 DIAGNOSIS — I480 Paroxysmal atrial fibrillation: Secondary | ICD-10-CM | POA: Diagnosis not present

## 2022-12-22 DIAGNOSIS — Z79899 Other long term (current) drug therapy: Secondary | ICD-10-CM | POA: Diagnosis not present

## 2022-12-22 NOTE — Progress Notes (Unsigned)
Patient ID: Crystal Ramos MRN: [id number] DOB/AGE: Apr 15, 1956 67 y.o.  Primary Care Physician:Schultz, Jonetta Speak, MD Primary Cardiologist: Belva Crome, MD  FOCUSED CARDIOVASCULAR PROBLEM LIST:   1.  Severe aortic stenosis with a aortic valve area 0.48 cm grade, mean gradient of 41 mmHg, and peak velocity of 4 m/s with ejection fraction of 60 to 65% on outside hospital echocardiogram; EKG with sinus rhythm with first-degree AV block 2.  Paroxysmal atrial fibrillation on Xarelto 3.  Type 2 diabetes mellitus not on insulin 4.  Hypertension 5.  BMI of 39   HISTORY OF PRESENT ILLNESS: The patient is a 67 y.o. female with the indicated medical history here for recommendations regarding her severe arctic stenosis and dyspnea.  The patient was seen recently by her primary cardiologist Dr. Tomie China.  She reported increasing dyspnea on exertion.  An outside echocardiogram demonstrated severe aortic stenosis with a preserved ejection fraction.  She underwent cardiac catheterization which demonstrated mild nonobstructive disease.  She had elevated filling pressures with a mean RA pressure of 17 mmHg and a wedge pressure of 32 mmHg.  She is here for recommendations regarding her aortic valvular disease and ongoing dyspnea.  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   Dyslipidemia 02/25/2017   Dyspnea on  exertion 12/18/2015   Epistaxis 06/26/2021   Essential hypertension    Hematuria 11/29/2017   Morbid obesity (HCC) 02/25/2017   Osteopenia after menopause 12/01/2019   Paroxysmal atrial fibrillation (HCC) 02/25/2017   Psoriasis 07/31/2020   Type 2 diabetes mellitus without complication (HCC) 12/18/2015    Past Surgical History:  Procedure Laterality Date   CHOLECYSTECTOMY     LEFT HEART CATH AND CORONARY ANGIOGRAPHY N/A 04/03/2019   Procedure: LEFT HEART CATH AND CORONARY ANGIOGRAPHY;  Surgeon: Kathleene Hazel, MD;  Location: [facility];  Service: Cardiovascular;  Laterality: N/A;   MEDIAL PARTIAL KNEE REPLACEMENT     RIGHT/LEFT HEART CATH AND CORONARY ANGIOGRAPHY N/A 12/07/2022   Procedure: RIGHT/LEFT HEART CATH AND CORONARY ANGIOGRAPHY;  Surgeon: Yvonne Kendall, MD;  Location: [facility] INVASIVE CV LAB;  Service: Cardiovascular;  Laterality: N/A;    Family History  Problem Relation Age of Onset   Diabetes Mother    Stroke Father    Stroke Paternal Aunt    Breast cancer Paternal Aunt     Social History   Socioeconomic History   Marital status: Married    Spouse name: Not on file   Number of children: Not on file   Years of education: Not on file   Highest education level: Not on file  Occupational History   Not on file  Tobacco Use   Smoking status: Never    Passive exposure: Yes   Smokeless tobacco: Never   Tobacco comments:    "whole family" smoked in the house  Vaping Use   Vaping status: Never Used  Substance and Sexual Activity   Alcohol use:  No   Drug use: No   Sexual activity: Not on file  Other Topics Concern   Not on file  Social History Narrative   Not on file   Social Determinants of Health   Financial Resource Strain: Not on file  Food Insecurity: Not on file  Transportation Needs: Not on file  Physical Activity: Not on file  Stress: Not on file  Social Connections: Not on file  Intimate Partner Violence: Not on file     Prior to Admission  medications   Medication Sig Start Date End Date Taking? Authorizing Provider  acetaminophen (TYLENOL) 500 MG tablet Take 1,000 mg by mouth every 6 (six) hours as needed for moderate pain.    [provider]  ALPRAZolam Prudy Feeler) 0.25 MG tablet Take 0.25 mg by mouth at bedtime.  07/26/18   [provider]  amiodarone (PACERONE) 200 MG tablet Take 0.5 tablets (100 mg total) by mouth 2 (two) times daily. 06/29/22   Revankar, Aundra Dubin, MD  ezetimibe (ZETIA) 10 MG tablet Take 10 mg by mouth daily. 03/29/22   [provider]  JARDIANCE 25 MG TABS tablet Take 25 mg by mouth daily. 01/29/20   [provider]  levothyroxine (SYNTHROID) 88 MCG tablet Take 88 mcg by mouth daily before breakfast.    [provider]  metFORMIN (GLUCOPHAGE) 850 MG tablet Take 850 mg by mouth 3 (three) times daily.    [provider]  metoprolol succinate (TOPROL-XL) 25 MG 24 hr tablet Take 1 tablet (25 mg total) by mouth 2 (two) times daily. 07/22/22   Revankar, Aundra Dubin, MD  prednisoLONE acetate (PRED FORTE) 1 % ophthalmic suspension Place 1 drop into the right eye 2 (two) times daily.    [provider]  raloxifene (EVISTA) 60 MG tablet Take 1 tablet by mouth once daily 10/23/22   [name], MD  rivaroxaban (XARELTO) 20 MG TABS tablet Take 20 mg by mouth daily with supper.    [provider]  torsemide (DEMADEX) 20 MG tablet Take 40 mg (2 tablets) every morning and 20 mg (1 tablet) every afternoon. 12/07/22   End, Cristal Deer, MD  traMADol (ULTRAM) 50 MG tablet Take 50 mg by mouth at bedtime. 01/11/20   [provider]    No Known Allergies  REVIEW OF SYSTEMS:  General: no fevers/chills/night sweats Eyes: no blurry vision, diplopia, or amaurosis ENT: no sore throat or hearing loss Resp: no cough, wheezing, or hemoptysis CV: no edema or palpitations GI: no abdominal pain, nausea, vomiting, diarrhea, or constipation GU: no dysuria, frequency,  or hematuria Skin: no rash Neuro: no headache, numbness, tingling, or weakness of extremities Musculoskeletal: no joint pain or swelling Heme: no bleeding, DVT, or easy bruising Endo: no polydipsia or polyuria  There were no vitals taken for this visit.  PHYSICAL EXAM: GEN:  AO x 3 in no acute distress HEENT: normal Dentition: Normal*** Neck: JVP normal. +2***carotid upstrokes without bruits. No thyromegaly. Lungs: equal expansion, clear bilaterally CV: Apex is discrete and nondisplaced, RRR without murmur or gallop*** Abd: soft, non-tender, non-distended; no bruit; positive bowel sounds Ext: no edema, ecchymoses, or cyanosis Vascular: 2+ femoral pulses, 2+ radial pulses       Skin: warm and dry without rash Neuro: CN II-XII grossly intact; motor and sensory grossly intact    DATA AND STUDIES:  EKG:     Sinus rhythm with first-degree AV block  Cardiac Studies & Procedures   CARDIAC CATHETERIZATION  CARDIAC CATHETERIZATION 12/07/2022  Narrative Conclusions: Mild to moderate, non-obstructive coronary artery disease with up to 50% stenosis of the mid LAD. Mildly to moderately elevated left heart filling pressures (LVEDP 20 mmHg, PCWP 32 mm).  Prominent v-waves on PCWP tracing and discrepancy in pressures between PCWP and LVEDP suggest underling mitral valve disease. Severely elevated right heart and pulmonary artery pressures (RA 17 mmHg, mean PA 48 mmHg). Normal Fick cardiac output/index. Moderate to severe aortic valve stenosis (mean gradient 36 mmHg, valve area 0.8 cm^2).  Recommendations: Escalate diuresis. Restart rivaroxaban tomorrow if no evidence of bleeding/vascular injury at catheterization sites. Consultation with structural heart and advanced heart failure teams should be considered.  Yvonne Kendall, MD [facility]  Findings Coronary Findings Diagnostic  Dominance: Right  Left Main Vessel is large. Vessel is angiographically normal.  Left Anterior  Descending Vessel is large. Mid LAD lesion is 50% stenosed. The lesion is irregular. The lesion is mildly calcified.  First Diagonal Branch Vessel is moderate in size.  Ramus Intermedius Vessel is moderate in size. Ramus lesion is 30% stenosed.  Left Circumflex Vessel is large. Ost Cx to Prox Cx lesion is 30% stenosed.  First Obtuse Marginal Branch Vessel is small in size.  Second Obtuse Marginal Branch Vessel is moderate in size.  Third Obtuse Marginal Branch Vessel is moderate in size.  Right Coronary Artery Vessel is large. Ost RCA lesion is 30% stenosed.  Right Ventricular Branch RV Branch lesion is 95% stenosed.  Intervention  No interventions have been documented.   CARDIAC CATHETERIZATION  CARDIAC CATHETERIZATION 04/03/2019  Narrative  RV Branch lesion is 95% stenosed.  Ost Cx to Prox Cx lesion is 30% stenosed.  Ramus lesion is 30% stenosed.  Mid LAD-1 lesion is 30% stenosed.  Mid LAD-2 lesion is 50% stenosed.  Mid LAD to Dist LAD lesion is 30% stenosed.  1. Mild to moderate non-obstructive disease in the mid LAD 2. Mild non-obstructive disease in the intermediate branch and the Circumflex 3. The RCA is a large dominant vessel with no obstructive in the major segment or distal branches. The small RV marginal branch has a 95% ostial stenosis (too small for PCI). 4. Elevated LVEDP  Recommendations: Medical management of CAD. I would increase her Lasix dosage to 40 mg po BID. She was given Lasix 40 mg IV x 1 in the cath lab.  Findings Coronary Findings Diagnostic  Dominance: Right  Left Anterior Descending Vessel is large. Mid LAD-1 lesion is 30% stenosed. Mid LAD-2 lesion is 50% stenosed. Mid LAD to Dist LAD lesion is 30% stenosed.  Ramus Intermedius Vessel is moderate in size. Ramus lesion is 30% stenosed.  Left Circumflex Vessel is large. Ost Cx to Prox Cx lesion is 30% stenosed.  Right Coronary Artery Vessel is large.  Right  Ventricular Branch RV Branch lesion is 95% stenosed.  Intervention  No interventions have been documented.     ECHOCARDIOGRAM  ECHOCARDIOGRAM COMPLETE 08/26/2021  Narrative ECHOCARDIOGRAM REPORT    Patient Name:   Sumiye ANN COX Dy Date of Exam: 08/26/2021 Medical Rec #:  [id number]            Height:       61.0 in Accession #:    [id number]           Weight:       202.8 lb Date of Birth:  [birth year]            BSA:          1.900 m Patient Age:  65 years             BP:           132/62 mmHg Patient Gender: F                    HR:           80 bpm. Exam Location:  [place]  Procedure: 2D Echo, Cardiac Doppler and Color Doppler  Indications:    Atherosclerotic vascular disease [I70.90 (ICD-10-CM)]  History:        Patient has prior history of Echocardiogram examinations, most recent 03/20/2020. CHF, CAD, Arrythmias:Atrial Fibrillation; Risk Factors:Hypertension.  Sonographer:    Margreta Journey RDCS Referring Phys: Rito Ehrlich [facility]   Sonographer Comments: Image acquisition challenging due to patient body habitus. IMPRESSIONS   1. TDS secondary to tachycardia. Left ventricular ejection fraction, by estimation, is 55 to 60%. The left ventricle has normal function. The left ventricle has no regional wall motion abnormalities. Left ventricular diastolic parameters are indeterminate. 2. Right ventricular systolic function is normal. The right ventricular size is normal. 3. The mitral valve is normal in structure. Trivial mitral valve regurgitation. No evidence of mitral stenosis. 4. The aortic valve is normal in structure. There is mild calcification of the aortic valve. There is mild thickening of the aortic valve. Aortic valve regurgitation is not visualized. Mild to moderate aortic valve stenosis. Aortic valve area, by VTI measures 1.10 cm. Aortic valve mean gradient measures 14.0 mmHg. 5. The inferior vena cava is normal in size with greater than 50% respiratory  variability, suggesting right atrial pressure of 3 mmHg.  FINDINGS Left Ventricle: TDS secondary to tachycardia. Left ventricular ejection fraction, by estimation, is 55 to 60%. The left ventricle has normal function. The left ventricle has no regional wall motion abnormalities. The left ventricular internal cavity size was normal in size. There is no left ventricular hypertrophy. Left ventricular diastolic parameters are indeterminate.  Right Ventricle: The right ventricular size is normal. No increase in right ventricular wall thickness. Right ventricular systolic function is normal.  Left Atrium: Left atrial size was normal in size.  Right Atrium: Right atrial size was normal in size.  Pericardium: There is no evidence of pericardial effusion.  Mitral Valve: The mitral valve is normal in structure. Mild mitral annular calcification. Trivial mitral valve regurgitation. No evidence of mitral valve stenosis.  Tricuspid Valve: The tricuspid valve is normal in structure. Tricuspid valve regurgitation is not demonstrated. No evidence of tricuspid stenosis.  Aortic Valve: The aortic valve is normal in structure. There is mild calcification of the aortic valve. There is mild thickening of the aortic valve. Aortic valve regurgitation is not visualized. Mild to moderate aortic stenosis is present. Aortic valve mean gradient measures 14.0 mmHg. Aortic valve peak gradient measures 19.3 mmHg. Aortic valve area, by VTI measures 1.10 cm.  Pulmonic Valve: The pulmonic valve was normal in structure. Pulmonic valve regurgitation is not visualized. No evidence of pulmonic stenosis.  Aorta: The aortic root is normal in size and structure.  Venous: The inferior vena cava is normal in size with greater than 50% respiratory variability, suggesting right atrial pressure of 3 mmHg.  IAS/Shunts: No atrial level shunt detected by color flow Doppler.   LEFT VENTRICLE PLAX 2D LVIDd:         4.00 cm LVIDs:          2.90 cm LV PW:         1.30 cm LV IVS:  1.30 cm LVOT diam:     1.90 cm LV SV:         42 LV SV Index:   22 LVOT Area:     2.84 cm   RIGHT VENTRICLE RV Basal diam:  2.60 cm RV S prime:     9.32 cm/s TAPSE (M-mode): 2.1 cm  LEFT ATRIUM             Index        RIGHT ATRIUM           Index LA diam:        4.60 cm 2.42 cm/m   RA Area:     15.80 cm LA Vol (A2C):   71.8 ml 37.79 ml/m  RA Volume:   31.50 ml  16.58 ml/m LA Vol (A4C):   52.7 ml 27.74 ml/m LA Biplane Vol: 62.4 ml 32.84 ml/m AORTIC VALVE AV Area (Vmax):    1.01 cm AV Area (Vmean):   0.86 cm AV Area (VTI):     1.10 cm AV Vmax:           219.60 cm/s AV Vmean:          176.800 cm/s AV VTI:            0.381 m AV Peak Grad:      19.3 mmHg AV Mean Grad:      14.0 mmHg LVOT Vmax:         78.34 cm/s LVOT Vmean:        53.380 cm/s LVOT VTI:          0.148 m LVOT/AV VTI ratio: 0.39  AORTA Ao Root diam: 3.10 cm Ao Asc diam:  3.70 cm  MITRAL VALVE MV Area (PHT): 3.48 cm     SHUNTS MV Decel Time: 218 msec     Systemic VTI:  0.15 m MV E velocity: 112.00 cm/s  Systemic Diam: 1.90 cm  Gypsy Balsam MD Electronically signed by Gypsy Balsam MD Signature Date/Time: 08/26/2021/2:09:01 PM    Final             07/22/2022: ALT 17 11/20/2022: NT-Pro BNP 280 12/02/2022: BUN 17; Creatinine, Ser 0.99; Platelets 149 12/07/2022: Hemoglobin 11.6; Potassium 4.4; Sodium 141   STS RISK CALCULATOR: Pending  NHYA CLASS: ***    ASSESSMENT AND PLAN:   Severe aortic stenosis  Chronic diastolic heart failure (HCC)  Paroxysmal atrial fibrillation (HCC)  Essential hypertension  Type 2 diabetes mellitus without complication, without long-term current use of insulin (HCC)  Hypertension associated with diabetes (HCC)  Hyperlipidemia associated with type 2 diabetes mellitus (HCC)  BMI 39.0-39.9,adult   I have personally reviewed the patients imaging data as summarized above.  I have reviewed the  natural history of aortic stenosis with the patient and family members who are present today. We have discussed the limitations of medical therapy and the poor prognosis associated with symptomatic aortic stenosis. We have also reviewed potential treatment options, including palliative medical therapy, conventional surgical aortic valve replacement, and transcatheter aortic valve replacement. We discussed treatment options in the context of this patient's specific comorbid medical conditions.   All of the patient's questions were answered today. Will make further recommendations based on the results of studies outlined above.   Total time spent with patient today *** minutes. This includes reviewing records, evaluating the patient and coordinating care.   Orbie Pyo, MD  12/22/2022 6:38 AM    [facility] [address] [place], [place],  [place]  [address] Phone: [phone]; Fax: [phone]

## 2022-12-23 ENCOUNTER — Encounter: Payer: Self-pay | Admitting: Internal Medicine

## 2022-12-23 ENCOUNTER — Ambulatory Visit: Payer: PPO | Attending: Internal Medicine | Admitting: Internal Medicine

## 2022-12-23 ENCOUNTER — Other Ambulatory Visit: Payer: Self-pay

## 2022-12-23 VITALS — BP 110/60 | HR 58 | Ht 61.0 in | Wt 210.0 lb

## 2022-12-23 DIAGNOSIS — I152 Hypertension secondary to endocrine disorders: Secondary | ICD-10-CM | POA: Diagnosis not present

## 2022-12-23 DIAGNOSIS — E1169 Type 2 diabetes mellitus with other specified complication: Secondary | ICD-10-CM | POA: Diagnosis not present

## 2022-12-23 DIAGNOSIS — I10 Essential (primary) hypertension: Secondary | ICD-10-CM | POA: Diagnosis not present

## 2022-12-23 DIAGNOSIS — I350 Nonrheumatic aortic (valve) stenosis: Secondary | ICD-10-CM

## 2022-12-23 DIAGNOSIS — I5032 Chronic diastolic (congestive) heart failure: Secondary | ICD-10-CM | POA: Diagnosis not present

## 2022-12-23 DIAGNOSIS — E785 Hyperlipidemia, unspecified: Secondary | ICD-10-CM | POA: Diagnosis not present

## 2022-12-23 DIAGNOSIS — E1159 Type 2 diabetes mellitus with other circulatory complications: Secondary | ICD-10-CM

## 2022-12-23 DIAGNOSIS — Z7984 Long term (current) use of oral hypoglycemic drugs: Secondary | ICD-10-CM

## 2022-12-23 DIAGNOSIS — Z6839 Body mass index (BMI) 39.0-39.9, adult: Secondary | ICD-10-CM

## 2022-12-23 DIAGNOSIS — I480 Paroxysmal atrial fibrillation: Secondary | ICD-10-CM

## 2022-12-23 DIAGNOSIS — E119 Type 2 diabetes mellitus without complications: Secondary | ICD-10-CM | POA: Diagnosis not present

## 2022-12-23 NOTE — Patient Instructions (Signed)
Medication Instructions:  No changes *If you need a refill on your cardiac medications before your next appointment, please call your pharmacy*   Lab Work: none If you have labs (blood work) drawn today and your tests are completely normal, you will receive your results only by: MyChart Message (if you have MyChart) OR A paper copy in the mail If you have any lab test that is abnormal or we need to change your treatment, we will call you to review the results.   Testing/Procedures: CT Scans - see instruction letter   Follow-Up: Per Structural Heart Team

## 2022-12-23 NOTE — Progress Notes (Addendum)
Pre Surgical Assessment: 5 M Walk Test  56M=16.37ft  5 Meter Walk Test- trial 1: 9.59 seconds 5 Meter Walk Test- trial 2: 9.66 seconds 5 Meter Walk Test- trial 3: 10.41 seconds 5 Meter Walk Test Average: 9.88 seconds   ___________________   STS (ISOLATED AVR): 3.71%,

## 2022-12-31 ENCOUNTER — Ambulatory Visit (HOSPITAL_COMMUNITY)
Admission: RE | Admit: 2022-12-31 | Discharge: 2022-12-31 | Disposition: A | Discharge status: Home / Self Care | Payer: PPO | Source: Ambulatory Visit

## 2022-12-31 DIAGNOSIS — Z48812 Encounter for surgical aftercare following surgery on the circulatory system: Secondary | ICD-10-CM | POA: Diagnosis not present

## 2022-12-31 DIAGNOSIS — Z01818 Encounter for other preprocedural examination: Secondary | ICD-10-CM | POA: Diagnosis not present

## 2022-12-31 DIAGNOSIS — I350 Nonrheumatic aortic (valve) stenosis: Secondary | ICD-10-CM | POA: Diagnosis not present

## 2022-12-31 DIAGNOSIS — I517 Cardiomegaly: Secondary | ICD-10-CM | POA: Diagnosis not present

## 2022-12-31 DIAGNOSIS — Z9049 Acquired absence of other specified parts of digestive tract: Secondary | ICD-10-CM | POA: Diagnosis not present

## 2022-12-31 MED ORDER — IOHEXOL 350 MG/ML SOLN
95.0000 mL | Freq: Once | INTRAVENOUS | Status: AC | PRN
  Administered 2022-12-31: 95 mL via INTRAVENOUS

## 2023-01-04 ENCOUNTER — Telehealth (HOSPITAL_COMMUNITY): Payer: Self-pay | Admitting: Vascular Surgery

## 2023-01-04 NOTE — Telephone Encounter (Signed)
LVM to make new chf appt

## 2023-01-06 ENCOUNTER — Telehealth: Payer: Self-pay

## 2023-01-06 NOTE — Telephone Encounter (Signed)
-----   Message from Cline Crock sent at 01/05/2023  2:36 PM EDT ----- Regarding: RE: pulmonary edema on TAVR CT Her heart cath last month showed elevated filling pressures as well. We could go up to torsemide 60mg  BID (up from 40mg  BID as reported). Doesn't look like she is on potassium and K has been good. Could make that change and get repeat labs. Looks like someone called her yesterday from CHF clinic to make an apt. ----- Message ----- From: Iona Coach, RN Sent: 01/05/2023   2:25 PM EDT To: Janetta Hora, PA-C Subject: pulmonary edema on TAVR CT                     Hey,   Reviewing her TAVR scan from 8/15.     Patchy ground-glass opacities small bilateral pleural effusions, likely due to pulmonary edema. 2. More focal consolidation is seen in the superior portion of the right lower lobe, could also be due to pulmonary edema, although underlying pulmonary nodule can not be excluded. Recommend follow-up chest CT 3 months to ensure resolution.   Should we advise her to take additional diuretic?  Leotis Shames

## 2023-01-06 NOTE — Telephone Encounter (Signed)
I spoke with the patient in regards to pulmonary edema noted on CT.  She states that last week she was really SOB and had swelling in her ankles (worse than baseline).  The pt resumed her medications after her TAVR CT and her breathing and swelling started to improve this past Monday. The patient is back to her baseline and feeling better today.  I had the pt verify how she is currently taking Torsemide and she states that she takes 20mg  every morning and 20mg  at lunch. I will update the pt's medication list with these instructions.  I advised the pt to contact the office if she has any additional symptoms of fluid overload so that we can advise on medications. Pt agreed with plan.

## 2023-01-19 ENCOUNTER — Other Ambulatory Visit: Payer: Self-pay

## 2023-01-19 ENCOUNTER — Encounter: Payer: Self-pay | Admitting: Cardiology

## 2023-01-19 ENCOUNTER — Ambulatory Visit: Payer: PPO | Attending: Cardiology | Admitting: Cardiology

## 2023-01-19 VITALS — BP 120/66 | HR 66 | Ht 60.0 in | Wt 208.8 lb

## 2023-01-19 DIAGNOSIS — E088 Diabetes mellitus due to underlying condition with unspecified complications: Secondary | ICD-10-CM

## 2023-01-19 DIAGNOSIS — I350 Nonrheumatic aortic (valve) stenosis: Secondary | ICD-10-CM

## 2023-01-19 DIAGNOSIS — I480 Paroxysmal atrial fibrillation: Secondary | ICD-10-CM | POA: Diagnosis not present

## 2023-01-19 DIAGNOSIS — Z7984 Long term (current) use of oral hypoglycemic drugs: Secondary | ICD-10-CM | POA: Diagnosis not present

## 2023-01-19 NOTE — Patient Instructions (Signed)
Medication Instructions:  Your physician recommends that you continue on your current medications as directed. Please refer to the Current Medication list given to you today.  *If you need a refill on your cardiac medications before your next appointment, please call your pharmacy*   Lab Work: None ordered If you have labs (blood work) drawn today and your tests are completely normal, you will receive your results only by: MyChart Message (if you have MyChart) OR A paper copy in the mail If you have any lab test that is abnormal or we need to change your treatment, we will call you to review the results.   Testing/Procedures: None ordered   Follow-Up: At [facility], you and your health needs are our priority.  As part of our continuing mission to provide you with exceptional heart care, we have created designated Provider Care Teams.  These Care Teams include your primary Cardiologist (physician) and Advanced Practice Providers (APPs -  Physician Assistants and Nurse Practitioners) who all work together to provide you with the care you need, when you need it.  We recommend signing up for the patient portal called "MyChart".  Sign up information is provided on this After Visit Summary.  MyChart is used to connect with patients for Virtual Visits (Telemedicine).  Patients are able to view lab/test results, encounter notes, upcoming appointments, etc.  Non-urgent messages can be sent to your provider as well.   To learn more about what you can do with MyChart, go to [email or web address].    Your next appointment:   2 month(s)  The format for your next appointment:   In Person  Provider:   Rajan Revankar, MD    Other Instructions none  Important Information About Sugar      

## 2023-01-19 NOTE — Progress Notes (Signed)
Cardiology Office Note:    Date:  01/19/2023   ID:  Crystal Ramos, DOB [birth year], MRN [id number]  PCP:  Paulina Fusi, MD  Cardiologist:  Garwin Brothers, MD   Referring MD: Paulina Fusi, MD    ASSESSMENT:    1. Paroxysmal atrial fibrillation (HCC)   2. Severe aortic stenosis   3. Diabetes mellitus due to underlying condition with unspecified complications (HCC)   4. Morbid obesity (HCC)    PLAN:    In order of problems listed above:  Primary prevention stressed with the patient.  Importance of compliance with diet medication stressed and patient verbalized standing. Paroxysmal atrial fibrillation:I discussed with the patient atrial fibrillation, disease process. Management and therapy including rate and rhythm control, anticoagulation benefits and potential risks were discussed extensively with the patient. Patient had multiple questions which were answered to patient's satisfaction. Essential hypertension: Blood pressure stable and diet was emphasized. Aortic stenosis: I reviewed coronary angiography and other evaluation and discussed with patient at length.  She is seeing cardiovascular and thoracic surgery tomorrow and is planning to get the TAVR done this month.  She is happy about it.  She will be seen in follow-up appointment after the procedure.  Patient had multiple questions which were answered to her satisfaction.   Medication Adjustments/Labs and Tests Ordered: Current medicines are reviewed at length with the patient today.  Concerns regarding medicines are outlined above.  Orders Placed This Encounter  Procedures   EKG 12-Lead   No orders of the defined types were placed in this encounter.    No chief complaint on file.    History of Present Illness:    Crystal Ramos is a 67 y.o. female.  Patient has past medical history of paroxysmal atrial fibrillation, essential hypertension, mixed dyslipidemia, diabetes mellitus and morbid  obesity.  She underwent evaluation for this and is scheduled to see the cardiovascular and thoracic surgeon tomorrow.  She has an appointment for TAVR procedure.  She denies any chest pain orthopnea or PND however she does give history of dyspnea on exertion.  At the time of my evaluation, the patient is alert awake oriented and in no distress.  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   Dyslipidemia 02/25/2017   Dyspnea on exertion 12/18/2015   Epistaxis 06/26/2021   Essential hypertension    Hematuria 11/29/2017   Morbid obesity (HCC) 02/25/2017   Osteopenia after menopause 12/01/2019   Paroxysmal atrial fibrillation (HCC) 02/25/2017   Psoriasis 07/31/2020   Severe aortic stenosis 12/02/2022   Type 2 diabetes mellitus without complication (HCC) 12/18/2015    Past Surgical History:  Procedure Laterality Date   CHOLECYSTECTOMY     LEFT HEART CATH AND CORONARY ANGIOGRAPHY N/A 04/03/2019   Procedure: LEFT HEART CATH AND CORONARY ANGIOGRAPHY;  Surgeon: Kathleene Hazel, MD;  Location: [facility];  Service: Cardiovascular;  Laterality: N/A;   MEDIAL  PARTIAL KNEE REPLACEMENT     RIGHT/LEFT HEART CATH AND CORONARY ANGIOGRAPHY N/A 12/07/2022   Procedure: RIGHT/LEFT HEART CATH AND CORONARY ANGIOGRAPHY;  Surgeon: Yvonne Kendall, MD;  Location: [facility];  Service: Cardiovascular;   Laterality: N/A;    Current Medications: Current Meds  Medication Sig   acetaminophen (TYLENOL) 500 MG tablet Take 1,000 mg by mouth every 6 (six) hours as needed for moderate pain.   ALPRAZolam (XANAX) 0.25 MG tablet Take 0.25 mg by mouth at bedtime.    amiodarone (PACERONE) 200 MG tablet Take 0.5 tablets (100 mg total) by mouth 2 (two) times daily.   ezetimibe (ZETIA) 10 MG tablet Take 10 mg by mouth daily.   JARDIANCE 25 MG TABS tablet Take 25 mg by mouth daily.   levothyroxine (SYNTHROID) 88 MCG tablet Take 88 mcg by mouth daily before breakfast.   metFORMIN (GLUCOPHAGE) 850 MG tablet Take 850 mg by mouth 3 (three) times daily.   metoprolol succinate (TOPROL-XL) 25 MG 24 hr tablet Take 1 tablet (25 mg total) by mouth 2 (two) times daily.   nitroGLYCERIN (NITROSTAT) 0.4 MG SL tablet Place 0.4 mg under the tongue every 5 (five) minutes as needed for chest pain.   ofloxacin (OCUFLOX) 0.3 % ophthalmic solution Place 1 drop into the left eye daily at 6 (six) AM.   prednisoLONE acetate (PRED FORTE) 1 % ophthalmic suspension Place 1 drop into the right eye 2 (two) times daily.   raloxifene (EVISTA) 60 MG tablet Take 1 tablet by mouth once daily   rivaroxaban (XARELTO) 20 MG TABS tablet Take 20 mg by mouth daily with supper.   torsemide (DEMADEX) 20 MG tablet Take 20 mg by mouth 2 (two) times daily.   traMADol (ULTRAM) 50 MG tablet Take 50 mg by mouth at bedtime.     Allergies:   Patient has no known allergies.   Social History   Socioeconomic History   Marital status: Married    Spouse name: Not on file   Number of children: Not on file   Years of education: Not on file   Highest education level: Not on file  Occupational History   Not on file  Tobacco Use   Smoking status: Never    Passive exposure: Yes   Smokeless tobacco: Never   Tobacco comments:    "whole family" smoked in the house  Vaping Use   Vaping status: Never Used  Substance and Sexual Activity   Alcohol use: No    Drug use: No   Sexual activity: Not on file  Other Topics Concern   Not on file  Social History Narrative   Not on file   Social Determinants of Health   Financial Resource Strain: Not on file  Food Insecurity: Not on file  Transportation Needs: Not on file  Physical Activity: Not on file  Stress: Not on file  Social Connections: Not on file     Family History: The patient's family history includes Breast cancer in her paternal aunt; Diabetes in her mother; Stroke in her father and paternal aunt.  ROS:   Please see the history of present illness.    All other systems reviewed and are negative.  EKGs/Labs/Other Studies Reviewed:    The following studies were reviewed today: .Marland KitchenEKG Interpretation Date/Time:  Tuesday January 19 2023 09:26:11 EDT Ventricular Rate:  66 PR Interval:  272 QRS Duration:  82 QT Interval:  604 QTC Calculation: 633 R Axis:   121  Text Interpretation: Sinus  rhythm with 1st degree A-V block with Premature supraventricular complexes Right axis deviation Low voltage QRS Cannot rule out Anterior infarct (cited on or before 20-Nov-2022) Prolonged QT Abnormal ECG When compared with ECG of 02-Dec-2022 14:52, Premature supraventricular complexes are now Present QRS axis Shifted right QT has lengthened Confirmed by Belva Crome [phone]) on 01/19/2023 9:50:23 AM    Recent Labs: 07/22/2022: ALT 17 11/20/2022: NT-Pro BNP 280 12/02/2022: BUN 17; Creatinine, Ser 0.99; Platelets 149 12/07/2022: Hemoglobin 11.6; Potassium 4.4; Sodium 141  Recent Lipid Panel    Component Value Date/Time   CHOL 164 01/12/2022 0904   TRIG 69 01/12/2022 0904   HDL 56 01/12/2022 0904   CHOLHDL 2.9 01/12/2022 0904   LDLCALC 95 01/12/2022 0904    Physical Exam:    VS:  BP 120/66   Pulse 66   Ht 5' (1.524 m)   Wt 208 lb 12.8 oz (94.7 kg)   SpO2 99%   BMI 40.78 kg/m     Wt Readings from Last 3 Encounters:  01/19/23 208 lb 12.8 oz (94.7 kg)  12/23/22 210 lb (95.3 kg)   12/07/22 210 lb (95.3 kg)     GEN: Patient is in no acute distress HEENT: Normal NECK: No JVD; No carotid bruits LYMPHATICS: No lymphadenopathy CARDIAC: Hear sounds regular, 2/6 systolic murmur at the apex. RESPIRATORY:  Clear to auscultation without rales, wheezing or rhonchi  ABDOMEN: Soft, non-tender, non-distended MUSCULOSKELETAL:  No edema; No deformity  SKIN: Warm and dry NEUROLOGIC:  Alert and oriented x 3 PSYCHIATRIC:  Normal affect   Signed, Garwin Brothers, MD  01/19/2023 9:51 AM    [facility]

## 2023-01-20 ENCOUNTER — Encounter: Payer: Self-pay | Admitting: Surgery

## 2023-01-20 ENCOUNTER — Institutional Professional Consult (permissible substitution): Payer: PPO | Admitting: Surgery

## 2023-01-20 VITALS — BP 143/78 | HR 68 | Resp 18 | Ht 60.0 in | Wt 206.0 lb

## 2023-01-20 DIAGNOSIS — I350 Nonrheumatic aortic (valve) stenosis: Secondary | ICD-10-CM | POA: Diagnosis not present

## 2023-01-20 NOTE — Progress Notes (Signed)
Patient ID: Crystal Ramos, female   DOB: 11/26/1955, 67 y.o.   MRN: [id number]   [facility]       [address]       [place] [address]             [phone]          CARDIOTHORACIC SURGERY CONSULTATION REPORT  PCP is Paulina Fusi, MD Referring Provider is Alverda Skeans, MD Primary Cardiologist is Belva Crome, MD  Reason for consultation: Severe aortic stenosis  HPI:  The patient is a 67 year old woman with a history of paroxysmal atrial fibrillation on Xarelto, type 2 diabetes, hypertension, morbid obesity with a BMI of 40, and severe aortic stenosis that has been followed with echocardiogram.  She had an echocardiogram in April 2023 showing a mean gradient across aortic valve of 14 mmHg with a valve area by VTI of 1.10 cm consistent with moderate aortic stenosis. Echo on 11/26/22 showed a mean gradient of 41 mm Hg, AVA of 0.48, and DI 0.17. LV systolic function is normal.  She reports that over the last several months she has had progressive shortness of breath and fatigue with exertion.  She has had no symptoms at rest but it does not take much activity to get shortness of breath.  She has had episodes of dizziness but no syncope.  She said that she has fallen a couple times related to dizziness.  She sleeps on 2 pillows but has done that for a long time.  She has had bilateral lower extremity edema that has been chronic and not changed much with diuretic.  She is here today by herself.  She lives with her husband at home and has 2 daughters.  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   Dyslipidemia 02/25/2017   Dyspnea on exertion 12/18/2015   Epistaxis 06/26/2021   Essential hypertension    Hematuria 11/29/2017   Morbid obesity (HCC) 02/25/2017   Osteopenia after menopause 12/01/2019   Paroxysmal atrial fibrillation (HCC) 02/25/2017   Psoriasis 07/31/2020   Severe aortic stenosis 12/02/2022   Type 2 diabetes mellitus without complication (HCC) 12/18/2015    Past Surgical History:  Procedure Laterality Date   CHOLECYSTECTOMY     LEFT HEART CATH AND CORONARY ANGIOGRAPHY N/A 04/03/2019   Procedure: LEFT HEART CATH AND CORONARY ANGIOGRAPHY;  Surgeon: Kathleene Hazel, MD;  Location: [facility];  Service: Cardiovascular;  Laterality: N/A;   MEDIAL PARTIAL KNEE REPLACEMENT     RIGHT/LEFT HEART CATH AND CORONARY ANGIOGRAPHY N/A 12/07/2022   Procedure: RIGHT/LEFT HEART CATH AND CORONARY ANGIOGRAPHY;  Surgeon: Yvonne Kendall, MD;  Location: [facility];  Service: Cardiovascular;  Laterality: N/A;    Family History  Problem Relation Age of Onset   Diabetes Mother    Stroke Father    Stroke Paternal Aunt  Breast cancer Paternal Aunt     Social History   Socioeconomic History   Marital status: Married    Spouse name: Not on file   Number of children: Not on file   Years of education: Not on file   Highest education level: Not on file  Occupational History   Not on file  Tobacco Use   Smoking status: Never    Passive exposure: Yes   Smokeless tobacco: Never   Tobacco comments:    "whole family" smoked in the house  Vaping Use   Vaping status: Never Used  Substance and Sexual Activity   Alcohol use: No   Drug use: No   Sexual activity: Not on  file  Other Topics Concern   Not on file  Social History Narrative   Not on file   Social Determinants of Health   Financial Resource Strain: Not on file  Food Insecurity: Not on file  Transportation Needs: Not on file  Physical Activity: Not on file  Stress: Not on file  Social Connections: Not on file  Intimate Partner Violence: Not on file    Prior to Admission medications   Medication Sig Start Date End Date Taking? Authorizing Provider  ALPRAZolam (XANAX) 0.25 MG tablet Take 0.25 mg by mouth at bedtime.  07/26/18  Yes [provider]  amiodarone (PACERONE) 200 MG tablet Take 0.5 tablets (100 mg total) by mouth 2 (two) times daily. 06/29/22  Yes Revankar, Aundra Dubin, MD  ezetimibe (ZETIA) 10 MG tablet Take 10 mg by mouth daily.   Yes [provider]  JARDIANCE 25 MG TABS tablet Take 25 mg by mouth daily. 01/29/20  Yes [provider]  levothyroxine (SYNTHROID) 88 MCG tablet Take 88 mcg by mouth daily before breakfast.   Yes [provider]  metFORMIN (GLUCOPHAGE) 850 MG tablet Take 850 mg by mouth 3 (three) times daily.   Yes [provider]  metoprolol succinate (TOPROL-XL) 25 MG 24 hr tablet Take 1 tablet (25 mg total) by mouth 2 (two) times daily. 07/22/22  Yes Revankar, Aundra Dubin, MD  nitroGLYCERIN (NITROSTAT) 0.4 MG SL tablet Place 0.4 mg under the tongue every 5 (five) minutes as needed for chest pain.   Yes [provider]  prednisoLONE acetate (PRED FORTE) 1 % ophthalmic suspension Place 1 drop into the left eye 4 (four) times daily.   Yes [provider]  raloxifene (EVISTA) 60 MG tablet Take 1 tablet by mouth once daily 10/23/22  Yes [name], MD  rivaroxaban (XARELTO) 20 MG TABS tablet Take 20 mg by mouth daily with supper.   Yes [provider]  torsemide (DEMADEX) 20 MG tablet Take 20 mg by mouth 2 (two) times daily.   Yes [provider]  traMADol (ULTRAM) 50 MG tablet Take 50 mg by mouth  2 (two) times daily as needed for moderate pain. 01/11/20  Yes [provider]    Current Outpatient Medications  Medication Sig Dispense Refill   ALPRAZolam (XANAX) 0.25 MG tablet Take 0.25 mg by mouth at bedtime.      amiodarone (PACERONE) 200 MG tablet Take 0.5 tablets (100 mg total) by mouth 2 (two) times daily. 90 tablet 2   ezetimibe (ZETIA) 10 MG tablet Take 10 mg by mouth daily.     JARDIANCE 25 MG TABS tablet Take 25 mg by mouth daily.     levothyroxine (SYNTHROID) 88 MCG tablet Take 88 mcg by mouth daily before breakfast.  metFORMIN (GLUCOPHAGE) 850 MG tablet Take 850 mg by mouth 3 (three) times daily.     metoprolol succinate (TOPROL-XL) 25 MG 24 hr tablet Take 1 tablet (25 mg total) by mouth 2 (two) times daily. 180 tablet 3   nitroGLYCERIN (NITROSTAT) 0.4 MG SL tablet Place 0.4 mg under the tongue every 5 (five) minutes as needed for chest pain.     prednisoLONE acetate (PRED FORTE) 1 % ophthalmic suspension Place 1 drop into the left eye 4 (four) times daily.     raloxifene (EVISTA) 60 MG tablet Take 1 tablet by mouth once daily 30 tablet 5   rivaroxaban (XARELTO) 20 MG TABS tablet Take 20 mg by mouth daily with supper.     torsemide (DEMADEX) 20 MG tablet Take 20 mg by mouth 2 (two) times daily.     traMADol (ULTRAM) 50 MG tablet Take 50 mg by mouth 2 (two) times daily as needed for moderate pain.     No current facility-administered medications for this visit.    No Known Allergies    Review of Systems:   General:  + loss of appetite, + decreased energy, no weight gain, no weight loss, no fever  Cardiac:  no chest pain with exertion, no chest pain at rest, +SOB with mild exertion, no resting SOB, no PND, no orthopnea, no palpitations, + arrhythmia, + atrial fibrillation, + LE edema, + dizzy spells, no syncope  Respiratory:  + exertional shortness of breath, no home oxygen, no productive cough, no dry cough, no bronchitis, no wheezing, no hemoptysis, no asthma,  no pain with inspiration or cough, no sleep apnea, no CPAP at night  GI:   no difficulty swallowing, no reflux, no frequent heartburn, no hiatal hernia, no abdominal pain, no constipation, no diarrhea, no hematochezia, no hematemesis, no melena  GU:   no dysuria,  no frequency, no urinary tract infection, no hematuria, no kidney stones, no kidney disease  Vascular:  no pain suggestive of claudication, no pain in feet, no leg cramps, no varicose veins, no DVT, no non-healing foot ulcer  Neuro:   no stroke, no TIA's, no seizures, no headaches, no temporary blindness one eye,  no slurred speech, no peripheral neuropathy, no chronic pain, no instability of gait, no memory/cognitive dysfunction  Musculoskeletal: no arthritis, no joint swelling, + myalgias, + difficulty walking, + decreased mobility   Skin:   no rash, no itching, no skin infections, no pressure sores or ulcerations  Psych:   no anxiety, no depression, no nervousness, no unusual recent stress  Eyes:   no blurry vision, no floaters, + recent vision changes, no  glasses or contacts  ENT:   no hearing loss, no loose or painful teeth, + dentures  Hematologic:  no easy bruising, no abnormal bleeding, no clotting disorder, no frequent epistaxis  Endocrine:  + diabetes, does check CBG's at home     Physical Exam:   BP (!) 143/78 (BP Location: Left Arm, Patient Position: Sitting)   Pulse 68   Resp 18   Ht 5' (1.524 m)   Wt 206 lb (93.4 kg)   SpO2 94% Comment: RA  BMI 40.23 kg/m   General:  well-appearing  HEENT:  Unremarkable, NCAT, PERLA, EOMI  Neck:   no JVD, no bruits, no adenopathy or thyromegaly  Chest:   clear to auscultation, symmetrical breath sounds, no wheezes, no rhonchi   CV:   RRR, 3/6 systolic murmur RSB, no diastolic murmur  Abdomen:  soft, non-tender, no masses  Extremities:  warm, well-perfused, pedal pulses not palpable , mild bilateral lower extremity edema  Rectal/GU  Deferred  Neuro:   Grossly non-focal and  symmetrical throughout  Skin:   Clean and dry, no rashes, no breakdown  Diagnostic Tests:  Physicians  Panel Physicians Referring Physician Case Authorizing Physician  End, Cristal Deer, MD (Primary)     Procedures  RIGHT/LEFT HEART CATH AND CORONARY ANGIOGRAPHY   Conclusion  Conclusions: Mild to moderate, non-obstructive coronary artery disease with up to 50% stenosis of the mid LAD. Mildly to moderately elevated left heart filling pressures (LVEDP 20 mmHg, PCWP 32 mm).  Prominent v-waves on PCWP tracing and discrepancy in pressures between PCWP and LVEDP suggest underling mitral valve disease. Severely elevated right heart and pulmonary artery pressures (RA 17 mmHg, mean PA 48 mmHg). Normal Fick cardiac output/index. Moderate to severe aortic valve stenosis (mean gradient 36 mmHg, valve area 0.8 cm^2).   Recommendations: Escalate diuresis. Restart rivaroxaban tomorrow if no evidence of bleeding/vascular injury at catheterization sites. Consultation with structural heart and advanced heart failure teams should be considered.   Yvonne Kendall, MD [facility]     Recommendations  Antiplatelet/Anticoag Recommend to resume Rivaroxaban, at currently prescribed dose and frequency on 12/08/2022. Concurrent antiplatelet therapy not recommended.  Discharge Date In the absence of any other complications or medical issues, we expect the patient to be ready for discharge from a cath perspective on 12/07/2022.   Surgeon Notes    12/07/2022  8:57 AM Brief Op Note signed by Yvonne Kendall, MD   Indications  Severe aortic stenosis [I35.0 (ICD-10-CM)]  Chronic heart failure with preserved ejection fraction (HCC) [I50.32 (ICD-10-CM)]   Clinical Presentation  CHF/Shock Congestive heart failure present. NYHA Class III. No shock present.   Procedural Details  Technical Details Indication: 67 y.o. year-old woman with history of aortic stenosis (reportedly severe on echo earlier this  month at [facility]), chronic HFpEF, PAD, paroxysmal atrial fibrillation, hypertension, hyperlipidemia, and type 2 diabetes mellitus, presenting for evaluation of progressive shortness of breath and edema.  GFR: >60 ml/min  Procedure: The risks, benefits, complications, treatment options, and expected outcomes were discussed with the patient. The patient and/or family concurred with the proposed plan, giving informed consent. The right wrist and elbow were prepped and draped in a sterile fashion. 1% lidocaine was used for local anesthesia. A previously placed antecubital vein IV was exchanged for a 82F slender Glidesheath using modified Seldinger technique. Right heart catheterization was performed by advancing a 82F balloon-tipped catheter through the right heart chambers into the pulmonary capillary wedge position. Pressure measurements and oxygen saturations were obtained.  Using the modified Seldinger access technique, a 71F slender Glidesheath was placed in the right radial artery. 3 mg Verapamil was given through the sheath. Heparin 4,500 units were administered. Selective coronary angiography was performed using a 82F JL3.5 catheter to engage the left coronary artery and a 82F JR4 catheter to engage the right coronary artery. Left heart catheterization was performed using a 82F JR4 catheter and a straight wire. Left ventriculogram was not performed. Aortic valve gradient was calculated using the pullback method, as a dual-lumen catheter was not available.  At the end of the procedure, the radial artery sheath was removed and a TR band applied to achieve patent hemostasis. The antecubital vein sheath was removed and hemostasis achieved with manual compression. There were no immediate complications. The patient was taken to the recovery area in stable condition.   Estimated blood loss <50 mL.  During this procedure medications were administered to achieve and maintain moderate conscious sedation  while the patient's heart rate, blood pressure, and oxygen saturation were continuously monitored and I was present face-to-face 100% of this time.   Medications (Filter: Administrations occurring from 0727 to 0840 on 12/07/22) midazolam (VERSED) injection (mg)  Total dose: 1 mg Date/Time Rate/Dose/Volume Action   12/07/22 0743 1 mg Given   fentaNYL (SUBLIMAZE) injection (mcg)  Total dose: 25 mcg Date/Time Rate/Dose/Volume Action   12/07/22 0743 25 mcg Given   lidocaine (PF) (XYLOCAINE) 1 % injection (mL)  Total volume: 6 mL Date/Time Rate/Dose/Volume Action   12/07/22 0748 2 mL Given   0749 4 mL Given   Heparin (Porcine) in NaCl 1000-0.9 UT/500ML-% SOLN (mL)  Total volume: 1,500 mL Date/Time Rate/Dose/Volume Action   12/07/22 0753 500 mL Given   0753 500 mL Given   0754 500 mL Given   Radial Cocktail/Verapamil only (mL)  Total volume: 10 mL Date/Time Rate/Dose/Volume Action   12/07/22 0757 10 mL Given   heparin sodium (porcine) injection (Units)  Total dose: 4,500 Units Date/Time Rate/Dose/Volume Action   12/07/22 0758 4,500 Units Given   iohexol (OMNIPAQUE) 350 MG/ML injection (mL)  Total volume: 39 mL Date/Time Rate/Dose/Volume Action   12/07/22 0816 39 mL Given    Sedation Time  Sedation Time Physician-1: 56 minutes 14 seconds Contrast     Administrations occurring from 0727 to 0840 on 12/07/22:  Medication Name Total Dose  iohexol (OMNIPAQUE) 350 MG/ML injection 39 mL   Radiation/Fluoro  Fluoro time: 6.2 (min) DAP: 21.5 (Gycm2) Cumulative Air Kerma: 457.2 (mGy) Complications  Complications documented before study signed (12/07/2022  4:10 PM)   No complications were associated with this study.  Documented by Argie Ramming, RN - 12/07/2022  8:17 AM     Coronary Findings  Diagnostic Dominance: Right Left Main  Vessel is large. Vessel is angiographically normal.    Left Anterior Descending  Vessel is large.  Mid LAD lesion is 50% stenosed. The  lesion is irregular. The lesion is mildly calcified.    First Diagonal Branch  Vessel is moderate in size.    Ramus Intermedius  Vessel is moderate in size.  Ramus lesion is 30% stenosed.    Left Circumflex  Vessel is large.  Ost Cx to Prox Cx lesion is 30% stenosed.    First Obtuse Marginal Branch  Vessel is small in size.    Second Obtuse Marginal Branch  Vessel is moderate in size.    Third Obtuse Marginal Branch  Vessel is moderate in size.    Right Coronary Artery  Vessel is large.  Ost RCA lesion is 30% stenosed.    Right Ventricular Branch  RV Branch lesion is 95% stenosed.    Intervention   No interventions have been documented.   Right Heart  Right Heart Pressures RA (mean): 17 mmHg RV (S/EDP): 82/18 mmHg PA (S/D, mean): 79/32 (48) mmHg PCWP (mean): 32 mmHg with prominent v-waves  Ao sat: 94% PA sat: 67%  Fick CO: 5.8 L/min Fick CI: 3.0 L/min/m^2  PVR: 2.8 Wood units   Left Heart  Left Ventricle LV end diastolic pressure is mildly elevated. LVEDP 20 mmHg.  Aortic Valve Moderate to severe aortic valve stenosis (mean gradient 36 mmHg, valve area 0.8 cm^2).   Coronary Diagrams  Diagnostic Dominance: Right  Intervention   Implants   No implant documentation for this case.   Syngo Images   Show images for CARDIAC CATHETERIZATION Images  on Long Term Storage   Show images for Nikhila, Stoner Cox Link to Procedure Log  Procedure Log   Link to Procedure Log  Procedure Log    Hemo Data  Flowsheet Row Most Recent Value  Fick Cardiac Output 5.81 L/min  Fick Cardiac Output Index 3.02 (L/min)/BSA  Aortic Mean Gradient 35.91 mmHg  Aortic Peak Gradient 40.1 mmHg  Aortic Valve Area 0.78  Aortic Value Area Index 0.41 cm2/BSA  RA A Wave 15 mmHg  RA V Wave 15 mmHg  RA Mean 10 mmHg  RV Systolic Pressure 75 mmHg  RV Diastolic Pressure 7 mmHg  RV EDP 15 mmHg  PA Systolic Pressure 65 mmHg  PA Diastolic Pressure 21 mmHg  PA Mean 44 mmHg   PW A Wave 29 mmHg  PW V Wave 41 mmHg  PW Mean 26 mmHg  AO Systolic Pressure 131 mmHg  AO Diastolic Pressure 60 mmHg  AO Mean 88 mmHg  LV Systolic Pressure 166 mmHg  LV Diastolic Pressure -4 mmHg  LV EDP 9 mmHg  AOp Systolic Pressure 125 mmHg  AOp Diastolic Pressure 59 mmHg  AOp Mean Pressure 86 mmHg  LVp Systolic Pressure 163 mmHg  LVp Diastolic Pressure -2 mmHg  LVp EDP Pressure 9 mmHg  QP/QS 1  TPVR Index 14.58 HRUI  TSVR Index 29.16 HRUI  PVR SVR Ratio 0.23  TPVR/TSVR Ratio 0.5   ADDENDUM REPORT: 12/31/2022 11:27   CLINICAL DATA:  Aortic stenosis   EXAM: Cardiac TAVR CT   TECHNIQUE: The patient was scanned on a Siemens Force 192 slice scanner. A 120 kV retrospective scan was triggered in the descending thoracic aorta at 111 HU's. Gantry rotation speed was 270 msecs and collimation was .9 mm. No beta blockade or nitro were given. The 3D data set was reconstructed in 5% intervals of the R-R cycle. Systolic and diastolic phases were analyzed on a dedicated work station using MPR, MIP and VRT modes. The patient received 80 cc of contrast.   FINDINGS: Aortic Valve: Tri leaflet calcified with restricted leaflet motion Calcium score 2150   Aorta: No aneurysm mild calcific atherosclerosis   Sinotubular Junction: 29 mm   Ascending Thoracic Aorta: 35 mm   Aortic Arch: Bovine arch 24 mm   Descending Thoracic Aorta: 23 mm   Sinus of Valsalva Measurements:   Non-coronary: 31.2 mm   Height 21.8 mm   Right - coronary: 30.5   mm  height 24.4 mm   Left - coronary: 30.9 mm   Height 23.5 mm   Coronary Artery Height above Annulus:   Left Main: 12.7 mm above annulus   Right Coronary: 18.2 mm above annulus   Virtual Basal Annulus Measurements:   Maximum/Minimum Diameter: 25.2 mm x 21.4 mm Average diameter 23.8 mm   Perimeter: 76.1 mm   Area: 444 mm2   Coronary Arteries: Sufficient height above annulus for deployment   Optimum Fluoroscopic Angle for Delivery:  LAO 23 Caudal 10 degrees   Membranous septal length 7.2 mm   IMPRESSION: 1. Calcified tri leaflet AV with calcium score 2150   2. Annular area of 444 mm2 suitable for a 26 mm Sapien 3 valve Alternatively a 29 mm Medtronic Evolut can be considered   3.  Coronary arteries sufficient height above annulus for deployment   4. Optimum angiographic angle for deployment LAO 23 Caudal 10 degrees   5.  Membranous septal length 7.2 mm   Charlton Haws     Electronically Signed   By: Theron Arista  Eden Emms M.D.   On: 12/31/2022 11:27    Addended by Wendall Stade, MD on 12/31/2022 11:30 AM    Study Result  Narrative & Impression  EXAM: OVER-READ INTERPRETATION  CT CHEST   The following report is a limited chest CT over-read performed by radiologist Dr. Allegra Lai of [facility] on 12/31/2022. This over-read does not include interpretation of cardiac or coronary anatomy or pathology. The cardiac TAVR interpretation by the cardiologist is attached.   COMPARISON:  None Available.   FINDINGS: Extracardiac findings will be described separately under dictation for contemporaneously obtained CTA chest, abdomen and pelvis.   IMPRESSION: Please see separate dictation for contemporaneously obtained CTA chest, abdomen and pelvis dated 12/31/2022 for full description of relevant extracardiac findings.   Electronically Signed: By: Allegra Lai M.D. On: 12/31/2022 10:02      Narrative & Impression  CLINICAL DATA:  Preop evaluation for aortic valve replacement   EXAM: CT ANGIOGRAPHY CHEST, ABDOMEN AND PELVIS   TECHNIQUE: Multidetector CT imaging through the chest, abdomen and pelvis was performed using the standard protocol during bolus administration of intravenous contrast. Multiplanar reconstructed images and MIPs were obtained and reviewed to evaluate the vascular anatomy.   RADIATION DOSE REDUCTION: This exam was performed according to the departmental  dose-optimization program which includes automated exposure control, adjustment of the mA and/or kV according to patient size and/or use of iterative reconstruction technique.   CONTRAST:  95mL OMNIPAQUE IOHEXOL 350 MG/ML SOLN   COMPARISON:  CT abdomen dated April 24, 2019; CT chest dated June 28, 2017   FINDINGS: CTA CHEST FINDINGS   Cardiovascular: Cardiomegaly. No pericardial effusion. Severe three-vessel coronary artery calcifications. Aortic valve calcifications. Normal caliber thoracic aorta with mild atherosclerotic disease.   Mediastinum/Nodes: Esophagus and thyroid are unremarkable. Mildly enlarged mediastinal and bilateral hilar lymph nodes, likely reactive reference right paratracheal lymph node measuring 10 mm on series 6, image 61.   Lungs/Pleura: Central airways are patent. New patchy ground-glass opacities with more and small bilateral pleural effusions. More focal area of consolidation is seen in the superior portion of the right upper lobe on series 7, image 33, also new when compared with 2019 prior.   Musculoskeletal: No chest wall abnormality. No acute or significant osseous findings.   Review of the MIP images confirms the above findings.   CTA ABDOMEN AND PELVIS FINDINGS   Hepatobiliary: Mildly nodular liver contour. No focal liver abnormality is seen. Status post cholecystectomy. No biliary dilatation.   Pancreas: Unremarkable. No pancreatic ductal dilatation or surrounding inflammatory changes.   Spleen: Normal in size without focal abnormality.   Adrenals/Urinary Tract: bilateral adrenal glands are unremarkable. No hydronephrosis or nephrolithiasis. Indeterminate lesion of the lower pole of the left kidney measuring 13 mm on series 10, image 72, new when compared with prior abdomen CT. Bladder is unremarkable.   Stomach/Bowel: Stomach is within normal limits. Appendix appears normal. Diverticulosis. No evidence of bowel wall  thickening, distention, or inflammatory changes.   Vascular/lymphatic: Normal caliber abdominal aorta with moderate atherosclerotic disease. Incidental note is made of large retroperitoneal portosystemic shunt.   Reproductive: Uterus and bilateral adnexa are unremarkable.   Other: No abdominal wall hernia or abnormality. No abdominopelvic ascites.   Musculoskeletal: Mild compression deformity of L3, likely chronic. No acute or significant osseous findings.   VASCULAR MEASUREMENTS PERTINENT TO TAVR:   AORTA:   Minimal Aortic Diameter-2.4 mm   Severity of Aortic Calcification-moderate   RIGHT PELVIS:   Right Common Iliac Artery -  Minimal Diameter-6.2 mm   Tortuosity-mild   Calcification-severe   Right External Iliac Artery -   Minimal Diameter-7.8 mm   Tortuosity-mild   Calcification-none   Right Common Femoral Artery -   Minimal Diameter-6.7 mm   Tortuosity-none   Calcification-mild   LEFT PELVIS:   Left Common Iliac Artery -   Minimal Diameter-6.3 mm   Tortuosity-none   Calcification-severe   Left External Iliac Artery -   Minimal Diameter-7.7 mm   Tortuosity-moderate   Calcification-none   Left Common Femoral Artery -   Minimal Diameter-6.4 mm   Tortuosity-none   Calcification-moderate   Review of the MIP images confirms the above findings.   IMPRESSION: Vascular:   1. Vascular findings and measurements pertinent to potential TAVR procedure, as detailed above. 2. Thickening and calcification of the aortic valve, compatible with reported clinical history of aortic stenosis. 3. Moderate to severe aortoiliac atherosclerosis. Three vessel coronary artery disease.   Nonvascular:   1. Patchy ground-glass opacities small bilateral pleural effusions, likely due to pulmonary edema. 2. More focal consolidation is seen in the superior portion of the right lower lobe, could also be due to pulmonary edema, although underlying  pulmonary nodule can not be excluded. Recommend follow-up chest CT 3 months to ensure resolution. 3. Cirrhotic liver morphology with sequela of portal hypertension including large retroperitoneal portosystemic shunt. 4. Indeterminate lesion of the lower pole of the left kidney measuring 13 mm, new when compared with prior abdomen CT. Recommend further evaluation with contrast-enhanced renal protocol MRI or CT.     Electronically Signed   By: Allegra Lai M.D.   On: 12/31/2022 10:37      Impression:  This 67 year old woman has stage D, severe, symptomatic aortic stenosis with NYHA class II symptoms of exertional fatigue and shortness of breath.  She has also been having increasingly frequent episodes of dizziness.  I have personally reviewed her 2D echocardiogram report from [facility], cardiac catheterization, and CTA studies.  Her echocardiogram reportedly shows a mean gradient of 41 mmHg across the aortic valve with a valve area of 0.48 cm and dimensionless index of 0.17 consistent with severe aortic stenosis.  Her cardiac catheterization shows mild to moderate nonobstructive coronary disease with a 50% mid LAD stenosis.  The mean gradient measured across the aortic valve was 36 mmHg with a valve area of 0.8 cm.  I agree that aortic valve replacement is indicated in this patient for relief of her symptoms and to prevent progressive left ventricular deterioration.  Given her age and comorbid risk factors with a BMI of 40 I think that transcatheter aortic valve replacement would be the best option for treating her.  Her gated cardiac CTA shows anatomy suitable for TAVR using a 29 mm Medtronic Evolute FX valve.  Her abdominal and pelvic CTA shows adequate pelvic vascular anatomy to allow transfemoral insertion.  The patient was counseled at length regarding treatment alternatives for management of severe symptomatic aortic stenosis. The risks and benefits of surgical intervention has  been discussed in detail. Long-term prognosis with medical therapy was discussed. Alternative approaches such as conventional surgical aortic valve replacement, transcatheter aortic valve replacement, and palliative medical therapy were compared and contrasted at length. This discussion was placed in the context of the patient's own specific clinical presentation and past medical history. All of her questions have been addressed.   Following the decision to proceed with transcatheter aortic valve replacement, a discussion was held regarding what types of management strategies would be  attempted intraoperatively in the event of life-threatening complications, including whether or not the patient would be considered a candidate for the use of cardiopulmonary bypass and/or conversion to open sternotomy for attempted surgical intervention.  I think she would be a candidate for emergent sternotomy to manage any intraoperative complications.  The patient is aware of the fact that transient use of cardiopulmonary bypass may be necessary. The patient has been advised of a variety of complications that might develop including but not limited to risks of death, stroke, paravalvular leak, aortic dissection or other major vascular complications, aortic annulus rupture, device embolization, cardiac rupture or perforation, mitral regurgitation, acute myocardial infarction, arrhythmia, heart block or bradycardia requiring permanent pacemaker placement, congestive heart failure, respiratory failure, renal failure, pneumonia, infection, other late complications related to structural valve deterioration or migration, or other complications that might ultimately cause a temporary or permanent loss of functional independence or other long term morbidity. The patient provides full informed consent for the procedure as described and all questions were answered.      Plan:  She will be scheduled for transfemoral TAVR using a  Medtronic valve on Tuesday, 01/26/2023.  I spent 60 minutes performing this consultation and > 50% of this time was spent face to face counseling and coordinating the care of this patient's severe symptomatic aortic stenosis.   Alleen Borne, MD 01/20/2023

## 2023-01-22 ENCOUNTER — Other Ambulatory Visit: Payer: Self-pay

## 2023-01-22 ENCOUNTER — Ambulatory Visit (HOSPITAL_COMMUNITY)
Admission: RE | Admit: 2023-01-22 | Discharge: 2023-01-22 | Disposition: A | Discharge status: Home / Self Care | Payer: PPO | Source: Ambulatory Visit | Attending: Internal Medicine | Admitting: Internal Medicine

## 2023-01-22 ENCOUNTER — Encounter (HOSPITAL_COMMUNITY)
Admission: RE | Admit: 2023-01-22 | Discharge: 2023-01-22 | Disposition: A | Discharge status: Home / Self Care | Payer: PPO | Source: Ambulatory Visit | Attending: Internal Medicine

## 2023-01-22 DIAGNOSIS — I350 Nonrheumatic aortic (valve) stenosis: Secondary | ICD-10-CM | POA: Diagnosis not present

## 2023-01-22 DIAGNOSIS — Z01818 Encounter for other preprocedural examination: Secondary | ICD-10-CM | POA: Insufficient documentation

## 2023-01-22 DIAGNOSIS — Z1152 Encounter for screening for COVID-19: Secondary | ICD-10-CM | POA: Insufficient documentation

## 2023-01-22 DIAGNOSIS — R0989 Other specified symptoms and signs involving the circulatory and respiratory systems: Secondary | ICD-10-CM | POA: Diagnosis not present

## 2023-01-22 DIAGNOSIS — I517 Cardiomegaly: Secondary | ICD-10-CM | POA: Diagnosis not present

## 2023-01-22 LAB — COMPREHENSIVE METABOLIC PANEL
ALT: 18 U/L (ref 0–44)
AST: 35 U/L (ref 15–41)
Albumin: 3.3 g/dL — ABNORMAL LOW (ref 3.5–5.0)
Alkaline Phosphatase: 120 U/L (ref 38–126)
Anion gap: 16 — ABNORMAL HIGH (ref 5–15)
BUN: 11 mg/dL (ref 8–23)
CO2: 24 mmol/L (ref 22–32)
Calcium: 9.4 mg/dL (ref 8.9–10.3)
Chloride: 101 mmol/L (ref 98–111)
Creatinine, Ser: 0.92 mg/dL (ref 0.44–1.00)
GFR, Estimated: >60 mL/min (ref >60)
Glucose, Bld: 188 mg/dL — ABNORMAL HIGH (ref 70–99)
Potassium: 3.8 mmol/L (ref 3.5–5.1)
Sodium: 141 mmol/L (ref 135–145)
Total Bilirubin: 0.9 mg/dL (ref 0.3–1.2)
Total Protein: 7.2 g/dL (ref 6.5–8.1)

## 2023-01-22 LAB — URINALYSIS, ROUTINE W REFLEX MICROSCOPIC
Bilirubin Urine: NEGATIVE
Glucose, UA: >=500 mg/dL — AB
Hgb urine dipstick: NEGATIVE
Ketones, ur: NEGATIVE mg/dL
Nitrite: NEGATIVE
Protein, ur: NEGATIVE mg/dL
Specific Gravity, Urine: 1.029 (ref 1.005–1.030)
pH: 6 (ref 5.0–8.0)

## 2023-01-22 LAB — CBC
HCT: 40.9 % (ref 36.0–46.0)
Hemoglobin: 11.9 g/dL — ABNORMAL LOW (ref 12.0–15.0)
MCH: 24.3 pg — ABNORMAL LOW (ref 26.0–34.0)
MCHC: 29.1 g/dL — ABNORMAL LOW (ref 30.0–36.0)
MCV: 83.5 fL (ref 80.0–100.0)
Platelets: 163 10*3/uL (ref 150–400)
RBC: 4.9 MIL/uL (ref 3.87–5.11)
RDW: 15.9 % — ABNORMAL HIGH (ref 11.5–15.5)
WBC: 5.4 10*3/uL (ref 4.0–10.5)
nRBC: 0 % (ref 0.0–0.2)

## 2023-01-22 LAB — PROTIME-INR
INR: 1.2 (ref 0.8–1.2)
Prothrombin Time: 15.7 s — ABNORMAL HIGH (ref 11.4–15.2)

## 2023-01-22 LAB — TYPE AND SCREEN
ABO/RH(D): O POS
Antibody Screen: NEGATIVE

## 2023-01-22 LAB — SURGICAL PCR SCREEN
MRSA, PCR: NEGATIVE
Staphylococcus aureus: NEGATIVE

## 2023-01-22 NOTE — Progress Notes (Signed)
Patient signed all consents at PAT lab appointment. CHG soap and instructions were given to patient. CHG surgical prep reviewed with patient and all questions answered.  Patients chart send to anesthesia for review.  

## 2023-01-23 LAB — SARS CORONAVIRUS 2 (TAT 6-24 HRS): SARS Coronavirus 2: NEGATIVE

## 2023-01-25 MED ORDER — HEPARIN 30,000 UNITS/1000 ML (OHS) CELLSAVER SOLUTION
Status: DC
  Filled 2023-01-25 (×2): qty 1000

## 2023-01-25 MED ORDER — NOREPINEPHRINE 4 MG/250ML-% IV SOLN
0.0000 ug/min | INTRAVENOUS | Status: DC
  Filled 2023-01-25: qty 250

## 2023-01-25 MED ORDER — DEXMEDETOMIDINE HCL IN NACL 400 MCG/100ML IV SOLN
0.1000 ug/kg/h | INTRAVENOUS | Status: DC
  Filled 2023-01-25: qty 100

## 2023-01-25 MED ORDER — POTASSIUM CHLORIDE 2 MEQ/ML IV SOLN
80.0000 meq | INTRAVENOUS | Status: DC
  Filled 2023-01-25 (×2): qty 40

## 2023-01-25 MED ORDER — MAGNESIUM SULFATE 50 % IJ SOLN
40.0000 meq | INTRAMUSCULAR | Status: DC
  Filled 2023-01-25 (×2): qty 9.85

## 2023-01-25 MED ORDER — CEFAZOLIN SODIUM-DEXTROSE 2-4 GM/100ML-% IV SOLN
2.0000 g | INTRAVENOUS | Status: AC
  Administered 2023-01-26: 2 g via INTRAVENOUS
  Filled 2023-01-25: qty 100

## 2023-01-25 NOTE — H&P (Signed)
[address]       [place] [address]             [phone]      Cardiothoracic Surgery Admission History and Physical   PCP is Paulina Fusi, MD Referring Provider is Alverda Skeans, MD Primary Cardiologist is Belva Crome, MD   Reason for admission: Severe aortic stenosis   HPI:   The patient is a 67 year old woman with a history of paroxysmal atrial fibrillation on Xarelto, type 2 diabetes, hypertension, morbid obesity with a BMI of 40, and severe aortic stenosis that has been followed with echocardiogram.  She had an echocardiogram in April 2023 showing a mean gradient across aortic valve of 14 mmHg with a valve area by VTI of 1.10 cm consistent with moderate aortic stenosis. Echo on 11/26/22 showed a mean gradient of 41 mm Hg, AVA of 0.48, and DI 0.17. LV systolic function is normal.  She reports that over the last several months she has had progressive shortness of breath and fatigue with exertion.  She has had no symptoms at rest but it does not take much activity to get shortness of breath.  She has had episodes of dizziness but no syncope.  She said that she has fallen a couple times related to dizziness.  She sleeps on 2 pillows but has done that for a long time.  She has had bilateral lower extremity edema that has been chronic and not changed much with diuretic.   She lives with her husband at home and has 2 daughters.       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   Dyslipidemia 02/25/2017   Dyspnea on exertion 12/18/2015   Epistaxis 06/26/2021   Essential hypertension     Hematuria 11/29/2017   Morbid obesity (HCC) 02/25/2017   Osteopenia after menopause 12/01/2019   Paroxysmal atrial fibrillation (HCC) 02/25/2017   Psoriasis 07/31/2020   Severe aortic stenosis 12/02/2022   Type 2 diabetes mellitus without complication (HCC) 12/18/2015               Past Surgical History:  Procedure Laterality Date   CHOLECYSTECTOMY       LEFT HEART CATH AND CORONARY ANGIOGRAPHY N/A 04/03/2019    Procedure: LEFT HEART CATH AND CORONARY ANGIOGRAPHY;  Surgeon: Kathleene Hazel, MD;  Location: [facility];  Service: Cardiovascular;  Laterality: N/A;   MEDIAL PARTIAL KNEE REPLACEMENT       RIGHT/LEFT HEART CATH AND CORONARY ANGIOGRAPHY N/A 12/07/2022    Procedure: RIGHT/LEFT HEART CATH AND CORONARY ANGIOGRAPHY;  Surgeon: Yvonne Kendall, MD;  Location: [facility];  Service: Cardiovascular;  Laterality: N/A;               Family History  Problem Relation Age of Onset   Diabetes Mother     Stroke Father  Stroke Paternal Aunt     Breast cancer Paternal Aunt            Social History         Socioeconomic History   Marital status: Married      Spouse name: Not on file   Number of children: Not on file   Years of education: Not on file   Highest education level: Not on file  Occupational History   Not on file  Tobacco Use   Smoking status: Never      Passive exposure: Yes   Smokeless tobacco: Never   Tobacco comments:      "whole family" smoked in the house  Vaping Use   Vaping status: Never Used  Substance and Sexual Activity   Alcohol use: No   Drug use: No   Sexual activity: Not on file  Other Topics Concern   Not on file  Social History Narrative   Not on file    Social Determinants  of Health    Financial Resource Strain: Not on file  Food Insecurity: Not on file  Transportation Needs: Not on file  Physical Activity: Not on file  Stress: Not on file  Social Connections: Not on file  Intimate Partner Violence: Not on file             Prior to Admission medications   Medication Sig Start Date End Date Taking? Authorizing Provider  ALPRAZolam (XANAX) 0.25 MG tablet Take 0.25 mg by mouth at bedtime.  07/26/18   Yes [provider]  amiodarone (PACERONE) 200 MG tablet Take 0.5 tablets (100 mg total) by mouth 2 (two) times daily. 06/29/22   Yes Revankar, Aundra Dubin, MD  ezetimibe (ZETIA) 10 MG tablet Take 10 mg by mouth daily.     Yes [provider]  JARDIANCE 25 MG TABS tablet Take 25 mg by mouth daily. 01/29/20   Yes [provider]  levothyroxine (SYNTHROID) 88 MCG tablet Take 88 mcg by mouth daily before breakfast.     Yes [provider]  metFORMIN (GLUCOPHAGE) 850 MG tablet Take 850 mg by mouth 3 (three) times daily.     Yes [provider]  metoprolol succinate (TOPROL-XL) 25 MG 24 hr tablet Take 1 tablet (25 mg total) by mouth 2 (two) times daily. 07/22/22   Yes Revankar, Aundra Dubin, MD  nitroGLYCERIN (NITROSTAT) 0.4 MG SL tablet Place 0.4 mg under the tongue every 5 (five) minutes as needed for chest pain.     Yes [provider]  prednisoLONE acetate (PRED FORTE) 1 % ophthalmic suspension Place 1 drop into the left eye 4 (four) times daily.     Yes [provider]  raloxifene (EVISTA) 60 MG tablet Take 1 tablet by mouth once daily 10/23/22   Yes [name], MD  rivaroxaban (XARELTO) 20 MG TABS tablet Take 20 mg by mouth daily with supper.     Yes [provider]  torsemide (DEMADEX) 20 MG tablet Take 20 mg by mouth 2 (two) times daily.     Yes [provider]  traMADol (ULTRAM) 50 MG tablet Take 50 mg by mouth 2 (two) times daily as needed for moderate pain. 01/11/20   Yes [provider]            Current Outpatient Medications  Medication Sig Dispense Refill   ALPRAZolam (XANAX) 0.25 MG tablet Take 0.25 mg by mouth at bedtime.        amiodarone (  PACERONE) 200 MG tablet Take 0.5 tablets (100 mg total) by mouth 2 (two) times daily. 90 tablet 2   ezetimibe (ZETIA) 10 MG tablet Take 10 mg by mouth daily.       JARDIANCE 25 MG TABS tablet Take 25 mg by mouth daily.       levothyroxine (SYNTHROID) 88 MCG tablet Take 88 mcg by mouth daily before breakfast.       metFORMIN (GLUCOPHAGE) 850 MG tablet Take 850 mg by mouth 3 (three) times daily.       metoprolol succinate (TOPROL-XL) 25 MG 24 hr tablet Take 1 tablet (25 mg total) by mouth 2 (two) times daily. 180 tablet 3   nitroGLYCERIN (NITROSTAT) 0.4 MG SL tablet Place 0.4 mg under the tongue every 5 (five) minutes as needed for chest pain.       prednisoLONE acetate (PRED FORTE) 1 % ophthalmic suspension Place 1 drop into the left eye 4 (four) times daily.       raloxifene (EVISTA) 60 MG tablet Take 1 tablet by mouth once daily 30 tablet 5   rivaroxaban (XARELTO) 20 MG TABS tablet Take 20 mg by mouth daily with supper.       torsemide (DEMADEX) 20 MG tablet Take 20 mg by mouth 2 (two) times daily.       traMADol (ULTRAM) 50 MG tablet Take 50 mg by mouth 2 (two) times daily as needed for moderate pain.          No current facility-administered medications for this visit.        Allergies  No Known Allergies         Review of Systems:               General:                      + loss of appetite, + decreased energy, no weight gain, no weight loss, no fever             Cardiac:                       no chest pain with exertion, no chest pain at rest, +SOB with mild exertion, no resting SOB, no PND, no orthopnea, no palpitations, + arrhythmia, + atrial fibrillation, + LE edema, + dizzy spells, no syncope             Respiratory:                 + exertional shortness of breath, no home oxygen, no productive  cough, no dry cough, no bronchitis, no wheezing, no hemoptysis, no asthma, no pain with inspiration or cough, no sleep apnea, no CPAP at night             GI:                               no difficulty swallowing, no reflux, no frequent heartburn, no hiatal hernia, no abdominal pain, no constipation, no diarrhea, no hematochezia, no hematemesis, no melena             GU:                              no dysuria,  no frequency, no urinary tract infection, no hematuria, no kidney stones, no kidney disease  Vascular:                     no pain suggestive of claudication, no pain in feet, no leg cramps, no varicose veins, no DVT, no non-healing foot ulcer             Neuro:                         no stroke, no TIA's, no seizures, no headaches, no temporary blindness one eye,  no slurred speech, no peripheral neuropathy, no chronic pain, no instability of gait, no memory/cognitive dysfunction             Musculoskeletal:         no arthritis, no joint swelling, + myalgias, + difficulty walking, + decreased mobility              Skin:                            no rash, no itching, no skin infections, no pressure sores or ulcerations             Psych:                         no anxiety, no depression, no nervousness, no unusual recent stress             Eyes:                           no blurry vision, no floaters, + recent vision changes, no  glasses or contacts             ENT:                            no hearing loss, no loose or painful teeth, + dentures             Hematologic:               no easy bruising, no abnormal bleeding, no clotting disorder, no frequent epistaxis             Endocrine:                   + diabetes, does check CBG's at home                            Physical Exam:               BP (!) 143/78 (BP Location: Left Arm, Patient Position: Sitting)   Pulse 68   Resp 18   Ht 5' (1.524 m)   Wt 206 lb (93.4 kg)   SpO2 94% Comment: RA  BMI 40.23 kg/m               General:                      well-appearing             HEENT:                       Unremarkable, NCAT, PERLA, EOMI             Neck:  no JVD, no bruits, no adenopathy or thyromegaly             Chest:                          clear to auscultation, symmetrical breath sounds, no wheezes, no rhonchi              CV:                              RRR, 3/6 systolic murmur RSB, no diastolic murmur             Abdomen:                    soft, non-tender, no masses              Extremities:                 warm, well-perfused, pedal pulses not palpable , mild bilateral lower extremity edema             Rectal/GU                   Deferred             Neuro:                         Grossly non-focal and symmetrical throughout             Skin:                            Clean and dry, no rashes, no breakdown   Diagnostic Tests:   Physicians   Panel Physicians Referring Physician Case Authorizing Physician  End, Cristal Deer, MD (Primary)        Procedures   RIGHT/LEFT HEART CATH AND CORONARY ANGIOGRAPHY    Conclusion   Conclusions: Mild to moderate, non-obstructive coronary artery disease with up to 50% stenosis of the mid LAD. Mildly to moderately elevated left heart filling pressures (LVEDP 20 mmHg, PCWP 32 mm).  Prominent v-waves on PCWP tracing and discrepancy in pressures between PCWP and LVEDP suggest underling mitral valve disease. Severely elevated right heart and pulmonary artery pressures (RA 17 mmHg, mean PA 48 mmHg). Normal Fick cardiac output/index. Moderate to severe aortic valve stenosis (mean gradient 36 mmHg, valve area 0.8 cm^2).   Recommendations: Escalate diuresis. Restart rivaroxaban tomorrow if no evidence of bleeding/vascular injury at catheterization sites. Consultation with structural heart and advanced heart failure teams should be considered.   Yvonne Kendall, MD [facility]     Recommendations       Antiplatelet/Anticoag  Recommend to resume Rivaroxaban, at currently prescribed dose and frequency on 12/08/2022. Concurrent antiplatelet therapy not recommended.  Discharge Date In the absence of any other complications or medical issues, we expect the patient to be ready for discharge from a cath perspective on 12/07/2022.    Surgeon Notes       12/07/2022  8:57 AM Brief Op Note signed by Yvonne Kendall, MD    Indications   Severe aortic stenosis [I35.0 (ICD-10-CM)]  Chronic heart failure with preserved ejection fraction (HCC) [I50.32 (ICD-10-CM)]    Clinical Presentation   CHF/Shock Congestive heart failure present. NYHA Class III. No shock present.    Procedural Details   Technical Details Indication: 67  y.o. year-old woman with history of aortic stenosis (reportedly severe on echo earlier this month at [facility]), chronic HFpEF, PAD, paroxysmal atrial fibrillation, hypertension, hyperlipidemia, and type 2 diabetes mellitus, presenting for evaluation of progressive shortness of breath and edema.  GFR: >60 ml/min  Procedure: The risks, benefits, complications, treatment options, and expected outcomes were discussed with the patient. The patient and/or family concurred with the proposed plan, giving informed consent. The right wrist and elbow were prepped and draped in a sterile fashion. 1% lidocaine was used for local anesthesia. A previously placed antecubital vein IV was exchanged for a 47F slender Glidesheath using modified Seldinger technique. Right heart catheterization was performed by advancing a 47F balloon-tipped catheter through the right heart chambers into the pulmonary capillary wedge position. Pressure measurements and oxygen saturations were obtained.  Using the modified Seldinger access technique, a 21F slender Glidesheath was placed in the right radial artery. 3 mg Verapamil was given through the sheath. Heparin 4,500 units were administered. Selective coronary angiography was performed  using a 47F JL3.5 catheter to engage the left coronary artery and a 47F JR4 catheter to engage the right coronary artery. Left heart catheterization was performed using a 47F JR4 catheter and a straight wire. Left ventriculogram was not performed. Aortic valve gradient was calculated using the pullback method, as a dual-lumen catheter was not available.  At the end of the procedure, the radial artery sheath was removed and a TR band applied to achieve patent hemostasis. The antecubital vein sheath was removed and hemostasis achieved with manual compression. There were no immediate complications. The patient was taken to the recovery area in stable condition.   Estimated blood loss <50 mL.   During this procedure medications were administered to achieve and maintain moderate conscious sedation while the patient's heart rate, blood pressure, and oxygen saturation were continuously monitored and I was present face-to-face 100% of this time.    Medications (Filter: Administrations occurring from 0727 to 0840 on 12/07/22) midazolam (VERSED) injection (mg)  Total dose: 1 mg Date/Time Rate/Dose/Volume Action    12/07/22 0743 1 mg Given    fentaNYL (SUBLIMAZE) injection (mcg)  Total dose: 25 mcg Date/Time Rate/Dose/Volume Action    12/07/22 0743 25 mcg Given    lidocaine (PF) (XYLOCAINE) 1 % injection (mL)  Total volume: 6 mL Date/Time Rate/Dose/Volume Action    12/07/22 0748 2 mL Given    0749 4 mL Given    Heparin (Porcine) in NaCl 1000-0.9 UT/500ML-% SOLN (mL)  Total volume: 1,500 mL Date/Time Rate/Dose/Volume Action    12/07/22 0753 500 mL Given    0753 500 mL Given    0754 500 mL Given    Radial Cocktail/Verapamil only (mL)  Total volume: 10 mL Date/Time Rate/Dose/Volume Action    12/07/22 0757 10 mL Given    heparin sodium (porcine) injection (Units)  Total dose: 4,500 Units Date/Time Rate/Dose/Volume Action    12/07/22 0758 4,500 Units Given    iohexol (OMNIPAQUE) 350 MG/ML  injection (mL)  Total volume: 39 mL Date/Time Rate/Dose/Volume Action    12/07/22 0816 39 mL Given      Sedation Time   Sedation Time Physician-1: 56 minutes 14 seconds Contrast        Administrations occurring from 0727 to 0840 on 12/07/22:  Medication Name Total Dose  iohexol (OMNIPAQUE) 350 MG/ML injection 39 mL    Radiation/Fluoro   Fluoro time: 6.2 (min) DAP: 21.5 (Gycm2) Cumulative Air Kerma: 457.2 (mGy) Complications   Complications documented before study  signed (12/07/2022  4:10 PM)    No complications were associated with this study.  Documented by Argie Ramming, RN - 12/07/2022  8:17 AM      Coronary Findings   Diagnostic Dominance: Right Left Main  Vessel is large. Vessel is angiographically normal.    Left Anterior Descending  Vessel is large.  Mid LAD lesion is 50% stenosed. The lesion is irregular. The lesion is mildly calcified.    First Diagonal Branch  Vessel is moderate in size.    Ramus Intermedius  Vessel is moderate in size.  Ramus lesion is 30% stenosed.    Left Circumflex  Vessel is large.  Ost Cx to Prox Cx lesion is 30% stenosed.    First Obtuse Marginal Branch  Vessel is small in size.    Second Obtuse Marginal Branch  Vessel is moderate in size.    Third Obtuse Marginal Branch  Vessel is moderate in size.    Right Coronary Artery  Vessel is large.  Ost RCA lesion is 30% stenosed.    Right Ventricular Branch  RV Branch lesion is 95% stenosed.    Intervention    No interventions have been documented.    Right Heart   Right Heart Pressures RA (mean): 17 mmHg RV (S/EDP): 82/18 mmHg PA (S/D, mean): 79/32 (48) mmHg PCWP (mean): 32 mmHg with prominent v-waves  Ao sat: 94% PA sat: 67%  Fick CO: 5.8 L/min Fick CI: 3.0 L/min/m^2  PVR: 2.8 Wood units    Left Heart   Left Ventricle LV end diastolic pressure is mildly elevated. LVEDP 20 mmHg.  Aortic Valve Moderate to severe aortic valve stenosis (mean gradient 36  mmHg, valve area 0.8 cm^2).    Coronary Diagrams   Diagnostic Dominance: Right  Intervention    Implants    No implant documentation for this case.    Syngo Images    Show images for CARDIAC CATHETERIZATION Images on Long Term Storage    Show images for Kahleesi, Tetter Cox Link to Procedure Log   Procedure Log    Link to Procedure Log   Procedure Log    Hemo Data   Flowsheet Row Most Recent Value  Fick Cardiac Output 5.81 L/min  Fick Cardiac Output Index 3.02 (L/min)/BSA  Aortic Mean Gradient 35.91 mmHg  Aortic Peak Gradient 40.1 mmHg  Aortic Valve Area 0.78  Aortic Value Area Index 0.41 cm2/BSA  RA A Wave 15 mmHg  RA V Wave 15 mmHg  RA Mean 10 mmHg  RV Systolic Pressure 75 mmHg  RV Diastolic Pressure 7 mmHg  RV EDP 15 mmHg  PA Systolic Pressure 65 mmHg  PA Diastolic Pressure 21 mmHg  PA Mean 44 mmHg  PW A Wave 29 mmHg  PW V Wave 41 mmHg  PW Mean 26 mmHg  AO Systolic Pressure 131 mmHg  AO Diastolic Pressure 60 mmHg  AO Mean 88 mmHg  LV Systolic Pressure 166 mmHg  LV Diastolic Pressure -4 mmHg  LV EDP 9 mmHg  AOp Systolic Pressure 125 mmHg  AOp Diastolic Pressure 59 mmHg  AOp Mean Pressure 86 mmHg  LVp Systolic Pressure 163 mmHg  LVp Diastolic Pressure -2 mmHg  LVp EDP Pressure 9 mmHg  QP/QS 1  TPVR Index 14.58 HRUI  TSVR Index 29.16 HRUI  PVR SVR Ratio 0.23  TPVR/TSVR Ratio 0.5    ADDENDUM REPORT: 12/31/2022 11:27   CLINICAL DATA:  Aortic stenosis   EXAM: Cardiac TAVR CT   TECHNIQUE: The patient was  scanned on a [employer] 192 slice scanner. A 120 kV retrospective scan was triggered in the descending thoracic aorta at 111 HU's. Gantry rotation speed was 270 msecs and collimation was .9 mm. No beta blockade or nitro were given. The 3D data set was reconstructed in 5% intervals of the R-R cycle. Systolic and diastolic phases were analyzed on a dedicated work station using MPR, MIP and VRT modes. The patient received 80 cc of  contrast.   FINDINGS: Aortic Valve: Tri leaflet calcified with restricted leaflet motion Calcium score 2150   Aorta: No aneurysm mild calcific atherosclerosis   Sinotubular Junction: 29 mm   Ascending Thoracic Aorta: 35 mm   Aortic Arch: Bovine arch 24 mm   Descending Thoracic Aorta: 23 mm   Sinus of Valsalva Measurements:   Non-coronary: 31.2 mm   Height 21.8 mm   Right - coronary: 30.5   mm  height 24.4 mm   Left - coronary: 30.9 mm   Height 23.5 mm   Coronary Artery Height above Annulus:   Left Main: 12.7 mm above annulus   Right Coronary: 18.2 mm above annulus   Virtual Basal Annulus Measurements:   Maximum/Minimum Diameter: 25.2 mm x 21.4 mm Average diameter 23.8 mm   Perimeter: 76.1 mm   Area: 444 mm2   Coronary Arteries: Sufficient height above annulus for deployment   Optimum Fluoroscopic Angle for Delivery: LAO 23 Caudal 10 degrees   Membranous septal length 7.2 mm   IMPRESSION: 1. Calcified tri leaflet AV with calcium score 2150   2. Annular area of 444 mm2 suitable for a 26 mm Sapien 3 valve Alternatively a 29 mm Medtronic Evolut can be considered   3.  Coronary arteries sufficient height above annulus for deployment   4. Optimum angiographic angle for deployment LAO 23 Caudal 10 degrees   5.  Membranous septal length 7.2 mm   Charlton Haws     Electronically Signed   By: Charlton Haws M.D.   On: 12/31/2022 11:27    Addended by Wendall Stade, MD on 12/31/2022 11:30 AM    Study Result   Narrative & Impression  EXAM: OVER-READ INTERPRETATION  CT CHEST   The following report is a limited chest CT over-read performed by radiologist Dr. Allegra Lai of [facility] on 12/31/2022. This over-read does not include interpretation of cardiac or coronary anatomy or pathology. The cardiac TAVR interpretation by the cardiologist is attached.   COMPARISON:  None Available.   FINDINGS: Extracardiac findings will be described  separately under dictation for contemporaneously obtained CTA chest, abdomen and pelvis.   IMPRESSION: Please see separate dictation for contemporaneously obtained CTA chest, abdomen and pelvis dated 12/31/2022 for full description of relevant extracardiac findings.   Electronically Signed: By: Allegra Lai M.D. On: 12/31/2022 10:02        Narrative & Impression  CLINICAL DATA:  Preop evaluation for aortic valve replacement   EXAM: CT ANGIOGRAPHY CHEST, ABDOMEN AND PELVIS   TECHNIQUE: Multidetector CT imaging through the chest, abdomen and pelvis was performed using the standard protocol during bolus administration of intravenous contrast. Multiplanar reconstructed images and MIPs were obtained and reviewed to evaluate the vascular anatomy.   RADIATION DOSE REDUCTION: This exam was performed according to the departmental dose-optimization program which includes automated exposure control, adjustment of the mA and/or kV according to patient size and/or use of iterative reconstruction technique.   CONTRAST:  95mL OMNIPAQUE IOHEXOL 350 MG/ML SOLN   COMPARISON:  CT  abdomen dated April 24, 2019; CT chest dated June 28, 2017   FINDINGS: CTA CHEST FINDINGS   Cardiovascular: Cardiomegaly. No pericardial effusion. Severe three-vessel coronary artery calcifications. Aortic valve calcifications. Normal caliber thoracic aorta with mild atherosclerotic disease.   Mediastinum/Nodes: Esophagus and thyroid are unremarkable. Mildly enlarged mediastinal and bilateral hilar lymph nodes, likely reactive reference right paratracheal lymph node measuring 10 mm on series 6, image 61.   Lungs/Pleura: Central airways are patent. New patchy ground-glass opacities with more and small bilateral pleural effusions. More focal area of consolidation is seen in the superior portion of the right upper lobe on series 7, image 33, also new when compared with 2019 prior.    Musculoskeletal: No chest wall abnormality. No acute or significant osseous findings.   Review of the MIP images confirms the above findings.   CTA ABDOMEN AND PELVIS FINDINGS   Hepatobiliary: Mildly nodular liver contour. No focal liver abnormality is seen. Status post cholecystectomy. No biliary dilatation.   Pancreas: Unremarkable. No pancreatic ductal dilatation or surrounding inflammatory changes.   Spleen: Normal in size without focal abnormality.   Adrenals/Urinary Tract: bilateral adrenal glands are unremarkable. No hydronephrosis or nephrolithiasis. Indeterminate lesion of the lower pole of the left kidney measuring 13 mm on series 10, image 72, new when compared with prior abdomen CT. Bladder is unremarkable.   Stomach/Bowel: Stomach is within normal limits. Appendix appears normal. Diverticulosis. No evidence of bowel wall thickening, distention, or inflammatory changes.   Vascular/lymphatic: Normal caliber abdominal aorta with moderate atherosclerotic disease. Incidental note is made of large retroperitoneal portosystemic shunt.   Reproductive: Uterus and bilateral adnexa are unremarkable.   Other: No abdominal wall hernia or abnormality. No abdominopelvic ascites.   Musculoskeletal: Mild compression deformity of L3, likely chronic. No acute or significant osseous findings.   VASCULAR MEASUREMENTS PERTINENT TO TAVR:   AORTA:   Minimal Aortic Diameter-2.4 mm   Severity of Aortic Calcification-moderate   RIGHT PELVIS:   Right Common Iliac Artery -   Minimal Diameter-6.2 mm   Tortuosity-mild   Calcification-severe   Right External Iliac Artery -   Minimal Diameter-7.8 mm   Tortuosity-mild   Calcification-none   Right Common Femoral Artery -   Minimal Diameter-6.7 mm   Tortuosity-none   Calcification-mild   LEFT PELVIS:   Left Common Iliac Artery -   Minimal Diameter-6.3 mm   Tortuosity-none   Calcification-severe   Left  External Iliac Artery -   Minimal Diameter-7.7 mm   Tortuosity-moderate   Calcification-none   Left Common Femoral Artery -   Minimal Diameter-6.4 mm   Tortuosity-none   Calcification-moderate   Review of the MIP images confirms the above findings.   IMPRESSION: Vascular:   1. Vascular findings and measurements pertinent to potential TAVR procedure, as detailed above. 2. Thickening and calcification of the aortic valve, compatible with reported clinical history of aortic stenosis. 3. Moderate to severe aortoiliac atherosclerosis. Three vessel coronary artery disease.   Nonvascular:   1. Patchy ground-glass opacities small bilateral pleural effusions, likely due to pulmonary edema. 2. More focal consolidation is seen in the superior portion of the right lower lobe, could also be due to pulmonary edema, although underlying pulmonary nodule can not be excluded. Recommend follow-up chest CT 3 months to ensure resolution. 3. Cirrhotic liver morphology with sequela of portal hypertension including large retroperitoneal portosystemic shunt. 4. Indeterminate lesion of the lower pole of the left kidney measuring 13 mm, new when compared with prior abdomen CT. Recommend further evaluation  with contrast-enhanced renal protocol MRI or CT.     Electronically Signed   By: Allegra Lai M.D.   On: 12/31/2022 10:37        Impression:   This 67 year old woman has stage D, severe, symptomatic aortic stenosis with NYHA class II symptoms of exertional fatigue and shortness of breath.  She has also been having increasingly frequent episodes of dizziness.  I have personally reviewed her 2D echocardiogram report from [facility], cardiac catheterization, and CTA studies.  Her echocardiogram reportedly shows a mean gradient of 41 mmHg across the aortic valve with a valve area of 0.48 cm and dimensionless index of 0.17 consistent with severe aortic stenosis.  Her cardiac  catheterization shows mild to moderate nonobstructive coronary disease with a 50% mid LAD stenosis.  The mean gradient measured across the aortic valve was 36 mmHg with a valve area of 0.8 cm.  I agree that aortic valve replacement is indicated in this patient for relief of her symptoms and to prevent progressive left ventricular deterioration.  Given her age and comorbid risk factors with a BMI of 40 I think that transcatheter aortic valve replacement would be the best option for treating her.  Her gated cardiac CTA shows anatomy suitable for TAVR using a 29 mm Medtronic Evolute FX valve.  Her abdominal and pelvic CTA shows adequate pelvic vascular anatomy to allow transfemoral insertion.   The patient was counseled at length regarding treatment alternatives for management of severe symptomatic aortic stenosis. The risks and benefits of surgical intervention has been discussed in detail. Long-term prognosis with medical therapy was discussed. Alternative approaches such as conventional surgical aortic valve replacement, transcatheter aortic valve replacement, and palliative medical therapy were compared and contrasted at length. This discussion was placed in the context of the patient's own specific clinical presentation and past medical history. All of her questions have been addressed.    Following the decision to proceed with transcatheter aortic valve replacement, a discussion was held regarding what types of management strategies would be attempted intraoperatively in the event of life-threatening complications, including whether or not the patient would be considered a candidate for the use of cardiopulmonary bypass and/or conversion to open sternotomy for attempted surgical intervention.  I think she would be a candidate for emergent sternotomy to manage any intraoperative complications.  The patient is aware of the fact that transient use of cardiopulmonary bypass may be necessary. The patient has been  advised of a variety of complications that might develop including but not limited to risks of death, stroke, paravalvular leak, aortic dissection or other major vascular complications, aortic annulus rupture, device embolization, cardiac rupture or perforation, mitral regurgitation, acute myocardial infarction, arrhythmia, heart block or bradycardia requiring permanent pacemaker placement, congestive heart failure, respiratory failure, renal failure, pneumonia, infection, other late complications related to structural valve deterioration or migration, or other complications that might ultimately cause a temporary or permanent loss of functional independence or other long term morbidity. The patient provides full informed consent for the procedure as described and all questions were answered.       Plan:   Transfemoral TAVR using a Medtronic valve.

## 2023-01-25 NOTE — Anesthesia Preprocedure Evaluation (Addendum)
Anesthesia Evaluation  Patient identified by MRN, date of birth, ID band Patient awake    Reviewed: Allergy & Precautions, NPO status , Patient's Chart, lab work & pertinent test results  Airway Mallampati: II  TM Distance: >3 FB Neck ROM: Full    Dental  (+) Edentulous Upper, Edentulous Lower, Dental Advisory Given   Pulmonary neg pulmonary ROS   Pulmonary exam normal breath sounds clear to auscultation       Cardiovascular hypertension, Pt. on home beta blockers and Pt. on medications + angina  + CAD  Normal cardiovascular exam+ Valvular Problems/Murmurs  Rhythm:Regular Rate:Normal  Cath 11/2022 1. Mild to moderate, non-obstructive coronary artery disease with up to 50% stenosis of the mid LAD. 2. Mildly to moderately elevated left heart filling pressures (LVEDP 20 mmHg, PCWP 32 mm).  Prominent v-waves on PCWP tracing and discrepancy in pressures between PCWP and LVEDP suggest underling mitral valve disease. 3. Severely elevated right heart and pulmonary artery pressures (RA 17 mmHg, mean PA 48 mmHg). 4. Normal Fick cardiac output/index. 5. Moderate to severe aortic valve stenosis (mean gradient 36 mmHg, valve area 0.8 cm^2).   Recommendations: 1. Escalate diuresis. 2. Restart rivaroxaban tomorrow if no evidence of bleeding/vascular injury at catheterization sites. 3. Consultation with structural heart and advanced heart failure teams should be considered.    Neuro/Psych negative neurological ROS     GI/Hepatic negative GI ROS, Neg liver ROS,,,  Endo/Other  diabetesHypothyroidism    Renal/GU negative Renal ROS     Musculoskeletal negative musculoskeletal ROS (+)    Abdominal  (+) + obese  Peds  Hematology negative hematology ROS (+)   Anesthesia Other Findings   Reproductive/Obstetrics                              Anesthesia Physical Anesthesia Plan  ASA: 4  Anesthesia Plan: MAC    Post-op Pain Management: Minimal or no pain anticipated   Induction:   PONV Risk Score and Plan: 2 and Ondansetron, Dexamethasone, Treatment may vary due to age or medical condition, Propofol infusion and TIVA  Airway Management Planned: Natural Airway  Additional Equipment: Arterial line  Intra-op Plan:   Post-operative Plan:   Informed Consent: I have reviewed the patients History and Physical, chart, labs and discussed the procedure including the risks, benefits and alternatives for the proposed anesthesia with the patient or authorized representative who has indicated his/her understanding and acceptance.     Dental advisory given  Plan Discussed with: CRNA  Anesthesia Plan Comments:          Anesthesia Quick Evaluation

## 2023-01-26 ENCOUNTER — Inpatient Hospital Stay (HOSPITAL_COMMUNITY): Payer: Self-pay | Admitting: Physician Assistant

## 2023-01-26 ENCOUNTER — Inpatient Hospital Stay (HOSPITAL_COMMUNITY)
Admission: RE | Admit: 2023-01-26 | Discharge: 2023-01-28 | DRG: 267 | Disposition: A | Discharge status: Home / Self Care | Payer: PPO | Source: Ambulatory Visit | Attending: Internal Medicine | Admitting: Internal Medicine

## 2023-01-26 ENCOUNTER — Inpatient Hospital Stay (HOSPITAL_COMMUNITY): Payer: Self-pay

## 2023-01-26 ENCOUNTER — Encounter (HOSPITAL_COMMUNITY): Payer: Self-pay | Admitting: Internal Medicine

## 2023-01-26 ENCOUNTER — Encounter (HOSPITAL_COMMUNITY)
Admission: RE | Disposition: A | Discharge status: Home / Self Care | Payer: Self-pay | Source: Ambulatory Visit | Attending: Internal Medicine

## 2023-01-26 ENCOUNTER — Other Ambulatory Visit: Payer: Self-pay

## 2023-01-26 ENCOUNTER — Inpatient Hospital Stay (HOSPITAL_COMMUNITY): Payer: PPO

## 2023-01-26 ENCOUNTER — Other Ambulatory Visit: Payer: Self-pay | Admitting: Physician Assistant

## 2023-01-26 DIAGNOSIS — I350 Nonrheumatic aortic (valve) stenosis: Secondary | ICD-10-CM

## 2023-01-26 DIAGNOSIS — Z79899 Other long term (current) drug therapy: Secondary | ICD-10-CM | POA: Diagnosis not present

## 2023-01-26 DIAGNOSIS — E785 Hyperlipidemia, unspecified: Secondary | ICD-10-CM | POA: Diagnosis present

## 2023-01-26 DIAGNOSIS — I10 Essential (primary) hypertension: Secondary | ICD-10-CM | POA: Diagnosis present

## 2023-01-26 DIAGNOSIS — I5032 Chronic diastolic (congestive) heart failure: Secondary | ICD-10-CM | POA: Diagnosis present

## 2023-01-26 DIAGNOSIS — Z7901 Long term (current) use of anticoagulants: Secondary | ICD-10-CM | POA: Diagnosis not present

## 2023-01-26 DIAGNOSIS — I440 Atrioventricular block, first degree: Secondary | ICD-10-CM | POA: Diagnosis present

## 2023-01-26 DIAGNOSIS — Z7984 Long term (current) use of oral hypoglycemic drugs: Secondary | ICD-10-CM

## 2023-01-26 DIAGNOSIS — I2510 Atherosclerotic heart disease of native coronary artery without angina pectoris: Secondary | ICD-10-CM | POA: Diagnosis present

## 2023-01-26 DIAGNOSIS — Z6841 Body Mass Index (BMI) 40.0 and over, adult: Secondary | ICD-10-CM | POA: Diagnosis not present

## 2023-01-26 DIAGNOSIS — Z006 Encounter for examination for normal comparison and control in clinical research program: Secondary | ICD-10-CM

## 2023-01-26 DIAGNOSIS — Z7722 Contact with and (suspected) exposure to environmental tobacco smoke (acute) (chronic): Secondary | ICD-10-CM | POA: Diagnosis not present

## 2023-01-26 DIAGNOSIS — I4510 Unspecified right bundle-branch block: Secondary | ICD-10-CM | POA: Diagnosis not present

## 2023-01-26 DIAGNOSIS — I5033 Acute on chronic diastolic (congestive) heart failure: Secondary | ICD-10-CM | POA: Diagnosis not present

## 2023-01-26 DIAGNOSIS — I110 Hypertensive heart disease with heart failure: Secondary | ICD-10-CM | POA: Diagnosis present

## 2023-01-26 DIAGNOSIS — I480 Paroxysmal atrial fibrillation: Secondary | ICD-10-CM

## 2023-01-26 DIAGNOSIS — Z952 Presence of prosthetic heart valve: Principal | ICD-10-CM

## 2023-01-26 DIAGNOSIS — Z79890 Hormone replacement therapy (postmenopausal): Secondary | ICD-10-CM

## 2023-01-26 DIAGNOSIS — I442 Atrioventricular block, complete: Secondary | ICD-10-CM | POA: Insufficient documentation

## 2023-01-26 DIAGNOSIS — Z833 Family history of diabetes mellitus: Secondary | ICD-10-CM | POA: Diagnosis not present

## 2023-01-26 DIAGNOSIS — E1151 Type 2 diabetes mellitus with diabetic peripheral angiopathy without gangrene: Secondary | ICD-10-CM | POA: Diagnosis present

## 2023-01-26 DIAGNOSIS — E088 Diabetes mellitus due to underlying condition with unspecified complications: Secondary | ICD-10-CM | POA: Diagnosis present

## 2023-01-26 DIAGNOSIS — Z86000 Personal history of in-situ neoplasm of breast: Secondary | ICD-10-CM

## 2023-01-26 DIAGNOSIS — Z9049 Acquired absence of other specified parts of digestive tract: Secondary | ICD-10-CM

## 2023-01-26 DIAGNOSIS — E876 Hypokalemia: Secondary | ICD-10-CM | POA: Diagnosis not present

## 2023-01-26 DIAGNOSIS — D0511 Intraductal carcinoma in situ of right breast: Secondary | ICD-10-CM | POA: Diagnosis present

## 2023-01-26 DIAGNOSIS — E119 Type 2 diabetes mellitus without complications: Secondary | ICD-10-CM | POA: Diagnosis not present

## 2023-01-26 HISTORY — DX: Hypothyroidism, unspecified: E03.9

## 2023-01-26 HISTORY — PX: INTRAOPERATIVE TRANSTHORACIC ECHOCARDIOGRAM: SHX6523

## 2023-01-26 HISTORY — PX: TRANSCATHETER AORTIC VALVE REPLACEMENT, TRANSFEMORAL: SHX6400

## 2023-01-26 HISTORY — DX: Atrioventricular block, complete: I44.2

## 2023-01-26 HISTORY — DX: Presence of prosthetic heart valve: Z95.2

## 2023-01-26 LAB — POCT I-STAT, CHEM 8
BUN: 10 mg/dL (ref 8–23)
Calcium, Ion: 1.14 mmol/L — ABNORMAL LOW (ref 1.15–1.40)
Chloride: 102 mmol/L (ref 98–111)
Creatinine, Ser: 0.8 mg/dL (ref 0.44–1.00)
Glucose, Bld: 160 mg/dL — ABNORMAL HIGH (ref 70–99)
HCT: 31 % — ABNORMAL LOW (ref 36.0–46.0)
Hemoglobin: 10.5 g/dL — ABNORMAL LOW (ref 12.0–15.0)
Potassium: 4 mmol/L (ref 3.5–5.1)
Sodium: 140 mmol/L (ref 135–145)
TCO2: 25 mmol/L (ref 22–32)

## 2023-01-26 LAB — ECHOCARDIOGRAM LIMITED
AR max vel: 2.6 cm2
AV Area VTI: 2.66 cm2
AV Area mean vel: 2.53 cm2
AV Mean grad: 7 mmHg
AV Peak grad: 13.8 mmHg
Ao pk vel: 1.86 m/s

## 2023-01-26 LAB — GLUCOSE, CAPILLARY
Glucose-Capillary: 137 mg/dL — ABNORMAL HIGH (ref 70–99)
Glucose-Capillary: 148 mg/dL — ABNORMAL HIGH (ref 70–99)
Glucose-Capillary: 123 mg/dL — ABNORMAL HIGH (ref 70–99)
Glucose-Capillary: 149 mg/dL — ABNORMAL HIGH (ref 70–99)
Glucose-Capillary: 128 mg/dL — ABNORMAL HIGH (ref 70–99)

## 2023-01-26 LAB — ABO/RH: ABO/RH(D): O POS

## 2023-01-26 SURGERY — IMPLANTATION, AORTIC VALVE, TRANSCATHETER, FEMORAL APPROACH
Anesthesia: Monitor Anesthesia Care | Laterality: Right

## 2023-01-26 MED ORDER — ACETAMINOPHEN 325 MG PO TABS
650.0000 mg | ORAL_TABLET | Freq: Four times a day (QID) | ORAL | Status: DC | PRN
  Administered 2023-01-26: 650 mg via ORAL
  Filled 2023-01-26: qty 2

## 2023-01-26 MED ORDER — PROTAMINE SULFATE 10 MG/ML IV SOLN
INTRAVENOUS | Status: AC
  Filled 2023-01-26: qty 10

## 2023-01-26 MED ORDER — EMPAGLIFLOZIN 25 MG PO TABS
25.0000 mg | ORAL_TABLET | Freq: Every day | ORAL | Status: DC
  Administered 2023-01-27 – 2023-01-28 (×2): 25 mg via ORAL
  Filled 2023-01-26 (×2): qty 1

## 2023-01-26 MED ORDER — CHLORHEXIDINE GLUCONATE 0.12 % MT SOLN
15.0000 mL | Freq: Once | OROMUCOSAL | Status: AC
  Administered 2023-01-26: 15 mL via OROMUCOSAL
  Filled 2023-01-26 (×2): qty 15

## 2023-01-26 MED ORDER — ALPRAZOLAM 0.25 MG PO TABS
0.2500 mg | ORAL_TABLET | Freq: Every day | ORAL | Status: DC
  Administered 2023-01-26 – 2023-01-27 (×2): 0.25 mg via ORAL
  Filled 2023-01-26 (×2): qty 1

## 2023-01-26 MED ORDER — RALOXIFENE HCL 60 MG PO TABS
60.0000 mg | ORAL_TABLET | Freq: Every day | ORAL | Status: DC
  Administered 2023-01-27 – 2023-01-28 (×2): 60 mg via ORAL
  Filled 2023-01-26 (×2): qty 1

## 2023-01-26 MED ORDER — INSULIN ASPART 100 UNIT/ML IJ SOLN
0.0000 [IU] | Freq: Three times a day (TID) | INTRAMUSCULAR | Status: DC
  Administered 2023-01-26 (×3): 2 [IU] via SUBCUTANEOUS
  Administered 2023-01-27: 4 [IU] via SUBCUTANEOUS
  Administered 2023-01-27: 2 [IU] via SUBCUTANEOUS
  Administered 2023-01-27: 4 [IU] via SUBCUTANEOUS
  Administered 2023-01-28: 2 [IU] via SUBCUTANEOUS

## 2023-01-26 MED ORDER — CHLORHEXIDINE GLUCONATE 4 % EX SOLN
30.0000 mL | CUTANEOUS | Status: DC
  Filled 2023-01-26: qty 30

## 2023-01-26 MED ORDER — NOREPINEPHRINE 4 MG/250ML-% IV SOLN
INTRAVENOUS | Status: DC | PRN
Start: 2023-01-26 — End: 2023-01-26
  Administered 2023-01-26: 2 ug/min via INTRAVENOUS

## 2023-01-26 MED ORDER — ONDANSETRON HCL 4 MG/2ML IJ SOLN: 4.0000 mg | Freq: Four times a day (QID) | INTRAMUSCULAR | Status: DC | PRN

## 2023-01-26 MED ORDER — SODIUM CHLORIDE 0.9 % IV SOLN: INTRAVENOUS | Status: AC

## 2023-01-26 MED ORDER — SODIUM CHLORIDE 0.9 % IV SOLN
250.0000 mL | INTRAVENOUS | Status: DC
  Administered 2023-01-26: 250 mL via INTRAVENOUS

## 2023-01-26 MED ORDER — LIDOCAINE HCL (PF) 1 % IJ SOLN
INTRAMUSCULAR | Status: AC
  Filled 2023-01-26: qty 30

## 2023-01-26 MED ORDER — DEXMEDETOMIDINE HCL IN NACL 400 MCG/100ML IV SOLN
INTRAVENOUS | Status: DC | PRN
Start: 2023-01-26 — End: 2023-01-26
  Administered 2023-01-26: 1 ug/kg/h via INTRAVENOUS

## 2023-01-26 MED ORDER — SODIUM CHLORIDE 0.9 % IV SOLN: INTRAVENOUS | Status: DC

## 2023-01-26 MED ORDER — AMIODARONE HCL 200 MG PO TABS
100.0000 mg | ORAL_TABLET | Freq: Two times a day (BID) | ORAL | Status: DC
  Administered 2023-01-27 – 2023-01-28 (×3): 100 mg via ORAL
  Filled 2023-01-26 (×4): qty 1

## 2023-01-26 MED ORDER — IODIXANOL 320 MG/ML IV SOLN
INTRAVENOUS | Status: DC | PRN
  Administered 2023-01-26: 120 mL via INTRA_ARTERIAL

## 2023-01-26 MED ORDER — CEFAZOLIN SODIUM-DEXTROSE 2-4 GM/100ML-% IV SOLN
2.0000 g | Freq: Three times a day (TID) | INTRAVENOUS | Status: AC
  Administered 2023-01-26 (×2): 2 g via INTRAVENOUS
  Filled 2023-01-26 (×2): qty 100

## 2023-01-26 MED ORDER — ACETAMINOPHEN 650 MG RE SUPP: 650.0000 mg | Freq: Four times a day (QID) | RECTAL | Status: DC | PRN

## 2023-01-26 MED ORDER — PHENYLEPHRINE 80 MCG/ML (10ML) SYRINGE FOR IV PUSH (FOR BLOOD PRESSURE SUPPORT)
PREFILLED_SYRINGE | INTRAVENOUS | Status: DC | PRN
  Administered 2023-01-26: 40 ug via INTRAVENOUS

## 2023-01-26 MED ORDER — TRAMADOL HCL 50 MG PO TABS: 50.0000 mg | ORAL_TABLET | ORAL | Status: DC | PRN

## 2023-01-26 MED ORDER — EZETIMIBE 10 MG PO TABS
10.0000 mg | ORAL_TABLET | Freq: Every day | ORAL | Status: DC
  Administered 2023-01-27 – 2023-01-28 (×2): 10 mg via ORAL
  Filled 2023-01-26 (×2): qty 1

## 2023-01-26 MED ORDER — LACTATED RINGERS IV SOLN
INTRAVENOUS | Status: DC | PRN
Start: 2023-01-26 — End: 2023-01-26

## 2023-01-26 MED ORDER — DROPERIDOL 2.5 MG/ML IJ SOLN: 0.6250 mg | Freq: Once | INTRAMUSCULAR | Status: DC | PRN

## 2023-01-26 MED ORDER — FENTANYL CITRATE (PF) 250 MCG/5ML IJ SOLN: INTRAMUSCULAR | Status: DC | PRN

## 2023-01-26 MED ORDER — NITROGLYCERIN IN D5W 200-5 MCG/ML-% IV SOLN: 0.0000 ug/min | INTRAVENOUS | Status: DC

## 2023-01-26 MED ORDER — FENTANYL CITRATE (PF) 100 MCG/2ML IJ SOLN
INTRAMUSCULAR | Status: DC | PRN
  Administered 2023-01-26 (×4): 25 ug via INTRAVENOUS

## 2023-01-26 MED ORDER — HEPARIN SODIUM (PORCINE) 1000 UNIT/ML IJ SOLN
INTRAMUSCULAR | Status: DC | PRN
Start: 2023-01-26 — End: 2023-01-26
  Administered 2023-01-26: 14000 [IU] via INTRAVENOUS

## 2023-01-26 MED ORDER — SODIUM CHLORIDE 0.9% FLUSH: 3.0000 mL | INTRAVENOUS | Status: DC | PRN

## 2023-01-26 MED ORDER — HEPARIN (PORCINE) IN NACL 1000-0.9 UT/500ML-% IV SOLN
INTRAVENOUS | Status: DC | PRN
  Administered 2023-01-26 (×2): 500 mL

## 2023-01-26 MED ORDER — CHLORHEXIDINE GLUCONATE CLOTH 2 % EX PADS
6.0000 | MEDICATED_PAD | Freq: Every day | CUTANEOUS | Status: DC
  Administered 2023-01-26 – 2023-01-28 (×3): 6 via TOPICAL

## 2023-01-26 MED ORDER — AMIODARONE HCL 100 MG PO TABS
100.0000 mg | ORAL_TABLET | Freq: Two times a day (BID) | ORAL | Status: DC
  Filled 2023-01-26: qty 1

## 2023-01-26 MED ORDER — SODIUM CHLORIDE 0.9% FLUSH
3.0000 mL | Freq: Two times a day (BID) | INTRAVENOUS | Status: DC
  Administered 2023-01-26 – 2023-01-28 (×4): 3 mL via INTRAVENOUS

## 2023-01-26 MED ORDER — TRAMADOL HCL 50 MG PO TABS: 50.0000 mg | ORAL_TABLET | Freq: Two times a day (BID) | ORAL | Status: DC | PRN

## 2023-01-26 MED ORDER — CHLORHEXIDINE GLUCONATE 4 % EX SOLN: 60.0000 mL | Freq: Once | CUTANEOUS | Status: DC

## 2023-01-26 MED ORDER — OXYCODONE HCL 5 MG PO TABS: 5.0000 mg | ORAL_TABLET | ORAL | Status: DC | PRN

## 2023-01-26 MED ORDER — LIDOCAINE HCL (PF) 1 % IJ SOLN
INTRAMUSCULAR | Status: DC | PRN
  Administered 2023-01-26: 10 mL
  Administered 2023-01-26: 5 mL
  Administered 2023-01-26: 10 mL

## 2023-01-26 MED ORDER — ONDANSETRON HCL 4 MG/2ML IJ SOLN
INTRAMUSCULAR | Status: DC | PRN
  Administered 2023-01-26: 4 mg via INTRAVENOUS

## 2023-01-26 MED ORDER — MORPHINE SULFATE (PF) 2 MG/ML IV SOLN: 1.0000 mg | INTRAVENOUS | Status: DC | PRN

## 2023-01-26 MED ORDER — FENTANYL CITRATE (PF) 100 MCG/2ML IJ SOLN
INTRAMUSCULAR | Status: AC
  Filled 2023-01-26: qty 2

## 2023-01-26 MED ORDER — SODIUM CHLORIDE 0.9 % IV SOLN: 250.0000 mL | INTRAVENOUS | Status: DC | PRN

## 2023-01-26 MED ORDER — LEVOTHYROXINE SODIUM 88 MCG PO TABS
88.0000 ug | ORAL_TABLET | Freq: Every day | ORAL | Status: DC
  Administered 2023-01-27 – 2023-01-28 (×2): 88 ug via ORAL
  Filled 2023-01-26 (×2): qty 1

## 2023-01-26 MED ORDER — CLEVIDIPINE BUTYRATE 0.5 MG/ML IV EMUL
INTRAVENOUS | Status: DC | PRN
Start: 2023-01-26 — End: 2023-01-26
  Administered 2023-01-26: 5 mg/h via INTRAVENOUS

## 2023-01-26 MED ORDER — NOREPINEPHRINE 4 MG/250ML-% IV SOLN: 2.0000 ug/min | INTRAVENOUS | Status: DC

## 2023-01-26 MED ORDER — PROTAMINE SULFATE 10 MG/ML IV SOLN
INTRAVENOUS | Status: DC | PRN
  Administered 2023-01-26: 120 mg via INTRAVENOUS
  Administered 2023-01-26: 20 mg via INTRAVENOUS

## 2023-01-26 MED ORDER — ORAL CARE MOUTH RINSE: 15.0000 mL | OROMUCOSAL | Status: DC | PRN

## 2023-01-26 MED ORDER — FENTANYL CITRATE (PF) 100 MCG/2ML IJ SOLN: 25.0000 ug | INTRAMUSCULAR | Status: DC | PRN

## 2023-01-26 MED ORDER — LACTATED RINGERS IV SOLN: INTRAVENOUS | Status: DC

## 2023-01-26 MED ORDER — PREDNISOLONE ACETATE 1 % OP SUSP
1.0000 [drp] | Freq: Four times a day (QID) | OPHTHALMIC | Status: DC
  Administered 2023-01-26 – 2023-01-28 (×8): 1 [drp] via OPHTHALMIC
  Filled 2023-01-26: qty 5

## 2023-01-26 SURGICAL SUPPLY — 35 items
BAG SNAP BAND KOVER 36X36 (MISCELLANEOUS) ×4 IMPLANT
BALLN TRUE 22X4.5 (BALLOONS) ×2
BALLOON TRUE 22X4.5 (BALLOONS) IMPLANT
CABLE ADAPT PACING TEMP 12FT (ADAPTER) IMPLANT
CABLE SURGICAL S-101-97-12 (CABLE) IMPLANT
CATH ANGIO 5F PIGTAIL 100CM (CATHETERS) IMPLANT
CATH DIAG 6FR PIGTAIL ANGLED (CATHETERS) IMPLANT
CATH INFINITI 6F AL1 (CATHETERS) IMPLANT
CATH-GARD ARROW CATH SHIELD (MISCELLANEOUS) ×2
CLOSURE PERCLOSE PROSTYLE (VASCULAR PRODUCTS) IMPLANT
KIT SYRINGE INJ CVI SPIKEX1 (MISCELLANEOUS) IMPLANT
PACK CARDIAC CATHETERIZATION (CUSTOM PROCEDURE TRAY) ×2 IMPLANT
SET ATX-X65L (MISCELLANEOUS) IMPLANT
SHEATH DRYSEAL FLEX 18FR 33CM (SHEATH) IMPLANT
SHEATH PINNACLE 6F 10CM (SHEATH) IMPLANT
SHEATH PINNACLE 8F 10CM (SHEATH) IMPLANT
SHEATH PROBE COVER 6X72 (BAG) IMPLANT
SHIELD CATHGARD ARROW (MISCELLANEOUS) IMPLANT
STOPCOCK MORSE 400PSI 3WAY (MISCELLANEOUS) ×4 IMPLANT
SYS EVOLUT FX DELIVERY 23-29 (CATHETERS) ×2
SYS EVOLUT FX LOADING 23-29 (CATHETERS) ×2
SYSTEM EVOLUT FX DELIVRY 23-29 (CATHETERS) IMPLANT
SYSTEM EVOLUT FX LOADING 23-29 (CATHETERS) IMPLANT
TRANSDUCER W/STOPCOCK (MISCELLANEOUS) ×4 IMPLANT
TUBING CIL FLEX 10 FLL-RA (TUBING) IMPLANT
VALVE EVOLUT FX 29 (Valve) IMPLANT
WIRE AMPLATZ SS-J .035X180CM (WIRE) IMPLANT
WIRE EMERALD 3MM-J .035X150CM (WIRE) IMPLANT
WIRE EMERALD 3MM-J .035X260CM (WIRE) IMPLANT
WIRE EMERALD ST .035X260CM (WIRE) IMPLANT
WIRE MICRO SET SILHO 5FR 7 (SHEATH) IMPLANT
WIRE MICROINTRODUCER 60CM (WIRE) IMPLANT
WIRE PACING TEMP ST TIP 5 (CATHETERS) IMPLANT
WIRE SAFARI SM CURVE 275 (WIRE) IMPLANT
WIRE TORQFLEX AUST .018X40CM (WIRE) IMPLANT

## 2023-01-26 NOTE — Progress Notes (Signed)
IV team consulted for USIV placement. Pt has bilateral hand/wrist Ivs in place on L arm. Assessed R & L forearm for appropriate USIV for levo. Attempted x1 in R forearm without success. Other observed veins too deep for IV watch. 20g RAC placed. Pt demonstrated understanding of when to call RN with any issues with IV. Family member also demonstrated understanding. Primary RN at bedside and instructed to monitor IV closely and keep R arm straight with pillow or arm board. Primary RN demonstrated understanding. States plan is to try to wean pt off levo today. IV watch placed at this time.

## 2023-01-26 NOTE — Interval H&P Note (Signed)
History and Physical Interval Note:  01/26/2023 6:24 AM  Crystal Ramos  has presented today for surgery, with the diagnosis of Severe Aortic Stenosis.  The various methods of treatment have been discussed with the patient and family. After consideration of risks, benefits and other options for treatment, the patient has consented to  Procedure(s): Transcatheter Aortic Valve Replacement, Transfemoral (Right) INTRAOPERATIVE TRANSTHORACIC ECHOCARDIOGRAM (N/A) as a surgical intervention.  The patient's history has been reviewed, patient examined, no change in status, stable for surgery.  I have reviewed the patient's chart and labs.  Questions were answered to the patient's satisfaction.     Alleen Borne

## 2023-01-26 NOTE — Progress Notes (Signed)
  [facility]   MULTIDISCIPLINARY HEART VALVE TEAM  Patient doing well s/p TAVR. She is hemodynamically stable but requiring of levophed. Groin sites stable. ECG with CHB. Tele shows CHB with HRs in the 40s with occasional demand pacing (back up set at 40 bpm). Initial plan was to send to 4E but since she is requiring back up pacing and low dose pressors, will request a 2H bed.     Cline Crock PA-C  [facility]  Pager [phone]

## 2023-01-26 NOTE — Op Note (Signed)
[facility]   MULTIDISCIPLINARY HEART VALVE TEAM     TAVR OPERATIVE NOTE   NAME@ [id number]  Date of Procedure:                 01/26/2023   Preoperative Diagnosis:      Severe Aortic Stenosis    Postoperative Diagnosis:    Same    Procedure:        Transcatheter Aortic Valve Replacement - Percutaneous Right Transfemoral Approach             Medtronic Evolut FX  (size 29 mm, serial # [id number])              Co-Surgeons:            Alleen Borne, MD and Alverda Skeans, MD     Anesthesiologist:                  Jolyne Loa, MD   Echocardiographer:              Lacretia Nicks. O'Neil, MD   Pre-operative Echo Findings: Severe aortic stenosis  Normal left ventricular systolic function   Post-operative Echo Findings: Trace paravalvular leak Normal left ventricular systolic function      BRIEF CLINICAL NOTE AND INDICATIONS FOR SURGERY    This 67 year old woman has stage D, severe, symptomatic aortic stenosis with NYHA class II symptoms of exertional fatigue and shortness of breath.  She has also been having increasingly frequent episodes of dizziness.  I have personally reviewed her 2D echocardiogram report from [facility], cardiac catheterization, and CTA studies.  Her echocardiogram reportedly shows a mean gradient of 41 mmHg across the aortic valve with a valve area of 0.48 cm and dimensionless index of 0.17 consistent with severe aortic stenosis.  Her cardiac catheterization shows mild to moderate nonobstructive coronary disease with a 50% mid LAD stenosis.  The mean gradient measured across the aortic valve was 36 mmHg with a valve area of 0.8 cm.  I agree that aortic valve replacement is indicated in this patient for relief of her symptoms and to prevent progressive left ventricular deterioration.  Given her age and comorbid risk factors with a BMI of 40 I think that transcatheter aortic valve replacement would be the best option for treating her.  Her gated cardiac  CTA shows anatomy suitable for TAVR using a 29 mm Medtronic Evolute FX valve.  Her abdominal and pelvic CTA shows adequate pelvic vascular anatomy to allow transfemoral insertion.   The patient was counseled at length regarding treatment alternatives for management of severe symptomatic aortic stenosis. The risks and benefits of surgical intervention has been discussed in detail. Long-term prognosis with medical therapy was discussed. Alternative approaches such as conventional surgical aortic valve replacement, transcatheter aortic valve replacement, and palliative medical therapy were compared and contrasted at length. This discussion was placed in the context of the patient's own specific clinical presentation and past medical history. All of her questions have been addressed.    Following the decision to proceed with transcatheter aortic valve replacement, a discussion was held regarding what types of management strategies would be attempted intraoperatively in the event of life-threatening complications, including whether or not the patient would be considered a candidate for the use of cardiopulmonary bypass and/or conversion to open sternotomy for attempted surgical intervention.  I think she would be a candidate for emergent sternotomy to manage any intraoperative complications.  The patient is aware of the fact that transient  use of cardiopulmonary bypass may be necessary. The patient has been advised of a variety of complications that might develop including but not limited to risks of death, stroke, paravalvular leak, aortic dissection or other major vascular complications, aortic annulus rupture, device embolization, cardiac rupture or perforation, mitral regurgitation, acute myocardial infarction, arrhythmia, heart block or bradycardia requiring permanent pacemaker placement, congestive heart failure, respiratory failure, renal failure, pneumonia, infection, other late complications related to  structural valve deterioration or migration, or other complications that might ultimately cause a temporary or permanent loss of functional independence or other long term morbidity. The patient provides full informed consent for the procedure as described and all questions were answered.          DETAILS OF THE OPERATIVE PROCEDURE    PREPARATION:    The patient was brought to the operating room on the above mentioned date and appropriate monitoring was established by the anesthesia team. The patient was placed in the supine position on the operating table.  Intravenous antibiotics were administered. The patient was monitored closely throughout the procedure under conscious sedation.   Baseline transthoracic echocardiogram was performed. The patient's abdomen and both groins were prepped and draped in a sterile manner. A time out procedure was performed.   PERIPHERAL ACCESS:    Using the modified Seldinger technique, femoral arterial and venous access was obtained with placement of 6 Fr sheaths on the left and right sides respectively.  A pigtail diagnostic catheter was passed through the left arterial sheath under fluoroscopic guidance into the aortic root.  Aortic root angiography was performed in order to determine the optimal angiographic angle for valve deployment.    TRANSFEMORAL ACCESS:    Percutaneous transfemoral access and sheath placement was performed using ultrasound guidance.  The right common femoral artery was cannulated using a micropuncture needle.  A pair of Abbott Perclose percutaneous closure devices were placed and a 6 French sheath replaced into the femoral artery.  The patient was heparinized systemically and ACT verified > 250 seconds.     An 18 Fr transfemoral Gore Dry-Seal sheath was introduced into the right femoral artery after progressively dilating over an Amplatz superstiff wire. An AL-1 catheter was used to direct a straight-tip exchange length wire across the  native aortic valve into the left ventricle. This was exchanged out for a pigtail catheter and position was confirmed in the LV apex. Simultaneous LV and Ao pressures were recorded.  The pigtail catheter was exchanged for a Safari wire in the LV apex. Direct LV pacing threshold was tested through the [facility] wire and was satisfactory.   BALLOON AORTIC VALVULOPLASTY:    Performed using a 22 x 4.5 True balloon under rapid ventricular pacing.     TRANSCATHETER HEART VALVE DEPLOYMENT:    A Medtronic Evolut FX transcatheter heart valve (size 29 mm) was prepared and loaded into the delivery catheter system per manufacturer's guidelines and the proper orientation of the valve is confirmed under fluoroscopy. The delivery system and inline sheath were inserted into the right common femoral artery over the [facility] wire and the inline sheath advanced into the abdominal aorta under fluoroscopic guidance. The delivery catheter was advanced around the aortic arch and the valve was carefully positioned across the aortic valve annulus. An aortic root injection was performed to confirm position and the valve deployed using cusp overlap technique under fluoroscopic guidance. Intermittent pacing was used during valve deployment. The delivery system and guidewire were retracted into the descending aorta and the nosecone  re-sheathed. Valve function was assessed using echocardiography. There is felt to be trace paravalvular leak and no central aortic insufficiency. The patient's hemodynamic recovery following valve deployment is good. The patient did develop heart block with a ventricular rate of 43 and therefore a right internal jugular temporary pacemaker was inserted by Dr. Lynnette Caffey and placed to backup.       PROCEDURE COMPLETION:    The delivery system and in-line sheath were removed and femoral artery closure performed using the Perclose devices.  Protamine was administered once femoral arterial repair was complete. The  pigtail catheter and femoral sheaths were removed with manual pressure used venous for hemostasis and a Perclose closure device for left femoral arterial hemostasis.    The patient tolerated the procedure well and is transported to the cath lab recovery area in stable condition. There were no immediate intraoperative complications. All sponge instrument and needle counts are verified correct at completion of the operation.    No blood products were administered during the operation.   The patient received a total of 120 mL of intravenous contrast during the procedure.     Alleen Borne, MD

## 2023-01-26 NOTE — Transfer of Care (Signed)
Immediate Anesthesia Transfer of Care Note  Patient: Crystal Ramos  Procedure(s) Performed: Transcatheter Aortic Valve Replacement, Transfemoral (Right) INTRAOPERATIVE TRANSTHORACIC ECHOCARDIOGRAM  Patient Location: PACU  Anesthesia Type:MAC  Level of Consciousness: awake, alert , and oriented  Airway & Oxygen Therapy: Patient Spontanous Breathing and Patient connected to face mask oxygen  Post-op Assessment: Report given to RN, Post -op Vital signs reviewed and stable, and Patient moving all extremities X 4  Post vital signs: Reviewed and stable  Last Vitals:  Vitals Value Taken Time  BP 102/54 01/26/23 1000  Temp    Pulse 43 01/26/23 1000  Resp 17 01/26/23 1000  SpO2 100 % 01/26/23 1000  Vitals shown include unfiled device data.  Last Pain:  Vitals:   01/26/23 0623  PainSc: 0-No pain         Complications: There were no known notable events for this encounter.

## 2023-01-26 NOTE — Discharge Summary (Incomplete)
[facility]   MULTIDISCIPLINARY HEART VALVE TEAM  Discharge Summary    Patient ID: Boluwatife Hokett MRN: [id number]; DOB: 11/19/1955  Admit date: 01/26/2023 Discharge date: 01/28/2023  Primary Care Provider: Paulina Fusi, MD  Primary Cardiologist: Garwin Brothers, MD / Dr. Lynnette Caffey & Dr. 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   Severe aortic stenosis   Heart block AV complete (HCC)   Allergies No Known Allergies  Diagnostic Studies/Procedures    TAVR OPERATIVE NOTE     Date of Procedure:                 01/26/2023   Preoperative Diagnosis:      Severe Aortic Stenosis    Postoperative Diagnosis:    Same    Procedure:        Transcatheter Aortic Valve Replacement - Percutaneous Right Transfemoral Approach             Medtronic Evolut FX  (size 29 mm, serial # [id number])              Co-Surgeons:            Alleen Borne, MD and Alverda Skeans, MD     Anesthesiologist:                  Jolyne Loa, MD   Echocardiographer:              Lacretia Nicks. O'Neil, MD   Pre-operative Echo Findings: Severe aortic stenosis  Normal left ventricular systolic function   Post-operative Echo Findings: Trace paravalvular leak Normal left ventricular systolic function   Left ventricular heart catheterization findings: LVEDP of 13 mmHg   Right internal jugular temporary pacemaker placement  _____________    Echo 01/27/23:  completed but pending formal read at the time of discharge   History of Present Illness     Kylyn Benko is a 67 y.o. female with a history of HTN, HLD, DMT2, chronic HFpEF, morbid obesity (BMI 39), chronic diastolic CHF, 1st deg AV block, PAF on Xarelto and severe AS who presented to [facility] on 01/26/23 for planned TAVR.   Echo from [facility] on 01/27/23 showed EF 60% and severe AS with a mean grad 41 mmHg and AVA 0.48 cm2. [facility] 12/07/22 showed mild nonobstructive disease. She had elevated filling pressures with a mean RA pressure of 17 mmHg and a wedge pressure of 32 mmHg. She reported progressive DOE and exertional fatigue.   The patient was evaluated by the multidisciplinary valve team and felt to have severe, symptomatic aortic stenosis and to be a suitable candidate for TAVR, which was set up for 01/26/23.  Hospital Course     Consultants: none   Severe AS: s/p successful TAVR with a 29 Evolut FX THV via the TF approach on 01/26/23. Post operative echo completed but pending formal read. Groin sites are stable. Resumed home Xarelto 20mg  daily. Walked with cardiac rehab with no issues. Plan for discharge  home today with close follow up in the outpatient setting.   Transient CHB: had an underlying 1st degree AV block prior to TAVR. She developed CHB after valve deployment. She is now conducting with her intrinsic rhythm with 1st deg AV block and new RBBB, HR in 60-70s. Discussed with EP who feels there is no indication for PPM at this time. Observed for 48 hours with no recurrent CHB. Will discharge home with a Zio AT.  DMT2: treated with SSI while admitted. Resume home meds at discharge. Okay to resume Metformin after 48 hours after contrast dye exposure.   HTN: BP well controlled. Resumed on home meds.    PAF: resumed home amiodarone and Xarelto. Toprol XL 25mg  BID held given CHB. Will resume Toprol XL but only once a day.    HFpEF: she is euvolemic. LVEDP 13 at the time of TAVR.  Resumed on home torsemide 20mg  BID and Jardiance 10mg  daily.   Hypokalemia: repleted.   Abnormal lung CT: pre TAVR CTs showed "more focal consolidation is seen in the superior portion of the right lower lobe, could also be due to pulmonary edema, although underlying pulmonary nodule can not be excluded. Recommend follow-up chest CT 3 months to ensure resolution." This will be discussed in the outpatient setting.  Renal lesion: pre TAVR CT showed an "Indeterminate lesion of the lower pole of the left kidney measuring 13 mm, new when compared with prior abdomen CT. Recommend further evaluation with contrast-enhanced renal protocol MRI or CT." This will be discussed in the outpatient setting. _____________  Discharge Vitals Blood pressure (!) 132/54, pulse 71, temperature 98 F (36.7 C), temperature source Oral, resp. rate 20, height 5' (1.524 m), weight 95.2 kg, SpO2 95%.  Filed Weights   01/26/23 0556 01/27/23 0500 01/28/23 0500  Weight: 94.3 kg 96.5 kg 95.2 kg    GEN: Well nourished, well developed, in no acute distress HEENT: normal Neck: no JVD or masses Cardiac: RRR; no murmurs, rubs, or gallops,no edema  Respiratory:  clear to auscultation bilaterally, normal work of breathing GI: soft, nontender, nondistended, + BS MS: no deformity or atrophy Skin: warm and dry, no rash.  Groin sites clear without hematoma or ecchymosis  Neuro:  Alert and Oriented x 3, Strength and sensation are intact Psych: euthymic mood, full affect  Labs & Radiologic Studies    CBC Recent Labs    01/27/23 0305 01/28/23 0447  WBC 4.3 4.9  HGB 9.7* 9.7*  HCT 33.1* 32.4*  MCV 80.5 79.6*  PLT PLATELET CLUMPS NOTED ON SMEAR, UNABLE TO ESTIMATE 83*   Basic Metabolic Panel Recent Labs    16/10/96 0305 01/28/23 0447  NA 136 139  K 4.2 3.2*  CL 106 104  CO2 23 25  GLUCOSE 110* 117*  BUN 9 9  CREATININE 0.64 0.85  CALCIUM 8.1* 7.8*  MG 2.0  --    Liver Function Tests No results for input(s):  "AST", "ALT", "ALKPHOS", "BILITOT", "PROT", "ALBUMIN" in the last 72 hours. No results for input(s): "LIPASE", "AMYLASE" in the last 72 hours. Cardiac Enzymes No results for input(s): "CKTOTAL", "CKMB", "CKMBINDEX", "TROPONINI" in the last 72 hours. BNP Invalid input(s): "POCBNP" D-Dimer No results for input(s): "DDIMER" in the last 72 hours. Hemoglobin A1C No results for input(s): "HGBA1C" in the last 72 hours. Fasting Lipid Panel No results for input(s): "CHOL", "HDL", "LDLCALC", "TRIG", "CHOLHDL", "LDLDIRECT" in the last 72 hours. Thyroid Function Tests No results for input(s): "TSH", "T4TOTAL", "T3FREE", "THYROIDAB" in the last 72  hours.  Invalid input(s): "FREET3" _____________  ECHOCARDIOGRAM LIMITED  Result Date: 01/26/2023    ECHOCARDIOGRAM LIMITED REPORT   Patient Name:   Chloee ANN COX Luka Date of Exam: 01/26/2023 Medical Rec #:  [id number]            Height:       60.0 in Accession #:    [id number]           Weight:       208.0 lb Date of Birth:  [birth year]            BSA:          1.898 m Patient Age:    67 years             BP:           143/78 mmHg Patient Gender: F                    HR:           41 bpm. Exam Location:  Inpatient Procedure: Limited Echo, Color Doppler and Cardiac Doppler Indications:     TAVR implantation  History:         Patient has prior history of Echocardiogram examinations, most                  recent 11/26/2022. CHF, Breast Cancer, Aortic Valve Disease and                  s/p TAVR with a 29 mm Evolut FX, Arrythmias:Atrial                  Fibrillation, Signs/Symptoms:Chest Pain; Risk                  Factors:Hypertension and Diabetes.                  Aortic Valve: 29 mm stented (TAVR) valve is present in the                  aortic position. Procedure Date: 01/26/2023.  Sonographer:     Milbert Coulter Referring Phys:  [id number] Orbie Pyo Diagnosing Phys: Lennie Odor MD IMPRESSIONS  1. Echo guided TAVR. 29 mm Evolut Pro. Trivial paravalvular leak. Vmax  1.8 m/s, MG 7 mmHG, EOA 2.66 cm2, DI 0.66. Normal prosthesis. The aortic valve has been repaired/replaced. Aortic valve regurgitation is trivial. There is a 29 mm stented (TAVR) valve present in the aortic position. Procedure Date: 01/26/2023. Echo findings are consistent with normal structure and function of the aortic valve prosthesis.  2. Left ventricular ejection fraction, by estimation, is 55 to 60%. The left ventricle has normal function.  3. Right ventricular systolic function is mildly reduced. The right ventricular size is normal. FINDINGS  Left Ventricle: Left ventricular ejection fraction, by estimation, is 55 to 60%. The left ventricle has normal function. Right Ventricle: The right ventricular size is normal. No increase in right ventricular wall thickness. Right ventricular systolic function is mildly reduced. Pericardium: There is no evidence of pericardial effusion. Aortic Valve: Echo guided TAVR. 29 mm Evolut Pro. Trivial paravalvular leak. Vmax 1.8 m/s, MG 7 mmHG, EOA 2.66 cm2, DI 0.66. Normal prosthesis. The aortic valve has been repaired/replaced. Aortic valve regurgitation is trivial. Aortic valve mean gradient  measures 7.0 mmHg. Aortic valve peak gradient measures 13.8 mmHg. Aortic valve area, by VTI measures 2.66 cm. There is a 29 mm stented (TAVR) valve present in the aortic  position. Procedure Date: 01/26/2023. Echo findings are consistent with normal structure and function of the aortic valve prosthesis. Additional Comments: Spectral Doppler performed. Color Doppler performed.  LEFT VENTRICLE PLAX 2D LVOT diam:     2.27 cm LV SV:         142 LV SV Index:   75 LVOT Area:     4.05 cm  AORTIC VALVE AV Area (Vmax):    2.60 cm AV Area (Vmean):   2.53 cm AV Area (VTI):     2.66 cm AV Vmax:           186.00 cm/s AV Vmean:          126.000 cm/s AV VTI:            0.533 m AV Peak Grad:      13.8 mmHg AV Mean Grad:      7.0 mmHg LVOT Vmax:         119.48 cm/s LVOT Vmean:        78.848 cm/s LVOT  VTI:          0.350 m LVOT/AV VTI ratio: 0.66  SHUNTS Systemic VTI:  0.35 m Systemic Diam: 2.27 cm Lennie Odor MD Electronically signed by Lennie Odor MD Signature Date/Time: 01/26/2023/12:07:01 PM    Final    Structural Heart Procedure  Result Date: 01/26/2023 See surgical note for result.  CT CORONARY MORPH W/CTA COR W/SCORE W/CA W/CM &/OR WO/CM  Addendum Date: 12/31/2022   ADDENDUM REPORT: 12/31/2022 11:27 CLINICAL DATA:  Aortic stenosis EXAM: Cardiac TAVR CT TECHNIQUE: The patient was scanned on a Siemens Force 192 slice scanner. A 120 kV retrospective scan was triggered in the descending thoracic aorta at 111 HU's. Gantry rotation speed was 270 msecs and collimation was .9 mm. No beta blockade or nitro were given. The 3D data set was reconstructed in 5% intervals of the R-R cycle. Systolic and diastolic phases were analyzed on a dedicated work station using MPR, MIP and VRT modes. The patient received 80 cc of contrast. FINDINGS: Aortic Valve: Tri leaflet calcified with restricted leaflet motion Calcium score 2150 Aorta: No aneurysm mild calcific atherosclerosis Sinotubular Junction: 29 mm Ascending Thoracic Aorta: 35 mm Aortic Arch: Bovine arch 24 mm Descending Thoracic Aorta: 23 mm Sinus of Valsalva Measurements: Non-coronary: 31.2 mm   Height 21.8 mm Right - coronary: 30.5   mm  height 24.4 mm Left - coronary: 30.9 mm   Height 23.5 mm Coronary Artery Height above Annulus: Left Main: 12.7 mm above annulus Right Coronary: 18.2 mm above annulus Virtual Basal Annulus Measurements: Maximum/Minimum Diameter: 25.2 mm x 21.4 mm Average diameter 23.8 mm Perimeter: 76.1 mm Area: 444 mm2 Coronary Arteries: Sufficient height above annulus for deployment Optimum Fluoroscopic Angle for Delivery: LAO 23 Caudal 10 degrees Membranous septal length 7.2 mm IMPRESSION: 1. Calcified tri leaflet AV with calcium score 2150 2. Annular area of 444 mm2 suitable for a 26 mm Sapien 3 valve Alternatively a 29 mm Medtronic  Evolut can be considered 3.  Coronary arteries sufficient height above annulus for deployment 4. Optimum angiographic angle for deployment LAO 23 Caudal 10 degrees 5.  Membranous septal length 7.2 mm Charlton Haws Electronically Signed   By: Charlton Haws M.D.   On: 12/31/2022 11:27   Result Date: 12/31/2022 EXAM: OVER-READ INTERPRETATION  CT CHEST The following report is a limited chest CT over-read performed by radiologist Dr. Allegra Lai of [facility] on 12/31/2022. This over-read does not include interpretation of cardiac  or coronary anatomy or pathology. The cardiac TAVR interpretation by the cardiologist is attached. COMPARISON:  None Available. FINDINGS: Extracardiac findings will be described separately under dictation for contemporaneously obtained CTA chest, abdomen and pelvis. IMPRESSION: Please see separate dictation for contemporaneously obtained CTA chest, abdomen and pelvis dated 12/31/2022 for full description of relevant extracardiac findings. Electronically Signed: By: Allegra Lai M.D. On: 12/31/2022 10:02   CT ANGIO ABDOMEN PELVIS  W & WO CONTRAST  Result Date: 12/31/2022 CLINICAL DATA:  Preop evaluation for aortic valve replacement EXAM: CT ANGIOGRAPHY CHEST, ABDOMEN AND PELVIS TECHNIQUE: Multidetector CT imaging through the chest, abdomen and pelvis was performed using the standard protocol during bolus administration of intravenous contrast. Multiplanar reconstructed images and MIPs were obtained and reviewed to evaluate the vascular anatomy. RADIATION DOSE REDUCTION: This exam was performed according to the departmental dose-optimization program which includes automated exposure control, adjustment of the mA and/or kV according to patient size and/or use of iterative reconstruction technique. CONTRAST:  95mL OMNIPAQUE IOHEXOL 350 MG/ML SOLN COMPARISON:  CT abdomen dated April 24, 2019; CT chest dated June 28, 2017 FINDINGS: CTA CHEST FINDINGS Cardiovascular:  Cardiomegaly. No pericardial effusion. Severe three-vessel coronary artery calcifications. Aortic valve calcifications. Normal caliber thoracic aorta with mild atherosclerotic disease. Mediastinum/Nodes: Esophagus and thyroid are unremarkable. Mildly enlarged mediastinal and bilateral hilar lymph nodes, likely reactive reference right paratracheal lymph node measuring 10 mm on series 6, image 61. Lungs/Pleura: Central airways are patent. New patchy ground-glass opacities with more and small bilateral pleural effusions. More focal area of consolidation is seen in the superior portion of the right upper lobe on series 7, image 33, also new when compared with 2019 prior. Musculoskeletal: No chest wall abnormality. No acute or significant osseous findings. Review of the MIP images confirms the above findings. CTA ABDOMEN AND PELVIS FINDINGS Hepatobiliary: Mildly nodular liver contour. No focal liver abnormality is seen. Status post cholecystectomy. No biliary dilatation. Pancreas: Unremarkable. No pancreatic ductal dilatation or surrounding inflammatory changes. Spleen: Normal in size without focal abnormality. Adrenals/Urinary Tract: bilateral adrenal glands are unremarkable. No hydronephrosis or nephrolithiasis. Indeterminate lesion of the lower pole of the left kidney measuring 13 mm on series 10, image 72, new when compared with prior abdomen CT. Bladder is unremarkable. Stomach/Bowel: Stomach is within normal limits. Appendix appears normal. Diverticulosis. No evidence of bowel wall thickening, distention, or inflammatory changes. Vascular/lymphatic: Normal caliber abdominal aorta with moderate atherosclerotic disease. Incidental note is made of large retroperitoneal portosystemic shunt. Reproductive: Uterus and bilateral adnexa are unremarkable. Other: No abdominal wall hernia or abnormality. No abdominopelvic ascites. Musculoskeletal: Mild compression deformity of L3, likely chronic. No acute or significant  osseous findings. VASCULAR MEASUREMENTS PERTINENT TO TAVR: AORTA: Minimal Aortic Diameter-2.4 mm Severity of Aortic Calcification-moderate RIGHT PELVIS: Right Common Iliac Artery - Minimal Diameter-6.2 mm Tortuosity-mild Calcification-severe Right External Iliac Artery - Minimal Diameter-7.8 mm Tortuosity-mild Calcification-none Right Common Femoral Artery - Minimal Diameter-6.7 mm Tortuosity-none Calcification-mild LEFT PELVIS: Left Common Iliac Artery - Minimal Diameter-6.3 mm Tortuosity-none Calcification-severe Left External Iliac Artery - Minimal Diameter-7.7 mm Tortuosity-moderate Calcification-none Left Common Femoral Artery - Minimal Diameter-6.4 mm Tortuosity-none Calcification-moderate Review of the MIP images confirms the above findings. IMPRESSION: Vascular: 1. Vascular findings and measurements pertinent to potential TAVR procedure, as detailed above. 2. Thickening and calcification of the aortic valve, compatible with reported clinical history of aortic stenosis. 3. Moderate to severe aortoiliac atherosclerosis. Three vessel coronary artery disease. Nonvascular: 1. Patchy ground-glass opacities small bilateral pleural effusions, likely due to pulmonary edema.  2. More focal consolidation is seen in the superior portion of the right lower lobe, could also be due to pulmonary edema, although underlying pulmonary nodule can not be excluded. Recommend follow-up chest CT 3 months to ensure resolution. 3. Cirrhotic liver morphology with sequela of portal hypertension including large retroperitoneal portosystemic shunt. 4. Indeterminate lesion of the lower pole of the left kidney measuring 13 mm, new when compared with prior abdomen CT. Recommend further evaluation with contrast-enhanced renal protocol MRI or CT. Electronically Signed   By: Allegra Lai M.D.   On: 12/31/2022 10:37   CT ANGIO CHEST AORTA W/CM & OR WO/CM  Result Date: 12/31/2022 CLINICAL DATA:  Preop evaluation for aortic valve  replacement EXAM: CT ANGIOGRAPHY CHEST, ABDOMEN AND PELVIS TECHNIQUE: Multidetector CT imaging through the chest, abdomen and pelvis was performed using the standard protocol during bolus administration of intravenous contrast. Multiplanar reconstructed images and MIPs were obtained and reviewed to evaluate the vascular anatomy. RADIATION DOSE REDUCTION: This exam was performed according to the departmental dose-optimization program which includes automated exposure control, adjustment of the mA and/or kV according to patient size and/or use of iterative reconstruction technique. CONTRAST:  95mL OMNIPAQUE IOHEXOL 350 MG/ML SOLN COMPARISON:  CT abdomen dated April 24, 2019; CT chest dated June 28, 2017 FINDINGS: CTA CHEST FINDINGS Cardiovascular: Cardiomegaly. No pericardial effusion. Severe three-vessel coronary artery calcifications. Aortic valve calcifications. Normal caliber thoracic aorta with mild atherosclerotic disease. Mediastinum/Nodes: Esophagus and thyroid are unremarkable. Mildly enlarged mediastinal and bilateral hilar lymph nodes, likely reactive reference right paratracheal lymph node measuring 10 mm on series 6, image 61. Lungs/Pleura: Central airways are patent. New patchy ground-glass opacities with more and small bilateral pleural effusions. More focal area of consolidation is seen in the superior portion of the right upper lobe on series 7, image 33, also new when compared with 2019 prior. Musculoskeletal: No chest wall abnormality. No acute or significant osseous findings. Review of the MIP images confirms the above findings. CTA ABDOMEN AND PELVIS FINDINGS Hepatobiliary: Mildly nodular liver contour. No focal liver abnormality is seen. Status post cholecystectomy. No biliary dilatation. Pancreas: Unremarkable. No pancreatic ductal dilatation or surrounding inflammatory changes. Spleen: Normal in size without focal abnormality. Adrenals/Urinary Tract: bilateral adrenal glands are  unremarkable. No hydronephrosis or nephrolithiasis. Indeterminate lesion of the lower pole of the left kidney measuring 13 mm on series 10, image 72, new when compared with prior abdomen CT. Bladder is unremarkable. Stomach/Bowel: Stomach is within normal limits. Appendix appears normal. Diverticulosis. No evidence of bowel wall thickening, distention, or inflammatory changes. Vascular/lymphatic: Normal caliber abdominal aorta with moderate atherosclerotic disease. Incidental note is made of large retroperitoneal portosystemic shunt. Reproductive: Uterus and bilateral adnexa are unremarkable. Other: No abdominal wall hernia or abnormality. No abdominopelvic ascites. Musculoskeletal: Mild compression deformity of L3, likely chronic. No acute or significant osseous findings. VASCULAR MEASUREMENTS PERTINENT TO TAVR: AORTA: Minimal Aortic Diameter-2.4 mm Severity of Aortic Calcification-moderate RIGHT PELVIS: Right Common Iliac Artery - Minimal Diameter-6.2 mm Tortuosity-mild Calcification-severe Right External Iliac Artery - Minimal Diameter-7.8 mm Tortuosity-mild Calcification-none Right Common Femoral Artery - Minimal Diameter-6.7 mm Tortuosity-none Calcification-mild LEFT PELVIS: Left Common Iliac Artery - Minimal Diameter-6.3 mm Tortuosity-none Calcification-severe Left External Iliac Artery - Minimal Diameter-7.7 mm Tortuosity-moderate Calcification-none Left Common Femoral Artery - Minimal Diameter-6.4 mm Tortuosity-none Calcification-moderate Review of the MIP images confirms the above findings. IMPRESSION: Vascular: 1. Vascular findings and measurements pertinent to potential TAVR procedure, as detailed above. 2. Thickening and calcification of the aortic valve, compatible with reported  clinical history of aortic stenosis. 3. Moderate to severe aortoiliac atherosclerosis. Three vessel coronary artery disease. Nonvascular: 1. Patchy ground-glass opacities small bilateral pleural effusions, likely due to  pulmonary edema. 2. More focal consolidation is seen in the superior portion of the right lower lobe, could also be due to pulmonary edema, although underlying pulmonary nodule can not be excluded. Recommend follow-up chest CT 3 months to ensure resolution. 3. Cirrhotic liver morphology with sequela of portal hypertension including large retroperitoneal portosystemic shunt. 4. Indeterminate lesion of the lower pole of the left kidney measuring 13 mm, new when compared with prior abdomen CT. Recommend further evaluation with contrast-enhanced renal protocol MRI or CT. Electronically Signed   By: Allegra Lai M.D.   On: 12/31/2022 10:37   Disposition   Pt is being discharged home today in good condition.  Follow-up Plans & Appointments     Follow-up Information     Janetta Hora, PA-C. Go on 02/05/2023.   Specialties: Cardiology, Radiology Why: @ 9:50am. Please arrive at least 10 minutes early. Contact information: [address] [place] [address] [phone]                  Discharge Medications   Allergies as of 01/28/2023   No Known Allergies      Medication List     TAKE these medications    ALPRAZolam 0.25 MG tablet Commonly known as: XANAX Take 0.25 mg by mouth at bedtime.   amiodarone 200 MG tablet Commonly known as: PACERONE Take 0.5 tablets (100 mg total) by mouth 2 (two) times daily.   ezetimibe 10 MG tablet Commonly known as: ZETIA Take 10 mg by mouth daily.   Jardiance 25 MG Tabs tablet Generic drug: empagliflozin Take 25 mg by mouth daily.   levothyroxine 88 MCG tablet Commonly known as: SYNTHROID Take 88 mcg by mouth daily before breakfast.   metFORMIN 850 MG tablet Commonly known as: GLUCOPHAGE Take 850 mg by mouth 3 (three) times daily.   metoprolol succinate 25 MG 24 hr tablet Commonly known as: TOPROL-XL Take 1 tablet (25 mg total) by mouth daily. What changed: when to take this   nitroGLYCERIN 0.4 MG SL  tablet Commonly known as: NITROSTAT Place 0.4 mg under the tongue every 5 (five) minutes as needed for chest pain.   prednisoLONE acetate 1 % ophthalmic suspension Commonly known as: PRED FORTE Place 1 drop into the left eye 4 (four) times daily.   raloxifene 60 MG tablet Commonly known as: EVISTA Take 1 tablet by mouth once daily   rivaroxaban 20 MG Tabs tablet Commonly known as: XARELTO Take 20 mg by mouth daily with supper.   torsemide 20 MG tablet Commonly known as: DEMADEX Take 20 mg by mouth 2 (two) times daily.   traMADol 50 MG tablet Commonly known as: ULTRAM Take 50 mg by mouth 2 (two) times daily as needed for moderate pain.          Outstanding Labs/Studies   none  Duration of Discharge Encounter   Greater than 30 minutes including physician time.  Signed, [place] Crock, PA-C 01/28/2023, 9:10 AM [phone]   ATTENDING ATTESTATION:  After conducting a review of all available clinical information with the care team, interviewing the patient, and performing a physical exam, I agree with the findings and plan described in this note.   GEN: No acute distress.   HEENT:  MMM, no JVD, no scleral icterus; R neck dressing Cardiac: RRR, no murmurs,  rubs, or gallops.  Respiratory: Clear to auscultation bilaterally. GI: Soft, nontender, non-distended  MS: No edema; No deformity. Neuro:  Nonfocal  Vasc:  +2 radial pulses; access sites intact with mild bruising  Patient doing well after TAVR with stable access sites, no evidence of stroke, and echocardiogram with good valve function.  She developed transient high-grade heart block immediately after valve deployment.  She was observed for 24 hours with a indwelling right IJ temporary pacemaker in place with no evidence of heart block.  She was back to her baseline rhythm of a first-degree AV block.  The temporary pacemaker was discontinued yesterday and she was observed for another 24 hours with no concerning  rhythm abnormalities.  Will place a ZIO monitor.  Discharge today with follow up scheduled.  Alverda Skeans, MD Pager [phone]

## 2023-01-26 NOTE — Op Note (Signed)
HEART AND VASCULAR CENTER   MULTIDISCIPLINARY HEART VALVE TEAM   TAVR OPERATIVE NOTE   Date of Procedure:                 01/26/2023   Preoperative Diagnosis:      Severe Aortic Stenosis    Postoperative Diagnosis:    Same    Procedure:        Transcatheter Aortic Valve Replacement - Percutaneous Right Transfemoral Approach             Medtronic Evolut FX  (size 29 mm, serial # [id number])              Co-Surgeons:            Alleen Borne, MD and Alverda Skeans, MD     Anesthesiologist:                  Jolyne Loa, MD   Echocardiographer:              Lacretia Nicks. O'Neil, MD  Pre-operative Echo Findings: Severe aortic stenosis  Normal left ventricular systolic function   Post-operative Echo Findings: Trace paravalvular leak Normal left ventricular systolic function  Left ventricular heart catheterization findings: LVEDP of 13 mmHg  Right internal jugular temporary pacemaker placement  BRIEF CLINICAL NOTE AND INDICATIONS FOR SURGERY The patient is a 67 year old female with a history of severe symptomatic aortic stenosis, proximal atrial fibrillation on Xarelto, type 2 diabetes, hypertension, and a BMI of 39 who is referred for elective transcatheter aortic valve replacement with a 29 mm Evolut FX valve from the right transfemoral approach.  During the course of the patient's preoperative work up they have been evaluated comprehensively by a multidisciplinary team of specialists coordinated through the Multidisciplinary Heart Valve Clinic in the [facility].  They have been demonstrated to suffer from symptomatic severe aortic stenosis as noted above. The patient has been counseled extensively as to the relative risks and benefits of all options for the treatment of severe aortic stenosis including long term medical therapy, conventional surgery for aortic valve replacement, and transcatheter aortic valve replacement.  The patient has been independently  evaluated in formal cardiac surgical consultation by Dr Laneta Simmers, who deemed the patient appropriate for TAVR. Based upon review of all of the patient's preoperative diagnostic tests they are felt to be candidate for transcatheter aortic valve replacement using the transfemoral approach as an alternative to conventional surgery.    Following the decision to proceed with transcatheter aortic valve replacement, a discussion has been held regarding what types of management strategies would be attempted intraoperatively in the event of life-threatening complications, including whether or not the patient would be considered a candidate for the use of cardiopulmonary bypass and/or conversion to open sternotomy for attempted surgical intervention.  The patient has been advised of a variety of complications that might develop peculiar to this approach including but not limited to risks of death, stroke, paravalvular leak, aortic dissection or other major vascular complications, aortic annulus rupture, device embolization, cardiac rupture or perforation, acute myocardial infarction, arrhythmia, heart block or bradycardia requiring permanent pacemaker placement, congestive heart failure, respiratory failure, renal failure, pneumonia, infection, other late complications related to structural valve deterioration or migration, or other complications that might ultimately cause a temporary or permanent loss of functional independence or other long term morbidity.  The patient provides full informed consent for the procedure as described and all questions were answered preoperatively.  DETAILS OF THE  OPERATIVE PROCEDURE  PREPARATION:   The patient is brought to the operating room on the above mentioned date and central monitoring was established by the anesthesia team including placement of a radial arterial line. The patient is placed in the supine position on the operating table.  Intravenous antibiotics are administered.  The patient is monitored closely throughout the procedure under conscious sedation.      Baseline transthoracic echocardiogram is performed. The patient's chest, abdomen, both groins, and both lower extremities are prepared and draped in a sterile manner. A time out procedure is performed.   PERIPHERAL ACCESS:   Using ultrasound guidance, femoral arterial and venous access is obtained with placement of 6 Fr sheaths on the left and right sides respectively.  [place] images are captured and digitally stored in the patient's record. A pigtail diagnostic catheter was passed through the femoral arterial sheath under fluoroscopic guidance into the aortic root (non-coronary cusp).     TRANSFEMORAL ACCESS:  A micropuncture technique is used to access the right femoral artery under fluoroscopic and ultrasound guidance.  [place] images are captured and digitally stored in the patient's chart. 2 Perclose devices are deployed at 10' and 2' positions to 'PreClose' the femoral artery. An 8 French sheath is placed and then an Amplatz Superstiff wire is advanced through the sheath. This is changed out for an 18 Fr Dry-Seal sheath after progressively dilating over the Superstiff wire.  An AL-1 catheter was used to direct a straight-tip exchange length wire across the native aortic valve into the left ventricle. This was exchanged out for a pigtail catheter and position was confirmed in the LV apex.  Simultaneous LV and Ao pressures were recorded.  The pigtail catheter was exchanged for a Safari wire in the LV apex.  Direct left ventricular pacing thresholds were assessed and found to be adequate.  BALLOON AORTIC VALVULOPLASTY:  Balloon aortic valvuloplasty with a 22 mm true balloon with direct left ventricular rapid pacing was performed.  TRANSCATHETER HEART VALVE DEPLOYMENT:  A Medtronic Evolut FX transcatheter heart valve (size 29 mm) was prepared and crimped per manufacturer's guidelines, and the proper orientation of the  valve is confirmed on the Medtronic delivery system. The valve was advanced through the introducer sheath and across the aortic arch over the Confida wire until it is positioned at the base of the pigtail catheter in the aortic valve annulus. Using the fine tuning wheel, valve deployment begins until annular contact is made. Controlled ventricular pacing is performed. Once proper position is confirmed via aortic root angiograms in the cusp overlap and LAO projections, the valve is deployed to 80% where it is fully functional. The patient's hemodynamic recovery following valve deployment is good.  Final position is confirmed and the valve is slowly deployed over 30 seconds until both paddles are released. The delivery catheter and Confida wire are removed and the valve is assessed with echocardiography. Echo demostrated acceptable post-procedural gradients, stable mitral valve function, and mild to trace aortic insufficiency.   TEMPORARY PACEMAKER PLACEMENT: After deployment of the valve it looks like the patient was in a stable high-grade heart block.  For this reason ultrasound was used to gain access to the right internal jugular vein and a temporary pacemaker was floated to the right ventricular apex.  The threshold was 0.3 mA.  It was set to a backup rate of 40 bpm with an output of 5 mA.  It was secured in place.  PROCEDURE COMPLETION:  The sheath was removed and femoral  artery closure is performed using the 2 previously deployed Perclose devices.  Protamine is administered once femoral arterial repair was complete. The site is clear with no evidence of bleeding or hematoma after the sutures are tightened. The temporary pacemaker and pigtail catheters are removed.  Perclose is used for contralateral femoral arterial hemostasis for the 6 Fr sheath.  The patient tolerated the procedure well and is transported to the recovery area in stable condition. There were no immediate intraoperative complications.  All sponge instrument and needle counts are verified correct at completion of the operation.   The patient received a total of 120 mL of intravenous contrast during the procedure.   Alverda Skeans, MD @td  @now

## 2023-01-26 NOTE — Anesthesia Postprocedure Evaluation (Signed)
Anesthesia Post Note  Patient: Crystal Ramos  Procedure(s) Performed: Transcatheter Aortic Valve Replacement, Transfemoral (Right) INTRAOPERATIVE TRANSTHORACIC ECHOCARDIOGRAM     Patient location during evaluation: PACU Anesthesia Type: MAC Level of consciousness: awake and alert Pain management: pain level controlled Vital Signs Assessment: post-procedure vital signs reviewed and stable Respiratory status: spontaneous breathing Cardiovascular status: stable Anesthetic complications: no   There were no known notable events for this encounter.  Last Vitals:  Vitals:   01/26/23 1500 01/26/23 1515  BP: (!) 105/46 (!) 98/43  Pulse: (!) 47 (!) 46  Resp: (!) 24 16  Temp:    SpO2: 97% 92%    Last Pain:  Vitals:   01/26/23 1256  TempSrc:   PainSc: Asleep                 Lewie Loron

## 2023-01-26 NOTE — Discharge Instructions (Signed)

## 2023-01-27 ENCOUNTER — Inpatient Hospital Stay (HOSPITAL_COMMUNITY): Payer: PPO

## 2023-01-27 ENCOUNTER — Encounter (HOSPITAL_COMMUNITY): Payer: Self-pay | Admitting: Internal Medicine

## 2023-01-27 DIAGNOSIS — I442 Atrioventricular block, complete: Secondary | ICD-10-CM | POA: Diagnosis not present

## 2023-01-27 DIAGNOSIS — Z952 Presence of prosthetic heart valve: Secondary | ICD-10-CM

## 2023-01-27 DIAGNOSIS — Z006 Encounter for examination for normal comparison and control in clinical research program: Secondary | ICD-10-CM | POA: Diagnosis not present

## 2023-01-27 DIAGNOSIS — I350 Nonrheumatic aortic (valve) stenosis: Secondary | ICD-10-CM | POA: Diagnosis not present

## 2023-01-27 DIAGNOSIS — I5032 Chronic diastolic (congestive) heart failure: Secondary | ICD-10-CM | POA: Diagnosis not present

## 2023-01-27 LAB — BASIC METABOLIC PANEL
Anion gap: 7 (ref 5–15)
BUN: 9 mg/dL (ref 8–23)
CO2: 23 mmol/L (ref 22–32)
Calcium: 8.1 mg/dL — ABNORMAL LOW (ref 8.9–10.3)
Chloride: 106 mmol/L (ref 98–111)
Creatinine, Ser: 0.64 mg/dL (ref 0.44–1.00)
GFR, Estimated: >60 mL/min (ref >60)
Glucose, Bld: 110 mg/dL — ABNORMAL HIGH (ref 70–99)
Potassium: 4.2 mmol/L (ref 3.5–5.1)
Sodium: 136 mmol/L (ref 135–145)

## 2023-01-27 LAB — CBC
HCT: 33.1 % — ABNORMAL LOW (ref 36.0–46.0)
Hemoglobin: 9.7 g/dL — ABNORMAL LOW (ref 12.0–15.0)
MCH: 23.6 pg — ABNORMAL LOW (ref 26.0–34.0)
MCHC: 29.3 g/dL — ABNORMAL LOW (ref 30.0–36.0)
MCV: 80.5 fL (ref 80.0–100.0)
Platelets: UNDETERMINED 10*3/uL (ref 150–400)
RBC: 4.11 MIL/uL (ref 3.87–5.11)
RDW: 15.8 % — ABNORMAL HIGH (ref 11.5–15.5)
WBC: 4.3 10*3/uL (ref 4.0–10.5)
nRBC: 0 % (ref 0.0–0.2)

## 2023-01-27 LAB — GLUCOSE, CAPILLARY
Glucose-Capillary: 141 mg/dL — ABNORMAL HIGH (ref 70–99)
Glucose-Capillary: 162 mg/dL — ABNORMAL HIGH (ref 70–99)
Glucose-Capillary: 182 mg/dL — ABNORMAL HIGH (ref 70–99)
Glucose-Capillary: 95 mg/dL (ref 70–99)

## 2023-01-27 LAB — MAGNESIUM: Magnesium: 2 mg/dL (ref 1.7–2.4)

## 2023-01-27 MED ORDER — PERFLUTREN LIPID MICROSPHERE
1.0000 mL | INTRAVENOUS | Status: AC | PRN
  Administered 2023-01-27: 4 mL via INTRAVENOUS

## 2023-01-27 MED ORDER — RIVAROXABAN 20 MG PO TABS
20.0000 mg | ORAL_TABLET | Freq: Every day | ORAL | Status: DC
  Administered 2023-01-27: 20 mg via ORAL
  Filled 2023-01-27 (×2): qty 1

## 2023-01-27 MED ORDER — TORSEMIDE 20 MG PO TABS
20.0000 mg | ORAL_TABLET | Freq: Two times a day (BID) | ORAL | Status: DC
  Administered 2023-01-27 – 2023-01-28 (×3): 20 mg via ORAL
  Filled 2023-01-27 (×3): qty 1

## 2023-01-27 NOTE — Progress Notes (Addendum)
HEART AND VASCULAR CENTER   MULTIDISCIPLINARY HEART VALVE TEAM  Patient Name: Crystal Ramos Date of Encounter: 01/27/2023  Admit date: 01/26/2023  Primary Care Provider: Paulina Fusi, MD [facility] Cardiologist: Garwin Brothers, MD  [facility] Electrophysiologist:  None   Hospital Problem List     Principal Problem:   S/P TAVR (transcatheter aortic valve replacement) Active Problems:   Type 2 diabetes mellitus without complication (HCC)   Essential hypertension   Dyslipidemia   Morbid obesity (HCC)   Paroxysmal atrial fibrillation (HCC)   Chronic diastolic heart failure (HCC)   Diabetes mellitus due to underlying condition with unspecified complications (HCC)   Coronary artery disease involving native coronary artery of native heart without angina pectoris   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   Inpatient Medications    Scheduled Meds:  ALPRAZolam  0.25 mg Oral QHS   amiodarone  100 mg Oral BID   Chlorhexidine Gluconate Cloth  6 each Topical Daily   empagliflozin  25 mg Oral Daily   ezetimibe  10 mg Oral Daily   insulin aspart  0-24 Units Subcutaneous TID AC & HS   levothyroxine  88 mcg Oral QAC breakfast   prednisoLONE acetate  1 drop Left Eye QID   raloxifene  60 mg Oral Daily   rivaroxaban  20 mg Oral Q supper   sodium chloride flush  3 mL Intravenous Q12H   torsemide  20 mg Oral BID   Continuous Infusions:  sodium chloride     sodium chloride 10 mL/hr at 01/27/23 0700   nitroGLYCERIN     norepinephrine (LEVOPHED) Adult infusion Stopped (01/26/23 1458)   PRN Meds: sodium chloride, acetaminophen **OR** acetaminophen, morphine injection, ondansetron (ZOFRAN) IV, mouth rinse, oxyCODONE, sodium chloride flush, traMADol   Vital Signs    Vitals:   01/27/23 0515 01/27/23 0530  01/27/23 0600 01/27/23 0700  BP:  (!) 124/53 (!) 117/45 (!) 127/49  Pulse: 66 67 67 61  Resp: (!) 25 (!) 25 (!) 27 (!) 25  Temp:      TempSrc:      SpO2: 95% 94% 96% 95%  Weight:      Height:        Intake/Output Summary (Last 24 hours) at 01/27/2023 0912 Last data filed at 01/27/2023 0700 Gross per 24 hour  Intake 1505.09 ml  Output 5 ml  Net 1500.09 ml   Filed Weights   01/26/23 0556 01/27/23 0500  Weight: 94.3 kg 96.5 kg    Physical Exam    GEN: Well nourished, well developed, in no acute distress. obese HEENT: Grossly normal.  Neck: Supple, no JVD, carotid bruits, or masses. Cardiac: RRR, no murmurs, rubs, or gallops. No clubbing, cyanosis, edema.   Respiratory:  Respirations regular and unlabored, clear to auscultation bilaterally. GI: Soft, nontender, nondistended, BS + x 4. MS: no deformity or atrophy. Skin: warm and dry, no rash.  Groin sites clear without hematoma or ecchymosis Neuro:  Strength and sensation are intact. Psych: AAOx3.  Normal affect.  Labs    CBC Recent Labs    01/26/23 0937 01/27/23 0305  WBC  --  4.3  HGB 10.5* 9.7*  HCT 31.0* 33.1*  MCV  --  80.5  PLT  --  PLATELET CLUMPS NOTED ON SMEAR, UNABLE TO ESTIMATE  Basic Metabolic Panel Recent Labs    96/29/52 0937 01/27/23 0305  NA 140 136  K 4.0 4.2  CL 102 106  CO2  --  23  GLUCOSE 160* 110*  BUN 10 9  CREATININE 0.80 0.64  CALCIUM  --  8.1*  MG  --  2.0   Liver Function Tests No results for input(s): "AST", "ALT", "ALKPHOS", "BILITOT", "PROT", "ALBUMIN" in the last 72 hours. No results for input(s): "LIPASE", "AMYLASE" in the last 72 hours. Cardiac Enzymes No results for input(s): "CKTOTAL", "CKMB", "CKMBINDEX", "TROPONINI" in the last 72 hours. BNP Invalid input(s): "POCBNP" D-Dimer No results for input(s): "DDIMER" in the last 72 hours. Hemoglobin A1C No results for input(s): "HGBA1C" in the last 72 hours. Fasting Lipid Panel No results for input(s): "CHOL", "HDL",  "LDLCALC", "TRIG", "CHOLHDL", "LDLDIRECT" in the last 72 hours. Thyroid Function Tests No results for input(s): "TSH", "T4TOTAL", "T3FREE", "THYROIDAB" in the last 72 hours.  Invalid input(s): "FREET3"  Telemetry    Sinus with PVCs - Personally Reviewed  ECG    Sinus with 1st deg AV block and new RBBB - Personally Reviewed  Radiology    ECHOCARDIOGRAM LIMITED  Result Date: 01/26/2023    ECHOCARDIOGRAM LIMITED REPORT   Patient Name:   Crystal Ramos Date of Exam: 01/26/2023 Medical Rec #:  [id number]            Height:       60.0 in Accession #:    [id number]           Weight:       208.0 lb Date of Birth:  [birth year]            BSA:          1.898 m Patient Age:    67 years             BP:           143/78 mmHg Patient Gender: F                    HR:           41 bpm. Exam Location:  Inpatient Procedure: Limited Echo, Color Doppler and Cardiac Doppler Indications:     TAVR implantation  History:         Patient has prior history of Echocardiogram examinations, most                  recent 11/26/2022. CHF, Breast Cancer, Aortic Valve Disease and                  s/p TAVR with a 29 mm Evolut FX, Arrythmias:Atrial                  Fibrillation, Signs/Symptoms:Chest Pain; Risk                  Factors:Hypertension and Diabetes.                  Aortic Valve: 29 mm stented (TAVR) valve is present in the                  aortic position. Procedure Date: 01/26/2023.  Sonographer:     Milbert Coulter Referring Phys:  [id number] Orbie Pyo Diagnosing Phys: Lennie Odor MD IMPRESSIONS  1. Echo guided TAVR. 29 mm Evolut Pro. Trivial paravalvular leak. Vmax 1.8 m/s, MG 7 mmHG, EOA 2.66 cm2, DI 0.66. Normal prosthesis. The aortic valve has  been repaired/replaced. Aortic valve regurgitation is trivial. There is a 29 mm stented (TAVR) valve present in the aortic position. Procedure Date: 01/26/2023. Echo findings are consistent with normal structure and function of the aortic valve prosthesis.  2. Left  ventricular ejection fraction, by estimation, is 55 to 60%. The left ventricle has normal function.  3. Right ventricular systolic function is mildly reduced. The right ventricular size is normal. FINDINGS  Left Ventricle: Left ventricular ejection fraction, by estimation, is 55 to 60%. The left ventricle has normal function. Right Ventricle: The right ventricular size is normal. No increase in right ventricular wall thickness. Right ventricular systolic function is mildly reduced. Pericardium: There is no evidence of pericardial effusion. Aortic Valve: Echo guided TAVR. 29 mm Evolut Pro. Trivial paravalvular leak. Vmax 1.8 m/s, MG 7 mmHG, EOA 2.66 cm2, DI 0.66. Normal prosthesis. The aortic valve has been repaired/replaced. Aortic valve regurgitation is trivial. Aortic valve mean gradient  measures 7.0 mmHg. Aortic valve peak gradient measures 13.8 mmHg. Aortic valve area, by VTI measures 2.66 cm. There is a 29 mm stented (TAVR) valve present in the aortic position. Procedure Date: 01/26/2023. Echo findings are consistent with normal structure and function of the aortic valve prosthesis. Additional Comments: Spectral Doppler performed. Color Doppler performed.  LEFT VENTRICLE PLAX 2D LVOT diam:     2.27 cm LV SV:         142 LV SV Index:   75 LVOT Area:     4.05 cm  AORTIC VALVE AV Area (Vmax):    2.60 cm AV Area (Vmean):   2.53 cm AV Area (VTI):     2.66 cm AV Vmax:           186.00 cm/s AV Vmean:          126.000 cm/s AV VTI:            0.533 m AV Peak Grad:      13.8 mmHg AV Mean Grad:      7.0 mmHg LVOT Vmax:         119.48 cm/s LVOT Vmean:        78.848 cm/s LVOT VTI:          0.350 m LVOT/AV VTI ratio: 0.66  SHUNTS Systemic VTI:  0.35 m Systemic Diam: 2.27 cm Lennie Odor MD Electronically signed by Lennie Odor MD Signature Date/Time: 01/26/2023/12:07:01 PM    Final    Structural Heart Procedure  Result Date: 01/26/2023 See surgical note for result.   Cardiac Studies   TAVR OPERATIVE NOTE      Date of Procedure:                 01/26/2023   Preoperative Diagnosis:      Severe Aortic Stenosis    Postoperative Diagnosis:    Same    Procedure:        Transcatheter Aortic Valve Replacement - Percutaneous Right Transfemoral Approach             Medtronic Evolut FX  (size 29 mm, serial # [id number])              Co-Surgeons:            Alleen Borne, MD and Alverda Skeans, MD     Anesthesiologist:                  Jolyne Loa, MD   Echocardiographer:              W.  O'Neil, MD   Pre-operative Echo Findings: Severe aortic stenosis  Normal left ventricular systolic function   Post-operative Echo Findings: Trace paravalvular leak Normal left ventricular systolic function   Left ventricular heart catheterization findings: LVEDP of 13 mmHg   Right internal jugular temporary pacemaker placement   _____________     Echo 01/27/23: completed but pending formal read at the time of discharge  Patient Profile     Jrue Pataki is a 67 y.o. female with a history of HTN, HLD, DMT2, chronic HFpEF, morbid obesity (BMI 39), chronic diastolic CHF, 1st deg AV block, PAF on Xarelto and severe AS who presented to [facility] on 01/26/23 for planned TAVR.   Assessment & Plan    Severe AS: s/p successful TAVR with a 29 Evolut FX THV via the TF approach on 01/26/23. Post operative echo completed but pending formal read. Groin sites are stable. Will resume on home Xarelto 20mg  daily.   CHB: had an underlying 1st degree AV block prior to TAVR. She developed CHB after valve deployment. Internal jugular temp wire was left in place. She is now conducting with her intrinsic rhythm with 1st deg AV block and new RBBB, HR in 60s. Discussed with EP who feels there is no indication for PPM at this time. Will keep temp wire in for now, transfer to 4E and discharge home tomorrow with a Zio AT if no further conduction issues.    DMT2: continue SSI    HTN: BP well controlled.    PAF: resumed home  amiodarone and Xarelto. Toprol XL25mg  BID on hold given CHB.    HFpEF: she is euvolemic. LVEDP 13 at the time of TAVR. Resumed on home torsemide   Signed, Cline Crock, PA-C  01/27/2023, 9:12 AM  Pager [phone]   ATTENDING ATTESTATION:  After conducting a review of all available clinical information with the care team, interviewing the patient, and performing a physical exam, I agree with the findings and plan described in this note.   GEN: No acute distress.   HEENT:  MMM, no JVD, no scleral icterus; RIJ temp wire in place Cardiac: RRR, no murmurs, rubs, or gallops.  Respiratory: Clear to auscultation bilaterally. GI: Soft, nontender, non-distended  MS: No edema; No deformity. Neuro:  Nonfocal  Vasc:  +2 radial pulses; mild bruising bilateral groins  Patient is doing well after TAVR complicated by transient high-grade heart block.  She seems to be in normal sinus rhythm with her baseline first-degree AV block now with no recent pacing.  Her heart rate is up to 60s.  Her EKG today demonstrates sinus rhythm with first-degree AV block and what looks to be a right bundle branch block and possibly a left bundle branch block.  Given these findings we will reach out to EP regarding management which will inform next steps.  Will follow-up echocardiogram today.  Alverda Skeans, MD Pager [phone]

## 2023-01-27 NOTE — Progress Notes (Signed)
Echocardiogram 2D Echocardiogram has been performed.  Warren Lacy Bastian Andreoli RDCS 01/27/2023, 10:08 AM

## 2023-01-27 NOTE — TOC CM/SW Note (Signed)
Transition of Care [facility]) - Inpatient Brief Assessment   Patient Details  Name: Crystal Ramos MRN: [id number] Date of Birth: [birth year]  Transition of [facility] CM/SW Contact:    Gala Lewandowsky, RN Phone Number: 01/27/2023, 11:26 AM   Clinical Narrative: Patient POD-1 TAVR. PTA patient was from home with spouse and daughter. Patient reports that she has a cane and rolling walker in the home. Patient has Insurance and PCP; gets to appointments without any issues. No further needs identified at this visit. Case Manager will continue to follow for additional transition of care needs.    Transition of Care Asessment: Insurance and Status: Insurance coverage has been reviewed Patient has primary care physician: Yes Home environment has been reviewed: reviewed- from home with spouse and daughter. Prior level of function:: independent Prior/Current Home Services: No current home services Social Determinants of Health Reivew: SDOH reviewed no interventions necessary Readmission risk has been reviewed: Yes Transition of care needs: no transition of care needs at this time

## 2023-01-28 ENCOUNTER — Inpatient Hospital Stay (HOSPITAL_COMMUNITY)
Admit: 2023-01-28 | Discharge: 2023-01-28 | Disposition: A | Discharge status: Home / Self Care | Payer: PPO | Attending: Physician Assistant | Admitting: Physician Assistant

## 2023-01-28 DIAGNOSIS — I480 Paroxysmal atrial fibrillation: Secondary | ICD-10-CM | POA: Diagnosis not present

## 2023-01-28 DIAGNOSIS — Z952 Presence of prosthetic heart valve: Secondary | ICD-10-CM

## 2023-01-28 DIAGNOSIS — Z006 Encounter for examination for normal comparison and control in clinical research program: Secondary | ICD-10-CM | POA: Diagnosis not present

## 2023-01-28 DIAGNOSIS — I5032 Chronic diastolic (congestive) heart failure: Secondary | ICD-10-CM | POA: Diagnosis not present

## 2023-01-28 DIAGNOSIS — I350 Nonrheumatic aortic (valve) stenosis: Secondary | ICD-10-CM | POA: Diagnosis not present

## 2023-01-28 DIAGNOSIS — I442 Atrioventricular block, complete: Secondary | ICD-10-CM | POA: Diagnosis not present

## 2023-01-28 LAB — BASIC METABOLIC PANEL
Anion gap: 10 (ref 5–15)
BUN: 9 mg/dL (ref 8–23)
CO2: 25 mmol/L (ref 22–32)
Calcium: 7.8 mg/dL — ABNORMAL LOW (ref 8.9–10.3)
Chloride: 104 mmol/L (ref 98–111)
Creatinine, Ser: 0.85 mg/dL (ref 0.44–1.00)
GFR, Estimated: >60 mL/min (ref >60)
Glucose, Bld: 117 mg/dL — ABNORMAL HIGH (ref 70–99)
Potassium: 3.2 mmol/L — ABNORMAL LOW (ref 3.5–5.1)
Sodium: 139 mmol/L (ref 135–145)

## 2023-01-28 LAB — CBC
HCT: 32.4 % — ABNORMAL LOW (ref 36.0–46.0)
Hemoglobin: 9.7 g/dL — ABNORMAL LOW (ref 12.0–15.0)
MCH: 23.8 pg — ABNORMAL LOW (ref 26.0–34.0)
MCHC: 29.9 g/dL — ABNORMAL LOW (ref 30.0–36.0)
MCV: 79.6 fL — ABNORMAL LOW (ref 80.0–100.0)
Platelets: 83 10*3/uL — ABNORMAL LOW (ref 150–400)
RBC: 4.07 MIL/uL (ref 3.87–5.11)
RDW: 16.1 % — ABNORMAL HIGH (ref 11.5–15.5)
WBC: 4.9 10*3/uL (ref 4.0–10.5)
nRBC: 0 % (ref 0.0–0.2)

## 2023-01-28 LAB — GLUCOSE, CAPILLARY: Glucose-Capillary: 128 mg/dL — ABNORMAL HIGH (ref 70–99)

## 2023-01-28 MED ORDER — METOPROLOL SUCCINATE ER 25 MG PO TB24: 25.0000 mg | ORAL_TABLET | Freq: Every day | ORAL | 3 refills | Status: DC

## 2023-01-28 MED ORDER — POTASSIUM CHLORIDE CRYS ER 20 MEQ PO TBCR: 40.0000 meq | EXTENDED_RELEASE_TABLET | Freq: Once | ORAL | Status: DC

## 2023-01-28 NOTE — Progress Notes (Signed)
Patient given discharge instructions, medicaiton list and follow up appointments. IV and tele were dcd. Will discharge home as ordered. Transported to exit via wheel chair and nursing staff. Gerik Coberly, Randall An RN

## 2023-01-28 NOTE — Progress Notes (Addendum)
CARDIAC REHAB PHASE I   PRE:  Rate/Rhythm: 70 NSR  BP:  Sitting: 137/66      SaO2: 98 RA  MODE:  Ambulation: 180 ft   AD:   RW  POST:  Rate/Rhythm: 83 NSR  BP:  Sitting: 141/76      SaO2: 95 RA  Pt amb with standby assistance, pt experienced mild SOB that resolved with rest. Pt reports this is the farthest she's walked in a while. I will send a referral to [facility] for CR so pt can continue to extend her walking distance. Pt was educated on restrictions, HH diet, and importance of exercise.  Faustino Congress  ACSM-CEP 9:52 AM 01/28/2023    Service time is from 0927 to [phone].

## 2023-01-29 ENCOUNTER — Other Ambulatory Visit: Payer: Self-pay | Admitting: Cardiology

## 2023-01-29 ENCOUNTER — Telehealth: Payer: Self-pay | Admitting: Physician Assistant

## 2023-01-29 ENCOUNTER — Telehealth: Payer: Self-pay | Admitting: Internal Medicine

## 2023-01-29 ENCOUNTER — Telehealth: Payer: Self-pay | Admitting: Home Health

## 2023-01-29 DIAGNOSIS — I480 Paroxysmal atrial fibrillation: Secondary | ICD-10-CM | POA: Diagnosis not present

## 2023-01-29 DIAGNOSIS — Z952 Presence of prosthetic heart valve: Secondary | ICD-10-CM | POA: Diagnosis not present

## 2023-01-29 LAB — ECHOCARDIOGRAM COMPLETE
AR max vel: 1.73 cm2
AV Area VTI: 1.67 cm2
AV Area mean vel: 1.82 cm2
AV Mean grad: 16.3 mmHg
AV Peak grad: 28.2 mmHg
Ao pk vel: 2.66 m/s
Area-P 1/2: 3.83 cm2
Height: 60 in
MV VTI: 2.63 cm2
S' Lateral: 3.5 cm
Weight: 3403.9 [oz_av]

## 2023-01-29 NOTE — Telephone Encounter (Signed)
  HEART AND VASCULAR CENTER   MULTIDISCIPLINARY HEART VALVE TEAM   Patient contacted regarding discharge from [facility] on 9/12  Patient understands to follow up with a structural heart APP on 9/18 at [facility].  Patient understands discharge instructions? yes Patient understands medications and regimen? yes Patient understands to bring all medications to this visit? yes  Cline Crock PA-C  [facility]

## 2023-01-29 NOTE — Telephone Encounter (Signed)
I received a call from the heart rhythm monitoring services noting that the patient was having multiple episodes of pauses.  The longest was 11 to 12 seconds.  She was symptomatic with this where she had a near syncopal event.  I instructed the patient to come to the emergency department immediately for evaluation.  Noted that she received the monitor post TAVR given that she had transient CHB at that time.  She stated understanding and noted that she was coming to the emergency department as soon as possible.  Gerre Scull, MD Cardiology

## 2023-01-29 NOTE — Telephone Encounter (Signed)
Patient called after hour line, reporting she is lightheaded, lost consciousness, had spasms. Called the patient back, the person who answered her phone states EMS are here and she blacked on while on the phone. Advised the patient to be transferred to nearest ER for in person evaluation.

## 2023-01-30 ENCOUNTER — Other Ambulatory Visit: Payer: Self-pay

## 2023-01-30 ENCOUNTER — Emergency Department (HOSPITAL_COMMUNITY): Payer: PPO

## 2023-01-30 ENCOUNTER — Observation Stay (HOSPITAL_COMMUNITY)
Admission: EM | Admit: 2023-01-30 | Discharge: 2023-01-31 | Disposition: A | Discharge status: Home / Self Care | Payer: PPO | Attending: Cardiology | Admitting: Cardiology

## 2023-01-30 ENCOUNTER — Encounter (HOSPITAL_COMMUNITY): Payer: Self-pay | Admitting: *Deleted

## 2023-01-30 DIAGNOSIS — I441 Atrioventricular block, second degree: Secondary | ICD-10-CM

## 2023-01-30 DIAGNOSIS — I5032 Chronic diastolic (congestive) heart failure: Secondary | ICD-10-CM | POA: Diagnosis present

## 2023-01-30 DIAGNOSIS — R55 Syncope and collapse: Secondary | ICD-10-CM | POA: Diagnosis not present

## 2023-01-30 DIAGNOSIS — Z952 Presence of prosthetic heart valve: Secondary | ICD-10-CM

## 2023-01-30 DIAGNOSIS — E119 Type 2 diabetes mellitus without complications: Secondary | ICD-10-CM | POA: Insufficient documentation

## 2023-01-30 DIAGNOSIS — I110 Hypertensive heart disease with heart failure: Secondary | ICD-10-CM | POA: Diagnosis not present

## 2023-01-30 DIAGNOSIS — I442 Atrioventricular block, complete: Secondary | ICD-10-CM | POA: Diagnosis not present

## 2023-01-30 DIAGNOSIS — R0989 Other specified symptoms and signs involving the circulatory and respiratory systems: Secondary | ICD-10-CM | POA: Diagnosis not present

## 2023-01-30 DIAGNOSIS — I509 Heart failure, unspecified: Secondary | ICD-10-CM | POA: Diagnosis not present

## 2023-01-30 DIAGNOSIS — Z7984 Long term (current) use of oral hypoglycemic drugs: Secondary | ICD-10-CM | POA: Diagnosis not present

## 2023-01-30 DIAGNOSIS — E876 Hypokalemia: Secondary | ICD-10-CM | POA: Diagnosis not present

## 2023-01-30 DIAGNOSIS — I350 Nonrheumatic aortic (valve) stenosis: Secondary | ICD-10-CM

## 2023-01-30 DIAGNOSIS — I480 Paroxysmal atrial fibrillation: Secondary | ICD-10-CM | POA: Diagnosis present

## 2023-01-30 DIAGNOSIS — Z79899 Other long term (current) drug therapy: Secondary | ICD-10-CM | POA: Diagnosis not present

## 2023-01-30 DIAGNOSIS — Z7982 Long term (current) use of aspirin: Secondary | ICD-10-CM | POA: Insufficient documentation

## 2023-01-30 DIAGNOSIS — Z7901 Long term (current) use of anticoagulants: Secondary | ICD-10-CM | POA: Diagnosis not present

## 2023-01-30 DIAGNOSIS — R7989 Other specified abnormal findings of blood chemistry: Secondary | ICD-10-CM | POA: Insufficient documentation

## 2023-01-30 DIAGNOSIS — I455 Other specified heart block: Secondary | ICD-10-CM | POA: Diagnosis present

## 2023-01-30 DIAGNOSIS — I2510 Atherosclerotic heart disease of native coronary artery without angina pectoris: Secondary | ICD-10-CM | POA: Diagnosis not present

## 2023-01-30 DIAGNOSIS — I517 Cardiomegaly: Secondary | ICD-10-CM | POA: Diagnosis not present

## 2023-01-30 HISTORY — DX: Other specified heart block: I45.5

## 2023-01-30 LAB — SURGICAL PCR SCREEN
MRSA, PCR: NEGATIVE
Staphylococcus aureus: NEGATIVE

## 2023-01-30 LAB — BASIC METABOLIC PANEL
Anion gap: 11 (ref 5–15)
BUN: 11 mg/dL (ref 8–23)
CO2: 23 mmol/L (ref 22–32)
Calcium: 8.5 mg/dL — ABNORMAL LOW (ref 8.9–10.3)
Chloride: 104 mmol/L (ref 98–111)
Creatinine, Ser: 1.07 mg/dL — ABNORMAL HIGH (ref 0.44–1.00)
GFR, Estimated: 56 mL/min — ABNORMAL LOW (ref >60)
Glucose, Bld: 160 mg/dL — ABNORMAL HIGH (ref 70–99)
Potassium: 3.4 mmol/L — ABNORMAL LOW (ref 3.5–5.1)
Sodium: 138 mmol/L (ref 135–145)

## 2023-01-30 LAB — CBC
HCT: 36 % (ref 36.0–46.0)
Hemoglobin: 10.5 g/dL — ABNORMAL LOW (ref 12.0–15.0)
MCH: 23.6 pg — ABNORMAL LOW (ref 26.0–34.0)
MCHC: 29.2 g/dL — ABNORMAL LOW (ref 30.0–36.0)
MCV: 81.1 fL (ref 80.0–100.0)
Platelets: 114 10*3/uL — ABNORMAL LOW (ref 150–400)
RBC: 4.44 MIL/uL (ref 3.87–5.11)
RDW: 16.2 % — ABNORMAL HIGH (ref 11.5–15.5)
WBC: 6.2 10*3/uL (ref 4.0–10.5)
nRBC: 0 % (ref 0.0–0.2)

## 2023-01-30 LAB — TROPONIN I (HIGH SENSITIVITY)
Troponin I (High Sensitivity): 245 ng/L (ref <18)
Troponin I (High Sensitivity): 203 ng/L (ref <18)

## 2023-01-30 LAB — MAGNESIUM: Magnesium: 2.1 mg/dL (ref 1.7–2.4)

## 2023-01-30 LAB — CBG MONITORING, ED: Glucose-Capillary: 155 mg/dL — ABNORMAL HIGH (ref 70–99)

## 2023-01-30 MED ORDER — EMPAGLIFLOZIN 25 MG PO TABS
25.0000 mg | ORAL_TABLET | Freq: Every day | ORAL | Status: DC
  Filled 2023-01-30 (×2): qty 1

## 2023-01-30 MED ORDER — SODIUM CHLORIDE 0.9 % IV SOLN: INTRAVENOUS | Status: DC

## 2023-01-30 MED ORDER — RALOXIFENE HCL 60 MG PO TABS
60.0000 mg | ORAL_TABLET | Freq: Every day | ORAL | Status: DC
  Filled 2023-01-30 (×2): qty 1

## 2023-01-30 MED ORDER — SODIUM CHLORIDE 0.9 % IV SOLN
80.0000 mg | INTRAVENOUS | Status: AC
  Administered 2023-01-31: 80 mg
  Filled 2023-01-30: qty 2

## 2023-01-30 MED ORDER — POTASSIUM CHLORIDE CRYS ER 20 MEQ PO TBCR
20.0000 meq | EXTENDED_RELEASE_TABLET | Freq: Once | ORAL | Status: AC
  Administered 2023-01-30: 20 meq via ORAL
  Filled 2023-01-30: qty 1

## 2023-01-30 MED ORDER — PREDNISOLONE ACETATE 1 % OP SUSP
1.0000 [drp] | Freq: Four times a day (QID) | OPHTHALMIC | Status: DC
  Administered 2023-01-30 (×2): 1 [drp] via OPHTHALMIC
  Filled 2023-01-30 (×2): qty 5

## 2023-01-30 MED ORDER — TORSEMIDE 20 MG PO TABS
20.0000 mg | ORAL_TABLET | Freq: Two times a day (BID) | ORAL | Status: DC
  Administered 2023-01-30 – 2023-01-31 (×2): 20 mg via ORAL
  Filled 2023-01-30 (×2): qty 1

## 2023-01-30 MED ORDER — EZETIMIBE 10 MG PO TABS
10.0000 mg | ORAL_TABLET | Freq: Every day | ORAL | Status: DC
  Filled 2023-01-30: qty 1

## 2023-01-30 MED ORDER — METFORMIN HCL 850 MG PO TABS
850.0000 mg | ORAL_TABLET | Freq: Three times a day (TID) | ORAL | Status: DC
  Administered 2023-01-30 – 2023-01-31 (×2): 850 mg via ORAL
  Filled 2023-01-30 (×5): qty 1

## 2023-01-30 MED ORDER — AMIODARONE HCL 100 MG PO TABS
100.0000 mg | ORAL_TABLET | Freq: Two times a day (BID) | ORAL | Status: DC
  Administered 2023-01-30: 100 mg via ORAL
  Filled 2023-01-30 (×2): qty 1

## 2023-01-30 MED ORDER — CEFAZOLIN SODIUM-DEXTROSE 2-4 GM/100ML-% IV SOLN: 2.0000 g | INTRAVENOUS | Status: DC

## 2023-01-30 MED ORDER — CHLORHEXIDINE GLUCONATE 4 % EX SOLN
4.0000 | Freq: Once | CUTANEOUS | Status: AC
  Administered 2023-01-31: 4 via TOPICAL
  Filled 2023-01-30: qty 15

## 2023-01-30 MED ORDER — SODIUM CHLORIDE 0.9 % IV SOLN
80.0000 mg | INTRAVENOUS | Status: DC
  Filled 2023-01-30: qty 2

## 2023-01-30 MED ORDER — CEFAZOLIN SODIUM-DEXTROSE 2-4 GM/100ML-% IV SOLN
2.0000 g | INTRAVENOUS | Status: AC
  Administered 2023-01-31: 2 g via INTRAVENOUS

## 2023-01-30 MED ORDER — LEVOTHYROXINE SODIUM 88 MCG PO TABS
88.0000 ug | ORAL_TABLET | Freq: Every day | ORAL | Status: DC
  Filled 2023-01-30: qty 1

## 2023-01-30 MED ORDER — ALPRAZOLAM 0.25 MG PO TABS
0.2500 mg | ORAL_TABLET | Freq: Every day | ORAL | Status: DC
  Administered 2023-01-30: 0.25 mg via ORAL
  Filled 2023-01-30: qty 1

## 2023-01-30 NOTE — H&P (Signed)
Cardiology Admission History and Physical   Patient ID: Crystal Ramos MRN: [id number]; DOB: 03-Jan-1956   Admission date: 01/30/2023  PCP:  Paulina Fusi, MD   [facility] Providers Cardiologist:  Garwin Brothers, MD        Chief Complaint:  sinus pause  Patient Profile:   Crystal Ramos is a 67 y.o. female with hypertension, hyperlipidemia, type 2 diabetes, chronic HFpEF, paroxysmal atrial fibrillation on Xarelto, and severe AAS status post recent TAVR who is being seen 01/30/2023 for the evaluation of sinus pauses.  History of Present Illness:   Crystal Ramos is a 67 year old female with a history of above who was discharged on 01/28/2019 for post TAVR.  During her hospitalization for the TAVR she was noted to have intermittent heart block.  At that time it was decided that she did not need pacemaker and she was sent home with a monitor.  She was not having some symptoms until 5 PM yesterday when she was on the phone with her grandson and transiently lost consciousness.  I was notified by the remote monitoring service that she had a 12-second pause.  She had had at least 4-5 episodes that were greater than 10 seconds.  Thus I called her to come to the emergency department immediately for admission.  She reports no other symptoms and is hemodynamically stable at this time.   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 Thukkani and Bartle   Severe aortic stenosis 12/02/2022   Type 2 diabetes mellitus without complication (HCC) 12/18/2015    Past Surgical History:  Procedure Laterality Date   CHOLECYSTECTOMY     INTRAOPERATIVE TRANSTHORACIC ECHOCARDIOGRAM  01/26/2023   Procedure: INTRAOPERATIVE TRANSTHORACIC ECHOCARDIOGRAM;  Surgeon: Orbie Pyo, MD;  [facility];  Service: Open Heart Surgery;;   LEFT HEART CATH AND CORONARY ANGIOGRAPHY N/A 04/03/2019   Procedure: LEFT HEART CATH AND CORONARY ANGIOGRAPHY;  Surgeon: Kathleene Hazel, MD;  [facility];  Service: Cardiovascular;  Laterality: N/A;   MEDIAL PARTIAL KNEE REPLACEMENT     RIGHT/LEFT HEART CATH AND CORONARY ANGIOGRAPHY N/A 12/07/2022   Procedure: RIGHT/LEFT HEART CATH AND CORONARY ANGIOGRAPHY;  Surgeon: Yvonne Kendall, MD;  [facility];  Service: Cardiovascular;  Laterality: N/A;   TRANSCATHETER AORTIC VALVE REPLACEMENT, TRANSFEMORAL Right 01/26/2023   Procedure: Transcatheter Aortic Valve Replacement, Transfemoral;  Surgeon: Orbie Pyo, MD;  [facility];  Service: Open Heart Surgery;  Laterality: Right;     Medications Prior to Admission: Prior to Admission medications   Medication Sig Start Date End Date Taking? Authorizing Provider  ALPRAZolam Prudy Feeler) 0.25 MG tablet Take 0.25  mg by mouth at bedtime.  07/26/18   [provider]  amiodarone (PACERONE) 200 MG tablet Take 0.5 tablets (100 mg total) by mouth 2 (two) times daily. 06/29/22   Revankar, Aundra Dubin, MD  ezetimibe (ZETIA) 10 MG tablet Take 1 tablet by mouth once daily 01/29/23   Revankar, Aundra Dubin, MD  JARDIANCE 25  MG TABS tablet Take 25 mg by mouth daily. 01/29/20   [provider]  levothyroxine (SYNTHROID) 88 MCG tablet Take 88 mcg by mouth daily before breakfast.    [provider]  metFORMIN (GLUCOPHAGE) 850 MG tablet Take 850 mg by mouth 3 (three) times daily.    [provider]  metoprolol succinate (TOPROL-XL) 25 MG 24 hr tablet Take 1 tablet (25 mg total) by mouth daily. 01/28/23   Janetta Hora, PA-C  nitroGLYCERIN (NITROSTAT) 0.4 MG SL tablet Place 0.4 mg under the tongue every 5 (five) minutes as needed for chest pain.    [provider]  prednisoLONE acetate (PRED FORTE) 1 % ophthalmic suspension Place 1 drop into the left eye 4 (four) times daily.    [provider]  raloxifene (EVISTA) 60 MG tablet Take 1 tablet by mouth once daily 10/23/22   [name], MD  rivaroxaban (XARELTO) 20 MG TABS tablet Take 20 mg by mouth daily with supper.    [provider]  torsemide (DEMADEX) 20 MG tablet Take 20 mg by mouth 2 (two) times daily.    [provider]  traMADol (ULTRAM) 50 MG tablet Take 50 mg by mouth 2 (two) times daily as needed for moderate pain. 01/11/20   [provider]     Allergies:   No Known Allergies  Social History:   Social History   Socioeconomic History   Marital status: Married    Spouse name: Not on file   Number of children: Not on file   Years of education: Not on file   Highest education level: Not on file  Occupational History   Not on file  Tobacco Use   Smoking status: Never    Passive exposure: Yes   Smokeless tobacco: Never   Tobacco comments:    "whole family" smoked in the house  Vaping Use   Vaping status: Never Used  Substance and Sexual Activity   Alcohol use: No   Drug use: No   Sexual activity: Not on file  Other Topics Concern   Not on file  Social History Narrative   Not on file   Social Determinants of Health   Financial Resource Strain: Not on file  Food  Insecurity: Not on file  Transportation Needs: Not on file  Physical Activity: Not on file  Stress: Not on file  Social Connections: Not on file  Intimate Partner Violence: Not on file    Family History:   The patient's family history includes Breast cancer in her paternal aunt; Diabetes in her mother; Stroke in her father and paternal aunt.    ROS:  Please see the history of present illness.  All other ROS reviewed and negative.     Physical Exam/Data:   Vitals:   01/30/23 0018 01/30/23 0027 01/30/23 0300 01/30/23 0345  BP: (!) 137/52   (!) 148/59  Pulse: 64  (!) 54 61  Resp: 20  20 20   Temp: (!) 97.5 F (36.4 C)  98 F (36.7 C)   TempSrc:   Oral   SpO2: 94%  97% 96%  Weight:  95.2 kg  Height:  5' (1.524 m)     No intake or output data in the 24 hours ending 01/30/23 0520    01/30/2023   12:27 AM 01/28/2023    5:00 AM 01/27/2023    5:00 AM  Last 3 Weights  Weight (lbs) 209 lb 14.1 oz 209 lb 12.8 oz 212 lb 11.9 oz  Weight (kg) 95.2 kg 95.165 kg 96.5 kg     Body mass index is 40.99 kg/m.  General:  Well nourished, well developed, in no acute distress HEENT: normal Neck: no JVD Vascular: No carotid bruits; Distal pulses 2+ bilaterally   Cardiac:  normal S1, S2; RRR; no murmur  Lungs:  clear to auscultation bilaterally, no wheezing, rhonchi or rales  Abd: soft, nontender, no hepatomegaly  Ext: no edema Musculoskeletal:  No deformities, BUE and BLE strength normal and equal Skin: warm and dry  Neuro:  CNs 2-12 intact, no focal abnormalities noted Psych:  Normal affect    EKG:  The ECG that was done  was personally reviewed and demonstrates normal sinus rhythm  Relevant CV Studies: None  Laboratory Data:  High Sensitivity Troponin:   Recent Labs  Lab 01/30/23 0044 01/30/23 0233  TROPONINIHS 245* 203*      Chemistry Recent Labs  Lab 01/27/23 0305 01/28/23 0447 01/30/23 0044 01/30/23 0327  NA 136 139 138  --   K 4.2 3.2* 3.4*  --   CL 106 104 104   --   CO2 23 25 23   --   GLUCOSE 110* 117* 160*  --   BUN 9 9 11   --   CREATININE 0.64 0.85 1.07*  --   CALCIUM 8.1* 7.8* 8.5*  --   MG 2.0  --   --  2.1  GFRNONAA >60 >60 56*  --   ANIONGAP 7 10 11   --     No results for input(s): "PROT", "ALBUMIN", "AST", "ALT", "ALKPHOS", "BILITOT" in the last 168 hours. Lipids No results for input(s): "CHOL", "TRIG", "HDL", "LABVLDL", "LDLCALC", "CHOLHDL" in the last 168 hours. Hematology Recent Labs  Lab 01/28/23 0447 01/30/23 0044  WBC 4.9 6.2  RBC 4.07 4.44  HGB 9.7* 10.5*  HCT 32.4* 36.0  MCV 79.6* 81.1  MCH 23.8* 23.6*  MCHC 29.9* 29.2*  RDW 16.1* 16.2*  PLT 83* 114*   Thyroid No results for input(s): "TSH", "FREET4" in the last 168 hours. BNPNo results for input(s): "BNP", "PROBNP" in the last 168 hours.  DDimer No results for input(s): "DDIMER" in the last 168 hours.   Radiology/Studies:  CT Head Wo Contrast  Result Date: 01/30/2023 CLINICAL DATA:  Recent syncopal episodes EXAM: CT HEAD WITHOUT CONTRAST TECHNIQUE: Contiguous axial images were obtained from the base of the skull through the vertex without intravenous contrast. RADIATION DOSE REDUCTION: This exam was performed according to the departmental dose-optimization program which includes automated exposure control, adjustment of the mA and/or kV according to patient size and/or use of iterative reconstruction technique. COMPARISON:  09/06/2012 FINDINGS: Brain: No evidence of acute infarction, hemorrhage, hydrocephalus, extra-axial collection or mass lesion/mass effect. Vascular: No hyperdense vessel or unexpected calcification. Skull: Normal. Negative for fracture or focal lesion. Sinuses/Orbits: No acute finding. Other: None. IMPRESSION: No acute intracranial abnormality noted. Electronically Signed   By: Alcide Clever M.D.   On: 01/30/2023 02:57   DG Chest 1 View  Result Date: 01/30/2023 CLINICAL DATA:  Recent syncopal episode EXAM: PORTABLE CHEST 1 VIEW COMPARISON:   01/22/2023 FINDINGS: Cardiac shadow is enlarged but stable.  Aortic calcifications are seen. Changes of prior TAVR are now noted. Lungs are well aerated bilaterally. Mild central vascular congestion is noted with minimal interstitial edema. No bony abnormality is seen. IMPRESSION: Mild CHF. Electronically Signed   By: Alcide Clever M.D.   On: 01/30/2023 02:09     Assessment and Plan:   Sinus arrest with first-degree AV block Intermittently having sinus pauses greater than 10 seconds with no ventricular escape.  She will need to be evaluated by EP again in determine if she needs to pacemaker.  I have held her metoprolol and Xarelto while they are evaluating her. -Will have EP evaluate -Hold metoprolol  2.  Severe AS status post recent TAVR TAVR on 01/26/2023 with a 29 evolute valve.  Her postoperative echo was stable.  She has not had any symptoms.  3.  Hypertension, HFpEF -Continue home meds except for metoprolol  4.  Paroxysmal atrial fibrillation -Hold metoprolol -Continue amiodarone -Hold Xarelto for possible pacemaker  5.  Elevated troponin This was checked in the triage however she has not been having any symptoms.  Do not think this warrants any further evaluation and is likely related to the syncopal event.    Risk Assessment/Risk Scores:         CHA2DS2-VASc Score = 6   This indicates a 9.7% annual risk of stroke. The patient's score is based upon: CHF History: 1 HTN History: 1 Diabetes History: 1 Stroke History: 0 Vascular Disease History: 1 Age Score: 1 Gender Score: 1     Code Status: Full Code  Severity of Illness: The appropriate patient status for this patient is INPATIENT. Inpatient status is judged to be reasonable and necessary in order to provide the required intensity of service to ensure the patient's safety. The patient's presenting symptoms, physical exam findings, and initial radiographic and laboratory data in the context of their chronic  comorbidities is felt to place them at high risk for further clinical deterioration. Furthermore, it is not anticipated that the patient will be medically stable for discharge from the hospital within 2 midnights of admission.   * I certify that at the point of admission it is my clinical judgment that the patient will require inpatient hospital care spanning beyond 2 midnights from the point of admission due to high intensity of service, high risk for further deterioration and high frequency of surveillance required.*   For questions or updates, please contact [facility] Please consult [email or web address] for contact info under     Signed, Joellen Jersey, MD  01/30/2023 5:20 AM

## 2023-01-30 NOTE — ED Notes (Signed)
RN sent secure chat to Dr. Graciela Husbands inquiring about NPO status with or without meds. Awaiting response.

## 2023-01-30 NOTE — ED Notes (Signed)
RN ambulated with pt to restroom. Dr. Graciela Husbands recommended going forward pt to use [facility] and have 1 assist at all times.   RN placed pt on fall precautions.

## 2023-01-30 NOTE — ED Notes (Addendum)
Dr. Graciela Husbands gave verbal order to place bilateral IV access with 50 cc of normal saline infusing.

## 2023-01-30 NOTE — ED Notes (Signed)
It was her aortic valve replaced

## 2023-01-30 NOTE — ED Notes (Signed)
ED TO INPATIENT HANDOFF REPORT  ED Nurse Name and Phone #:  Cat [phone]  S Name/Age/Gender Crystal Ramos 67 y.o. female Room/Bed: 037C/037C  Code Status   Code Status: Full Code  Home/SNF/Other Home Patient oriented to: self, place, time, and situation Is this baseline? Yes   Triage Complete: Triage complete  Chief Complaint Sinus pause [I45.5]  Triage Note The pt had a syncopal episode 1700  and 1900 she has on a monitor and she was called and she was told to come to the hospital  that something had occurred with her heart.. no pain or discomfort  no previous problem like this heart valve replaced this past tuesday   Allergies No Known Allergies  Level of Care/Admitting Diagnosis ED Disposition     ED Disposition  Admit   Condition  --   Comment  Hospital Area: [facility] [[id number]]  Level of Care: Telemetry Cardiac [[id number]]  May admit patient to [facility] or [facility] if equivalent level of care is available:: No  Covid Evaluation: Asymptomatic - no recent exposure (last 10 days) testing not required  Diagnosis: Sinus pause [454098]  Admitting Physician: Joellen Jersey [[id number]]  Attending Physician: Little Ishikawa [[id number]]  Certification:: I certify this patient will need inpatient services for at least 2 midnights  Expected Medical Readiness: 02/02/2023          B Medical/Surgery History Past Medical History:  Diagnosis Date   Angina pectoris (HCC) 03/27/2019   Anticoagulant long-term use 06/26/2021   Atherosclerotic vascular disease 02/25/2017   Chronic diastolic heart failure (HCC) 03/08/2019   Coronary artery disease involving native coronary artery of native heart without angina pectoris    Diabetes mellitus due to underlying condition with unspecified complications (HCC) 03/27/2019   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 Thukkani and Bartle   Severe aortic stenosis 12/02/2022   Type 2 diabetes mellitus without complication (HCC) 12/18/2015   Past Surgical History:  Procedure Laterality Date   CHOLECYSTECTOMY     INTRAOPERATIVE TRANSTHORACIC ECHOCARDIOGRAM  01/26/2023   Procedure: INTRAOPERATIVE TRANSTHORACIC ECHOCARDIOGRAM;  Surgeon: Orbie Pyo, MD;  [facility];  Service: Open Heart Surgery;;   LEFT HEART CATH AND CORONARY ANGIOGRAPHY N/A 04/03/2019   Procedure: LEFT HEART CATH AND CORONARY ANGIOGRAPHY;  Surgeon: Kathleene Hazel, MD;  [facility];  Service: Cardiovascular;  Laterality: N/A;   MEDIAL PARTIAL KNEE REPLACEMENT     RIGHT/LEFT HEART CATH AND CORONARY ANGIOGRAPHY N/A 12/07/2022   Procedure: RIGHT/LEFT HEART CATH AND CORONARY ANGIOGRAPHY;  Surgeon: Yvonne Kendall, MD;  [facility];  Service: Cardiovascular;  Laterality: N/A;   TRANSCATHETER AORTIC VALVE REPLACEMENT, TRANSFEMORAL Right 01/26/2023   Procedure: Transcatheter Aortic Valve Replacement, Transfemoral;  Surgeon: Orbie Pyo, MD;  [facility];  Service: Open Heart Surgery;  Laterality: Right;     A IV Location/Drains/Wounds Patient Lines/Drains/Airways Status     Active Line/Drains/Airways     Name Placement date Placement time Site Days   Peripheral  IV 01/30/23 18 G Right Antecubital 01/30/23  0122  Antecubital  less than 1   Peripheral IV 01/30/23 20 G Left Forearm 01/30/23  1032  Forearm  less than 1            Intake/Output Last 24 hours No intake or output data in the 24 hours ending  01/30/23 1413  Labs/Imaging Results for orders placed or performed during the hospital encounter of 01/30/23 (from the past 48 hour(s))  Basic metabolic panel     Status: Abnormal   Collection Time: 01/30/23 12:44 AM  Result Value Ref Range   Sodium 138 135 - 145 mmol/L   Potassium 3.4 (L) 3.5 - 5.1 mmol/L   Chloride 104 98 - 111 mmol/L   CO2 23 22 - 32 mmol/L   Glucose, Bld 160 (H) 70 - 99 mg/dL    Comment: Glucose reference range applies only to samples taken after fasting for at least 8 hours.   BUN 11 8 - 23 mg/dL   Creatinine, Ser 1.61 (H) 0.44 - 1.00 mg/dL   Calcium 8.5 (L) 8.9 - 10.3 mg/dL   GFR, Estimated 56 (L) >60 mL/min    Comment: (NOTE) Calculated using the CKD-EPI Creatinine Equation (2021)    Anion gap 11 5 - 15    Comment: Performed at [facility], [address], [place], [place] [address]  CBC     Status: Abnormal   Collection Time: 01/30/23 12:44 AM  Result Value Ref Range   WBC 6.2 4.0 - 10.5 K/uL   RBC 4.44 3.87 - 5.11 MIL/uL   Hemoglobin 10.5 (L) 12.0 - 15.0 g/dL   HCT 54.0 98.1 - 19.1 %   MCV 81.1 80.0 - 100.0 fL   MCH 23.6 (L) 26.0 - 34.0 pg   MCHC 29.2 (L) 30.0 - 36.0 g/dL   RDW 47.8 (H) 29.5 - 62.1 %   Platelets 114 (L) 150 - 400 K/uL   nRBC 0.0 0.0 - 0.2 %    Comment: Performed at [facility], [address] [place], [place] [address]  Troponin I (High Sensitivity)     Status: Abnormal   Collection Time: 01/30/23 12:44 AM  Result Value Ref Range   Troponin I (High Sensitivity) 245 (HH) <18 ng/L    Comment: CRITICAL RESULT CALLED TO, READ BACK BY AND VERIFIED WITH Birdie Hopes RN [phone] M.ALAMANO (NOTE) Elevated high sensitivity troponin I (hsTnI) values and significant  changes across serial measurements may suggest ACS but many other  chronic and acute conditions are known to elevate hsTnI results.  Refer to the "Links" section for chest pain algorithms and additional  guidance. Performed at [facility], [address] [place], [place] [address]   CBG monitoring, ED     Status: Abnormal   Collection Time: 01/30/23  1:15 AM  Result Value Ref Range   Glucose-Capillary 155 (H) 70 - 99 mg/dL    Comment: Glucose reference range applies only to samples taken after fasting for at least 8 hours.  Troponin I (High Sensitivity)     Status: Abnormal   Collection Time: 01/30/23  2:33 AM  Result Value Ref Range   Troponin I (High Sensitivity) 203 (HH) <18 ng/L    Comment: CRITICAL VALUE NOTED. VALUE IS CONSISTENT WITH PREVIOUSLY REPORTED/CALLED VALUE (NOTE) Elevated high sensitivity troponin I (hsTnI) values and significant  changes across serial measurements may suggest ACS but many other  chronic and acute conditions are known  to elevate hsTnI results.  Refer to the "Links" section for chest pain algorithms and additional  guidance. Performed at [facility], [address] [place], [place] [address]   Magnesium     Status: None   Collection Time: 01/30/23  3:27 AM  Result Value Ref Range   Magnesium 2.1 1.7 - 2.4 mg/dL    Comment: Performed at [facility], [address], [place], [place] [address]   CT Head Wo Contrast  Result Date: 01/30/2023 CLINICAL DATA:  Recent syncopal episodes EXAM: CT HEAD WITHOUT CONTRAST TECHNIQUE: Contiguous axial images were obtained from the base of the skull through the vertex without intravenous contrast. RADIATION DOSE REDUCTION: This exam was performed according to the departmental dose-optimization program which includes automated exposure control, adjustment of the mA and/or kV according to patient size and/or use of iterative reconstruction technique. COMPARISON:  09/06/2012 FINDINGS: Brain: No evidence of acute infarction, hemorrhage, hydrocephalus, extra-axial collection or mass lesion/mass effect. Vascular: No hyperdense vessel or unexpected calcification. Skull: Normal. Negative for fracture or focal lesion. Sinuses/Orbits: No acute finding. Other:  None. IMPRESSION: No acute intracranial abnormality noted. Electronically Signed   By: Alcide Clever M.D.   On: 01/30/2023 02:57   DG Chest 1 View  Result Date: 01/30/2023 CLINICAL DATA:  Recent syncopal episode EXAM: PORTABLE CHEST 1 VIEW COMPARISON:  01/22/2023 FINDINGS: Cardiac shadow is enlarged but stable. Aortic calcifications are seen. Changes of prior TAVR are now noted. Lungs are well aerated bilaterally. Mild central vascular congestion is noted with minimal interstitial edema. No bony abnormality is seen. IMPRESSION: Mild CHF. Electronically Signed   By: Alcide Clever M.D.   On: 01/30/2023 02:09    Pending Labs Unresulted Labs (From admission, onward)     Start     Ordered   Signed and Held  Surgical PCR screen  (Screening)  Once,   R        Signed and Held            Vitals/Pain Today's Vitals   01/30/23 1045 01/30/23 1100 01/30/23 1115 01/30/23 1225  BP: (!) 127/59 125/70 (!) 137/101   Pulse: (!) 57 (!) 58 (!) 56   Resp: (!) 25 (!) 23 19   Temp:    98 F (36.7 C)  TempSrc:      SpO2: 95% 96% 92%   Weight:      Height:      PainSc:        Isolation Precautions No active isolations  Medications Medications  amiodarone (PACERONE) tablet 100 mg (100 mg Oral Not Given 01/30/23 1038)  ezetimibe (ZETIA) tablet 10 mg (10 mg Oral Not Given 01/30/23 1038)  torsemide (DEMADEX) tablet 20 mg (20 mg Oral Not Given 01/30/23 0800)  ALPRAZolam (XANAX) tablet 0.25 mg (has no administration in time range)  empagliflozin (JARDIANCE) tablet 25 mg (25 mg Oral Not Given 01/30/23 1038)  levothyroxine (SYNTHROID) tablet 88 mcg (88 mcg Oral Not Given 01/30/23 1038)  metFORMIN (GLUCOPHAGE) tablet 850 mg (850 mg Oral Not Given 01/30/23 1127)  raloxifene (EVISTA) tablet 60 mg (60 mg Oral Not Given 01/30/23 1039)  prednisoLONE acetate (PRED FORTE) 1 % ophthalmic suspension 1 drop (1 drop Left Eye Not Given 01/30/23 1125)  potassium chloride SA (KLOR-CON M) CR tablet 20 mEq (20 mEq Oral Given  01/30/23 0343)    Mobility walks with person assist     Focused Assessments Neuro Assessment Handoff:  Swallow screen pass?    Cardiac Rhythm:  Normal sinus rhythm       Neuro Assessment: Within Defined Limits Neuro Checks:      Has TPA been given?  If patient is a Neuro Trauma and patient is going to OR before floor call report to 4N Charge nurse: [phone] or [phone]   R Recommendations: See Admitting Provider Note  Report given to:   Additional Notes:

## 2023-01-30 NOTE — Plan of Care (Signed)
Problem: Education: Goal: Understanding of CV disease, CV risk reduction, and recovery process will improve Outcome: Progressing Goal: Individualized Educational Video(s) Outcome: Progressing   Problem: Activity: Goal: Ability to return to baseline activity level will improve Outcome: Progressing

## 2023-01-30 NOTE — Consult Note (Signed)
ELECTROPHYSIOLOGY CONSULT NOTE  Patient ID: Crystal Ramos, MRN: [id number], DOB/AGE: [birth year] 67 y.o. Admit date: 01/30/2023 Date of Consult: 01/30/2023  Primary Physician: Paulina Fusi, MD Primary Cardiologist: RR/s/pTAVR Crystal Ramos is a 67 y.o. female who is being seen today for the evaluation of SYNCOPAL  pauses at the request of [name].   Chief Complaint: syncope   HPI Crystal Ramos is a 67 y.o. female s/p TAVR with intermittent heart heart block while in hospital, discharged on a Zio patch with recurrent syncope and pauses of greater than 10 seconds.  History of PAF had been discharged on beta-blockers and Xarelto as well including amiodarone.  Preoperative first-degree AV block 272 post operative 300 ms with new left bundle branch block ER PR interval 312 ms  Review of the event recorder demonstrates least 2 episodes of complete heart block, 1 suggest sinus node dysfunction  No prior history of same  DATE TEST EF   7/24 LHC   b % MOD NON OBSTRUCTIVE CAD PAsys 48  9/24 Echo   55-60 %              Date Cr K Hgb  9/24 1.07 3.4 10.5   b       Past Medical History:  Diagnosis Date   Angina pectoris (HCC) 03/27/2019   Anticoagulant long-term use 06/26/2021   Atherosclerotic vascular disease 02/25/2017   Chronic diastolic heart failure (HCC) 03/08/2019   Coronary artery disease involving native coronary artery of native heart without angina pectoris    Diabetes mellitus due to underlying condition with unspecified complications (HCC) 03/27/2019   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 Thukkani and Bartle   Severe aortic stenosis 12/02/2022   Type 2 diabetes mellitus without complication (HCC) 12/18/2015       Surgical History:  Past Surgical History:  Procedure Laterality Date   CHOLECYSTECTOMY     INTRAOPERATIVE TRANSTHORACIC ECHOCARDIOGRAM  01/26/2023   Procedure: INTRAOPERATIVE TRANSTHORACIC ECHOCARDIOGRAM;  Surgeon: Orbie Pyo, MD;  Location: [facility];  Service: Open Heart Surgery;;   LEFT HEART CATH AND CORONARY ANGIOGRAPHY N/A 04/03/2019   Procedure: LEFT HEART CATH AND CORONARY ANGIOGRAPHY;  Surgeon: Kathleene Hazel, MD;  Location: [facility];  Service: Cardiovascular;  Laterality: N/A;   MEDIAL PARTIAL KNEE REPLACEMENT     RIGHT/LEFT HEART CATH AND CORONARY ANGIOGRAPHY N/A 12/07/2022   Procedure: RIGHT/LEFT HEART CATH AND CORONARY ANGIOGRAPHY;  Surgeon: Yvonne Kendall, MD;  Location: [facility];  Service: Cardiovascular;  Laterality: N/A;   TRANSCATHETER AORTIC VALVE REPLACEMENT, TRANSFEMORAL Right 01/26/2023   Procedure: Transcatheter Aortic Valve Replacement, Transfemoral;  Surgeon: Orbie Pyo, MD;  Location: [facility];  Service: Open Heart Surgery;  Laterality: Right;     Home Meds: Prior to Admission medications   Medication Sig Start Date End Date Taking? Authorizing Provider  ALPRAZolam (XANAX) 0.25 MG tablet Take 0.25 mg by mouth at bedtime.  07/26/18  Yes [provider]  amiodarone (PACERONE) 200 MG tablet  Take 0.5 tablets (100 mg total) by mouth 2 (two) times daily. 06/29/22  Yes Revankar, Aundra Dubin, MD  ezetimibe (ZETIA) 10 MG tablet Take 1 tablet by mouth once daily 01/29/23  Yes Revankar, Aundra Dubin, MD  JARDIANCE 25 MG TABS tablet Take 25 mg by mouth daily. 01/29/20  Yes [provider]  ketorolac (ACULAR) 0.5 % ophthalmic solution Place 1 drop into the left eye 4 (four) times daily. 01/14/23  Yes  [provider]  levothyroxine (SYNTHROID) 88 MCG tablet Take 88 mcg by mouth daily before breakfast.   Yes [provider]  metFORMIN (GLUCOPHAGE) 850 MG tablet Take 850 mg by mouth 3 (three) times daily.   Yes [provider]  metoprolol succinate (TOPROL-XL) 25 MG 24 hr tablet Take 1 tablet (25 mg total) by mouth daily. 01/28/23  Yes Janetta Hora, PA-C  nitroGLYCERIN (NITROSTAT) 0.4 MG SL tablet Place 0.4 mg under the tongue every 5 (five) minutes as needed for chest pain.   Yes [provider]  prednisoLONE acetate (PRED FORTE) 1 % ophthalmic suspension Place 1 drop into the left eye 4 (four) times daily.   Yes [provider]  raloxifene (EVISTA) 60 MG tablet Take 1 tablet by mouth once daily 10/23/22  Yes [name], MD  rivaroxaban (XARELTO) 20 MG TABS tablet Take 20 mg by mouth daily with supper.   Yes [provider]  torsemide (DEMADEX) 20 MG tablet Take 20 mg by mouth 2 (two) times daily.   Yes [provider]  traMADol (ULTRAM) 50 MG tablet Take 50 mg by mouth 2 (two) times daily as needed for moderate pain. 01/11/20  Yes [provider]    Inpatient Medications:   ALPRAZolam  0.25 mg Oral QHS   amiodarone  100 mg Oral BID   empagliflozin  25 mg Oral Daily   ezetimibe  10 mg Oral Daily   levothyroxine  88 mcg Oral QAC breakfast   metFORMIN  850 mg Oral TID   prednisoLONE acetate  1 drop Left Eye QID   raloxifene  60 mg Oral Daily   torsemide  20 mg Oral BID   V   Allergies: No Known Allergies  Social History   Socioeconomic History   Marital status: Married    Spouse name: Not on file   Number of children: Not on file   Years of education: Not on file   Highest education level: Not on file  Occupational History   Not on file  Tobacco Use   Smoking status: Never    Passive exposure: Yes   Smokeless tobacco: Never   Tobacco comments:    "whole family" smoked in the house  Vaping  Use   Vaping status: Never Used  Substance and Sexual Activity   Alcohol use: No   Drug use: No   Sexual activity: Not on file  Other Topics Concern   Not on file  Social History Narrative   Not on file   Social Determinants of Health   Financial Resource Strain: Not on file  Food Insecurity: Not on file  Transportation Needs: Not on file  Physical Activity: Not on file  Stress: Not on file  Social Connections: Not on file  Intimate Partner Violence: Not on file     Family History  Problem Relation Age of Onset   Diabetes Mother    Stroke Father    Stroke Paternal Aunt    Breast cancer Paternal Aunt  ROS:  Please see the history of present illness.     All other systems reviewed and negative.    Physical Exam:  Blood pressure 122/62, pulse 62, temperature 97.7 F (36.5 C), temperature source Oral, resp. rate (!) 24, height 5' (1.524 m), weight 95.2 kg, SpO2 94%. General: Well developed, well nourished female in no acute distress. Head: Normocephalic, atraumatic, sclera non-icteric, no xanthomas, nares are without discharge. EENT: normal Lymph Nodes:  none Back: without scoliosis/kyphosis, no CVA tendersness Neck: Negative for carotid bruits. JVD not elevated. Lungs: Clear bilaterally to auscultation without wheezes, rales, or rhonchi. Breathing is unlabored. Heart: RRR with S1 S2 2/6 systolic murmur , rubs, or gallops appreciated. Abdomen: Soft, non-tender, non-distended with normoactive bowel sounds. No hepatomegaly. No rebound/guarding. No obvious abdominal masses. Msk:  Strength and tone appear normal for age. Extremities: No clubbing or cyanosis. No edema.  Distal pedal pulses are 2+ and equal bilaterally. Skin: Warm and Dry Neuro: Alert and oriented X 3. CN III-XII intact Grossly normal sensory and motor function . Psych:  Responds to questions appropriately with a normal affect.      Labs: Cardiac Enzymes No results for input(s): "CKTOTAL", "CKMB",  "TROPONINI" in the last 72 hours. CBC Lab Results  Component Value Date   WBC 6.2 01/30/2023   HGB 10.5 (L) 01/30/2023   HCT 36.0 01/30/2023   MCV 81.1 01/30/2023   PLT 114 (L) 01/30/2023   PROTIME: No results for input(s): "LABPROT", "INR" in the last 72 hours. Chemistry  Recent Labs  Lab 01/30/23 0044  NA 138  K 3.4*  CL 104  CO2 23  BUN 11  CREATININE 1.07*  CALCIUM 8.5*  GLUCOSE 160*   Lipids Lab Results  Component Value Date   CHOL 164 01/12/2022   HDL 56 01/12/2022   LDLCALC 95 01/12/2022   TRIG 69 01/12/2022   BNP NT-Pro BNP  Date/Time Value Ref Range Status  11/20/2022 03:17 PM 280 0 - 301 pg/mL Final    Comment:    The following cut-points have been suggested for the use of proBNP for the diagnostic evaluation of heart failure (HF) in patients with acute dyspnea: Modality                     Age           Optimal Cut                            (years)            Point ------------------------------------------------------ Diagnosis (rule in HF)        <50            450 pg/mL                           50 - 75            900 pg/mL                               >75           1800 pg/mL Exclusion (rule out HF)  Age independent     300 pg/mL    Thyroid Function Tests: No results for input(s): "TSH", "T4TOTAL", "T3FREE", "THYROIDAB" in the last 72 hours.  Invalid input(s): "FREET3"  EKG: As above.  Sinus rhythm with left bundle branch block and first-degree AV block   Assessment and Plan:  Syncope with intermittent complete heart block  1 AVB  TAVR 9/24 with new left bundle branch block  Anemia subacute  Hypokalemia  Patient has intermittent complete heart block following TAVR.  She needs pacing for relief of symptoms. The benefits and risks were reviewed including but not limited to death,  perforation, infection, lead dislodgement and device malfunction.  The patient understands agrees and is willing to proceed.  Will replete  potassium.  Check iron labs.   Sherryl Manges

## 2023-01-30 NOTE — ED Notes (Signed)
Dr. Graciela Husbands recommend pt to be NPO at this time.

## 2023-01-30 NOTE — ED Provider Notes (Signed)
[place] EMERGENCY DEPARTMENT AT [facility] Provider Note   CSN: [id number] Arrival date & time: 01/30/23  0011     History  Chief Complaint  Patient presents with   Loss of Consciousness    Crystal Ramos is a 67 y.o. female.  The history is provided by the patient and medical records.  Loss of Consciousness Crystal Ramos is a 67 y.o. female who presents to the Emergency Department complaining of syncope.  She presents to the emergency department for evaluation following 2 syncopal episodes that occurred today.  She was admitted to the hospital on September 9 for TAVR, complication of procedure was complete heart block that required temporary pacemaker.  She was discharged 2 days after procedure with temporary pacemaker removed and a Zio patch.  She does have a remote history of paroxysmal atrial fibrillation and does take Xarelto.  She also has type 2 diabetes, hypertension.  She was doing well after hospital discharge.  At 5 PM she was sitting on her couch and became dizzy, confused and lightheaded.  She is unsure what happened for approximately 30 minutes.  She had a second episode between 6 and 630 where she felt clammy, diaphoretic followed by feeling hot.  She could not talk during these episodes and she felt shaky in her arms and hands.  She lives alone and there was no witness to the episode so she is not sure if she fully lost consciousness or not.  She does report poor appetite since hospital discharge and has not been eating well.  No fever, chest pain, cough, sob. No AP/N/V/D. No leg swelling.      Home Medications Prior to Admission medications   Medication Sig Start Date End Date Taking? Authorizing Provider  ALPRAZolam (XANAX) 0.25 MG tablet Take 0.25 mg by mouth at bedtime.  07/26/18   [provider]  amiodarone (PACERONE) 200 MG tablet Take 0.5 tablets (100 mg total) by mouth 2 (two) times daily. 06/29/22   Revankar, Aundra Dubin, MD   ezetimibe (ZETIA) 10 MG tablet Take 1 tablet by mouth once daily 01/29/23   Revankar, Aundra Dubin, MD  JARDIANCE 25 MG TABS tablet Take 25 mg by mouth daily. 01/29/20   [provider]  levothyroxine (SYNTHROID) 88 MCG tablet Take 88 mcg by mouth daily before breakfast.    [provider]  metFORMIN (GLUCOPHAGE) 850 MG tablet Take 850 mg by mouth 3 (three) times daily.    [provider]  metoprolol succinate (TOPROL-XL) 25 MG 24 hr tablet Take 1 tablet (25 mg total) by mouth daily. 01/28/23   Janetta Hora, PA-C  nitroGLYCERIN (NITROSTAT) 0.4 MG SL tablet Place 0.4 mg under the tongue every 5 (five) minutes as needed for chest pain.    [provider]  prednisoLONE acetate (PRED FORTE) 1 % ophthalmic suspension Place 1 drop into the left eye 4 (four) times daily.    [provider]  raloxifene (EVISTA) 60 MG tablet Take 1 tablet by mouth once daily 10/23/22   [name], MD  rivaroxaban (XARELTO) 20 MG TABS tablet Take 20 mg by mouth daily with supper.    [provider]  torsemide (DEMADEX) 20 MG tablet Take 20 mg by mouth 2 (two) times daily.    [provider]  traMADol (ULTRAM) 50 MG tablet Take 50 mg by mouth 2 (two) times daily as needed for moderate pain. 01/11/20   [provider]  Allergies    Patient has no known allergies.    Review of Systems   Review of Systems  Cardiovascular:  Positive for syncope.  All other systems reviewed and are negative.   Physical Exam Updated Vital Signs BP 122/62 (BP Location: Left Arm)   Pulse 62   Temp 97.7 F (36.5 C) (Oral)   Resp (!) 24   Ht 5' (1.524 m)   Wt 95.2 kg   SpO2 94%   BMI 40.99 kg/m  Physical Exam Vitals and nursing note reviewed.  Constitutional:      Appearance: She is well-developed.  HENT:     Head: Normocephalic and atraumatic.  Cardiovascular:     Rate and Rhythm: Normal rate and regular rhythm.     Heart sounds: Murmur heard.   Pulmonary:     Effort: Pulmonary effort is normal. No respiratory distress.     Breath sounds: Normal breath sounds.  Abdominal:     Palpations: Abdomen is soft.     Tenderness: There is no abdominal tenderness. There is no guarding or rebound.  Musculoskeletal:        General: No tenderness.     Comments: Pitting edema to BLE  Skin:    General: Skin is warm and dry.  Neurological:     Mental Status: She is alert and oriented to person, place, and time.     Comments: 5 out of 5 strength in all 4 extremities with sensation to light touch intact in all 4 extremities.  Psychiatric:        Behavior: Behavior normal.     ED Results / Procedures / Treatments   Labs (all labs ordered are listed, but only abnormal results are displayed) Labs Reviewed  BASIC METABOLIC PANEL - Abnormal; Notable for the following components:      Result Value   Potassium 3.4 (*)    Glucose, Bld 160 (*)    Creatinine, Ser 1.07 (*)    Calcium 8.5 (*)    GFR, Estimated 56 (*)    All other components within normal limits  CBC - Abnormal; Notable for the following components:   Hemoglobin 10.5 (*)    MCH 23.6 (*)    MCHC 29.2 (*)    RDW 16.2 (*)    Platelets 114 (*)    All other components within normal limits  CBG MONITORING, ED - Abnormal; Notable for the following components:   Glucose-Capillary 155 (*)    All other components within normal limits  TROPONIN I (HIGH SENSITIVITY) - Abnormal; Notable for the following components:   Troponin I (High Sensitivity) 245 (*)    All other components within normal limits  TROPONIN I (HIGH SENSITIVITY) - Abnormal; Notable for the following components:   Troponin I (High Sensitivity) 203 (*)    All other components within normal limits  MAGNESIUM    EKG EKG Interpretation Date/Time:  Saturday January 30 2023 00:26:00 EDT Ventricular Rate:  59 PR Interval:  312 QRS Duration:  142 QT Interval:  508 QTC Calculation: 502 R Axis:   210  Text  Interpretation: Sinus bradycardia with marked sinus arrhythmia with 1st degree A-V block Non-specific intra-ventricular conduction block Inferior infarct , age undetermined Anterolateral infarct , age undetermined Abnormal ECG Confirmed by Tilden Fossa [phone]) on 01/30/2023 1:00:27 AM  Radiology CT Head Wo Contrast  Result Date: 01/30/2023 CLINICAL DATA:  Recent syncopal episodes EXAM: CT HEAD WITHOUT CONTRAST TECHNIQUE: Contiguous axial images were obtained from the base of the skull through  the vertex without intravenous contrast. RADIATION DOSE REDUCTION: This exam was performed according to the departmental dose-optimization program which includes automated exposure control, adjustment of the mA and/or kV according to patient size and/or use of iterative reconstruction technique. COMPARISON:  09/06/2012 FINDINGS: Brain: No evidence of acute infarction, hemorrhage, hydrocephalus, extra-axial collection or mass lesion/mass effect. Vascular: No hyperdense vessel or unexpected calcification. Skull: Normal. Negative for fracture or focal lesion. Sinuses/Orbits: No acute finding. Other: None. IMPRESSION: No acute intracranial abnormality noted. Electronically Signed   By: Alcide Clever M.D.   On: 01/30/2023 02:57   DG Chest 1 View  Result Date: 01/30/2023 CLINICAL DATA:  Recent syncopal episode EXAM: PORTABLE CHEST 1 VIEW COMPARISON:  01/22/2023 FINDINGS: Cardiac shadow is enlarged but stable. Aortic calcifications are seen. Changes of prior TAVR are now noted. Lungs are well aerated bilaterally. Mild central vascular congestion is noted with minimal interstitial edema. No bony abnormality is seen. IMPRESSION: Mild CHF. Electronically Signed   By: Alcide Clever M.D.   On: 01/30/2023 02:09    Procedures Procedures    Medications Ordered in ED Medications  amiodarone (PACERONE) tablet 100 mg (has no administration in time range)  ezetimibe (ZETIA) tablet 10 mg (has no administration in time range)   torsemide (DEMADEX) tablet 20 mg (has no administration in time range)  ALPRAZolam (XANAX) tablet 0.25 mg (has no administration in time range)  empagliflozin (JARDIANCE) tablet 25 mg (has no administration in time range)  levothyroxine (SYNTHROID) tablet 88 mcg (has no administration in time range)  metFORMIN (GLUCOPHAGE) tablet 850 mg (has no administration in time range)  raloxifene (EVISTA) tablet 60 mg (has no administration in time range)  prednisoLONE acetate (PRED FORTE) 1 % ophthalmic suspension 1 drop (has no administration in time range)  potassium chloride SA (KLOR-CON M) CR tablet 20 mEq (20 mEq Oral Given 01/30/23 0343)    ED Course/ Medical Decision Making/ A&P                                 Medical Decision Making Amount and/or Complexity of Data Reviewed Labs: ordered. Radiology: ordered.  Risk Prescription drug management. Decision regarding hospitalization.   Patient with recent TAVR here for evaluation following 2 syncopal episodes at home.  EKG with first-degree AV block in the emergency department.  Troponins are elevated but she does not have any active chest pain, likely secondary to recent procedure.  Labs with mild elevation in her creatinine compared to recent discharge.  Overall she is euvolemic to slightly volume up, will not provide fluids at this time.  Discussed with Dr. Hyacinth Meeker with cardiology-plan to admit to cardiology service for ongoing treatment.        Final Clinical Impression(s) / ED Diagnoses Final diagnoses:  Syncope and collapse    Rx / DC Orders ED Discharge Orders     None         Tilden Fossa, MD 01/30/23 [phone]

## 2023-01-30 NOTE — ED Triage Notes (Signed)
The pt had a syncopal episode 1700  and 1900 she has on a monitor and she was called and she was told to come to the hospital  that something had occurred with her heart.. no pain or discomfort  no previous problem like this heart valve replaced this past tuesday

## 2023-01-31 ENCOUNTER — Ambulatory Visit (HOSPITAL_COMMUNITY)
Admission: EM | Disposition: A | Discharge status: Home / Self Care | Payer: Self-pay | Source: Home / Self Care | Attending: Emergency Medicine

## 2023-01-31 ENCOUNTER — Observation Stay (HOSPITAL_COMMUNITY): Payer: PPO

## 2023-01-31 ENCOUNTER — Other Ambulatory Visit: Payer: Self-pay | Admitting: Physician Assistant

## 2023-01-31 DIAGNOSIS — Z7982 Long term (current) use of aspirin: Secondary | ICD-10-CM | POA: Diagnosis not present

## 2023-01-31 DIAGNOSIS — I480 Paroxysmal atrial fibrillation: Secondary | ICD-10-CM | POA: Diagnosis not present

## 2023-01-31 DIAGNOSIS — I5032 Chronic diastolic (congestive) heart failure: Secondary | ICD-10-CM | POA: Diagnosis not present

## 2023-01-31 DIAGNOSIS — E876 Hypokalemia: Secondary | ICD-10-CM

## 2023-01-31 DIAGNOSIS — Z7901 Long term (current) use of anticoagulants: Secondary | ICD-10-CM | POA: Diagnosis not present

## 2023-01-31 DIAGNOSIS — R7989 Other specified abnormal findings of blood chemistry: Secondary | ICD-10-CM | POA: Diagnosis not present

## 2023-01-31 DIAGNOSIS — I2510 Atherosclerotic heart disease of native coronary artery without angina pectoris: Secondary | ICD-10-CM | POA: Diagnosis not present

## 2023-01-31 DIAGNOSIS — Z950 Presence of cardiac pacemaker: Secondary | ICD-10-CM | POA: Diagnosis not present

## 2023-01-31 DIAGNOSIS — E119 Type 2 diabetes mellitus without complications: Secondary | ICD-10-CM | POA: Diagnosis not present

## 2023-01-31 DIAGNOSIS — Z7984 Long term (current) use of oral hypoglycemic drugs: Secondary | ICD-10-CM | POA: Diagnosis not present

## 2023-01-31 DIAGNOSIS — I110 Hypertensive heart disease with heart failure: Secondary | ICD-10-CM | POA: Diagnosis not present

## 2023-01-31 DIAGNOSIS — Z79899 Other long term (current) drug therapy: Secondary | ICD-10-CM | POA: Diagnosis not present

## 2023-01-31 DIAGNOSIS — I442 Atrioventricular block, complete: Secondary | ICD-10-CM | POA: Diagnosis not present

## 2023-01-31 DIAGNOSIS — I517 Cardiomegaly: Secondary | ICD-10-CM | POA: Diagnosis not present

## 2023-01-31 HISTORY — DX: Hypokalemia: E87.6

## 2023-01-31 HISTORY — PX: PACEMAKER IMPLANT: EP1218

## 2023-01-31 LAB — BASIC METABOLIC PANEL
Anion gap: 9 (ref 5–15)
BUN: 14 mg/dL (ref 8–23)
CO2: 23 mmol/L (ref 22–32)
Calcium: 8.1 mg/dL — ABNORMAL LOW (ref 8.9–10.3)
Chloride: 105 mmol/L (ref 98–111)
Creatinine, Ser: 0.8 mg/dL (ref 0.44–1.00)
GFR, Estimated: >60 mL/min (ref >60)
Glucose, Bld: 109 mg/dL — ABNORMAL HIGH (ref 70–99)
Potassium: 3.8 mmol/L (ref 3.5–5.1)
Sodium: 137 mmol/L (ref 135–145)

## 2023-01-31 SURGERY — PACEMAKER IMPLANT
Anesthesia: LOCAL

## 2023-01-31 MED ORDER — LIDOCAINE HCL (PF) 1 % IJ SOLN
INTRAMUSCULAR | Status: DC | PRN
  Administered 2023-01-31: 50 mL

## 2023-01-31 MED ORDER — CEFAZOLIN SODIUM-DEXTROSE 1-4 GM/50ML-% IV SOLN
1.0000 g | Freq: Four times a day (QID) | INTRAVENOUS | Status: DC
  Administered 2023-01-31: 1 g via INTRAVENOUS
  Filled 2023-01-31 (×2): qty 50

## 2023-01-31 MED ORDER — SODIUM CHLORIDE 0.9 % IV SOLN
INTRAVENOUS | Status: AC
  Filled 2023-01-31: qty 2

## 2023-01-31 MED ORDER — FENTANYL CITRATE (PF) 100 MCG/2ML IJ SOLN
INTRAMUSCULAR | Status: DC | PRN
  Administered 2023-01-31 (×4): 25 ug via INTRAVENOUS

## 2023-01-31 MED ORDER — POTASSIUM CHLORIDE CRYS ER 20 MEQ PO TBCR: 20.0000 meq | EXTENDED_RELEASE_TABLET | Freq: Every day | ORAL | 11 refills | Status: DC

## 2023-01-31 MED ORDER — ACETAMINOPHEN 325 MG PO TABS: 325.0000 mg | ORAL_TABLET | ORAL | Status: DC | PRN

## 2023-01-31 MED ORDER — MIDAZOLAM HCL 2 MG/2ML IJ SOLN
INTRAMUSCULAR | Status: AC
  Filled 2023-01-31: qty 2

## 2023-01-31 MED ORDER — ONDANSETRON HCL 4 MG/2ML IJ SOLN: 4.0000 mg | Freq: Four times a day (QID) | INTRAMUSCULAR | Status: DC | PRN

## 2023-01-31 MED ORDER — FENTANYL CITRATE (PF) 100 MCG/2ML IJ SOLN
INTRAMUSCULAR | Status: AC
  Filled 2023-01-31: qty 2

## 2023-01-31 MED ORDER — HEPARIN (PORCINE) IN NACL 1000-0.9 UT/500ML-% IV SOLN
INTRAVENOUS | Status: DC | PRN
  Administered 2023-01-31: 500 mL

## 2023-01-31 MED ORDER — LIDOCAINE HCL (PF) 1 % IJ SOLN
INTRAMUSCULAR | Status: AC
  Filled 2023-01-31: qty 90

## 2023-01-31 MED ORDER — RIVAROXABAN 20 MG PO TABS: 20.0000 mg | ORAL_TABLET | Freq: Every day | ORAL | Status: DC

## 2023-01-31 MED ORDER — MIDAZOLAM HCL 5 MG/5ML IJ SOLN
INTRAMUSCULAR | Status: DC | PRN
  Administered 2023-01-31 (×4): 1 mg via INTRAVENOUS

## 2023-01-31 MED ORDER — POTASSIUM CHLORIDE CRYS ER 20 MEQ PO TBCR
20.0000 meq | EXTENDED_RELEASE_TABLET | Freq: Every day | ORAL | Status: DC
  Filled 2023-01-31: qty 2

## 2023-01-31 MED ORDER — CEFAZOLIN SODIUM-DEXTROSE 2-4 GM/100ML-% IV SOLN
INTRAVENOUS | Status: AC
  Filled 2023-01-31: qty 100

## 2023-01-31 SURGICAL SUPPLY — 12 items
CABLE SURGICAL S-101-97-12 (CABLE) ×1 IMPLANT
CATH RIGHTSITE C315HIS02 (CATHETERS) IMPLANT
LEAD SELECT SECURE 3830 383069 (Lead) IMPLANT
LEAD ULTIPACE 52 LPA1231/52 (Lead) IMPLANT
MAT PREVALON FULL STRYKER (MISCELLANEOUS) IMPLANT
PACEMAKER ASSURITY DR-RF (Pacemaker) IMPLANT
PAD DEFIB RADIO PHYSIO CONN (PAD) ×1 IMPLANT
SELECT SECURE 3830 383069 (Lead) ×1 IMPLANT
SHEATH 7FR PRELUDE SNAP 13 (SHEATH) IMPLANT
SLITTER 6232ADJ (MISCELLANEOUS) IMPLANT
TRAY PACEMAKER INSERTION (PACKS) ×1 IMPLANT
WIRE HI TORQ VERSACORE-J 145CM (WIRE) IMPLANT

## 2023-01-31 NOTE — H&P (View-Only) (Signed)
Progress Note  Patient Name: Crystal Ramos Date of Encounter: 01/31/2023  Primary Cardiologist: Garwin Brothers, MD     Patient Profile     67 y.o. female s/p TAVR with post procedure new LBBB worsening 1 AVB intermittent CHB and syncope   Subjective   No overnight events  Inpatient Medications    Scheduled Meds:  ALPRAZolam  0.25 mg Oral QHS   amiodarone  100 mg Oral BID   empagliflozin  25 mg Oral Daily   ezetimibe  10 mg Oral Daily   gentamicin (GARAMYCIN) 80 mg in sodium chloride 0.9 % 500 mL irrigation  80 mg Irrigation On Call   levothyroxine  88 mcg Oral QAC breakfast   metFORMIN  850 mg Oral TID WC   prednisoLONE acetate  1 drop Left Eye QID   raloxifene  60 mg Oral Daily   sodium chloride 0.9 % with gentamicin (GARAMYCIN) ADS Med       torsemide  20 mg Oral BID   Continuous Infusions:  sodium chloride 50 mL/hr at 01/31/23 0455   sodium chloride 50 mL/hr at 01/31/23 0454   ceFAZolin      ceFAZolin (ANCEF) IV     PRN Meds: ceFAZolin, sodium chloride 0.9 % with gentamicin (GARAMYCIN) ADS Med   Vital Signs    Vitals:   01/30/23 1701 01/30/23 1929 01/30/23 2304 01/31/23 0447  BP: (!) 143/87 (!) 131/54 (!) 132/57 (!) 132/57  Pulse: 65 67 72   Resp: (!) 26 (!) 21 19 20   Temp: 98.2 F (36.8 C) 98.2 F (36.8 C) 98.4 F (36.9 C) 98 F (36.7 C)  TempSrc: Oral Oral Oral Oral  SpO2: 95% 94% 95% 95%  Weight:    93.2 kg  Height:        Intake/Output Summary (Last 24 hours) at 01/31/2023 0815 Last data filed at 01/31/2023 0500 Gross per 24 hour  Intake 8.2 ml  Output 3100 ml  Net -3091.8 ml   Filed Weights   01/30/23 0027 01/30/23 1500 01/31/23 0447  Weight: 95.2 kg 94.6 kg 93.2 kg    Telemetry    not - Personally Reviewed  ECG    - Personally Reviewed  Physical Exam    GEN: No acute distress.   Cardiac: RRR,2/6 systolic   Respiratory: Clear to auscultation bilaterally. GI: Soft, nontender, non-distended  MS: No edema; No  deformity. Neuro:  Nonfocal  Psych: Normal affect   Labs    Chemistry Recent Labs  Lab 01/27/23 0305 01/28/23 0447 01/30/23 0044  NA 136 139 138  K 4.2 3.2* 3.4*  CL 106 104 104  CO2 23 25 23   GLUCOSE 110* 117* 160*  BUN 9 9 11   CREATININE 0.64 0.85 1.07*  CALCIUM 8.1* 7.8* 8.5*  GFRNONAA >60 >60 56*  ANIONGAP 7 10 11      Hematology Recent Labs  Lab 01/27/23 0305 01/28/23 0447 01/30/23 0044  WBC 4.3 4.9 6.2  RBC 4.11 4.07 4.44  HGB 9.7* 9.7* 10.5*  HCT 33.1* 32.4* 36.0  MCV 80.5 79.6* 81.1  MCH 23.6* 23.8* 23.6*  MCHC 29.3* 29.9* 29.2*  RDW 15.8* 16.1* 16.2*  PLT PLATELET CLUMPS NOTED ON SMEAR, UNABLE TO ESTIMATE 83* 114*    Cardiac EnzymesNo results for input(s): "TROPONINI" in the last 168 hours. No results for input(s): "TROPIPOC" in the last 168 hours.   BNPNo results for input(s): "BNP", "PROBNP" in the last 168 hours.   DDimer No results for input(s): "DDIMER" in the  last 168 hours.   Radiology    CT Head Wo Contrast  Result Date: 01/30/2023 CLINICAL DATA:  Recent syncopal episodes EXAM: CT HEAD WITHOUT CONTRAST TECHNIQUE: Contiguous axial images were obtained from the base of the skull through the vertex without intravenous contrast. RADIATION DOSE REDUCTION: This exam was performed according to the departmental dose-optimization program which includes automated exposure control, adjustment of the mA and/or kV according to patient size and/or use of iterative reconstruction technique. COMPARISON:  09/06/2012 FINDINGS: Brain: No evidence of acute infarction, hemorrhage, hydrocephalus, extra-axial collection or mass lesion/mass effect. Vascular: No hyperdense vessel or unexpected calcification. Skull: Normal. Negative for fracture or focal lesion. Sinuses/Orbits: No acute finding. Other: None. IMPRESSION: No acute intracranial abnormality noted. Electronically Signed   By: Alcide Clever M.D.   On: 01/30/2023 02:57   DG Chest 1 View  Result Date:  01/30/2023 CLINICAL DATA:  Recent syncopal episode EXAM: PORTABLE CHEST 1 VIEW COMPARISON:  01/22/2023 FINDINGS: Cardiac shadow is enlarged but stable. Aortic calcifications are seen. Changes of prior TAVR are now noted. Lungs are well aerated bilaterally. Mild central vascular congestion is noted with minimal interstitial edema. No bony abnormality is seen. IMPRESSION: Mild CHF. Electronically Signed   By: Alcide Clever M.D.   On: 01/30/2023 02:09    Cardiac Studies     Assessment & Plan    Syncope with intermittent complete heart block   1 AVB   TAVR 9/24 with new left bundle branch block   Anemia subacute   Hypokalemia    The benefits and risks were reviewed including but not limited to death,  perforation, infection, lead dislodgement and device malfunction.  The patient understands agrees and is willing to proceed.  K repletion   For questions or updates, please contact [facility] Please consult [email or web address] for contact info under Cardiology/STEMI.      Signed, Sherryl Manges, MD  01/31/2023, 8:15 AM

## 2023-01-31 NOTE — Discharge Instructions (Addendum)
Do not take Xarelto until Thursday, 02/04/2023   After Your Pacemaker  You have a St. Jude Pacemaker  Do not lift your arm above shoulder height for 1 week after your procedure. After 7 days, you may progress as below.     Sunday February 07, 2023  Monday February 08, 2023 Tuesday February 09, 2023 Wednesday February 10, 2023   Do not lift, push, pull, or carry anything over 10 pounds with the affected arm until 6 weeks (Mar 15 2023) after your procedure.   Monitor your pacemaker site for redness, swelling, and drainage. Call the device clinic at [phone] if you experience these symptoms or fever/chills.  If your incision is closed with Dermabond:  You may shower 1 day after your pacemaker implant and wash your incision with soap and water. Avoid lotions, ointments, or perfumes over your incision until it is well-healed.  You may use a hot tub or a pool AFTER your wound check appointment if the incision is completely closed.  You may drive, unless driving has been restricted by your healthcare providers.  Your Pacemaker is not MRI compatible.  Remote monitoring is used to monitor your pacemaker from home. This monitoring is scheduled every 91 days by our office. It allows [place] to keep an eye on the functioning of your device to ensure it is working properly. You will routinely see your Electrophysiologist annually (more often if necessary).

## 2023-01-31 NOTE — Interval H&P Note (Signed)
History and Physical Interval Note:  01/31/2023 8:17 AM  Crystal Ramos  has presented today for surgery, with the diagnosis of complete heart block.  The various methods of treatment have been discussed with the patient and family. After consideration of risks, benefits and other options for treatment, the patient has consented to  Procedure(s): PACEMAKER IMPLANT (N/A) as a surgical intervention.  The patient's history has been reviewed, patient examined, no change in status, stable for surgery.  I have reviewed the patient's chart and labs.  Questions were answered to the patient's satisfaction.     Sherryl Manges  Seen this am

## 2023-01-31 NOTE — Progress Notes (Signed)
Progress Note  Patient Name: Crystal Ramos Date of Encounter: 01/31/2023  Primary Cardiologist: Garwin Brothers, MD     Patient Profile     67 y.o. female s/p TAVR with post procedure new LBBB worsening 1 AVB intermittent CHB and syncope   Subjective   No overnight events  Inpatient Medications    Scheduled Meds:  ALPRAZolam  0.25 mg Oral QHS   amiodarone  100 mg Oral BID   empagliflozin  25 mg Oral Daily   ezetimibe  10 mg Oral Daily   gentamicin (GARAMYCIN) 80 mg in sodium chloride 0.9 % 500 mL irrigation  80 mg Irrigation On Call   levothyroxine  88 mcg Oral QAC breakfast   metFORMIN  850 mg Oral TID WC   prednisoLONE acetate  1 drop Left Eye QID   raloxifene  60 mg Oral Daily   sodium chloride 0.9 % with gentamicin (GARAMYCIN) ADS Med       torsemide  20 mg Oral BID   Continuous Infusions:  sodium chloride 50 mL/hr at 01/31/23 0455   sodium chloride 50 mL/hr at 01/31/23 0454   ceFAZolin      ceFAZolin (ANCEF) IV     PRN Meds: ceFAZolin, sodium chloride 0.9 % with gentamicin (GARAMYCIN) ADS Med   Vital Signs    Vitals:   01/30/23 1701 01/30/23 1929 01/30/23 2304 01/31/23 0447  BP: (!) 143/87 (!) 131/54 (!) 132/57 (!) 132/57  Pulse: 65 67 72   Resp: (!) 26 (!) 21 19 20   Temp: 98.2 F (36.8 C) 98.2 F (36.8 C) 98.4 F (36.9 C) 98 F (36.7 C)  TempSrc: Oral Oral Oral Oral  SpO2: 95% 94% 95% 95%  Weight:    93.2 kg  Height:        Intake/Output Summary (Last 24 hours) at 01/31/2023 0815 Last data filed at 01/31/2023 0500 Gross per 24 hour  Intake 8.2 ml  Output 3100 ml  Net -3091.8 ml   Filed Weights   01/30/23 0027 01/30/23 1500 01/31/23 0447  Weight: 95.2 kg 94.6 kg 93.2 kg    Telemetry    not - Personally Reviewed  ECG    - Personally Reviewed  Physical Exam    GEN: No acute distress.   Cardiac: RRR,2/6 systolic   Respiratory: Clear to auscultation bilaterally. GI: Soft, nontender, non-distended  MS: No edema; No  deformity. Neuro:  Nonfocal  Psych: Normal affect   Labs    Chemistry Recent Labs  Lab 01/27/23 0305 01/28/23 0447 01/30/23 0044  NA 136 139 138  K 4.2 3.2* 3.4*  CL 106 104 104  CO2 23 25 23   GLUCOSE 110* 117* 160*  BUN 9 9 11   CREATININE 0.64 0.85 1.07*  CALCIUM 8.1* 7.8* 8.5*  GFRNONAA >60 >60 56*  ANIONGAP 7 10 11      Hematology Recent Labs  Lab 01/27/23 0305 01/28/23 0447 01/30/23 0044  WBC 4.3 4.9 6.2  RBC 4.11 4.07 4.44  HGB 9.7* 9.7* 10.5*  HCT 33.1* 32.4* 36.0  MCV 80.5 79.6* 81.1  MCH 23.6* 23.8* 23.6*  MCHC 29.3* 29.9* 29.2*  RDW 15.8* 16.1* 16.2*  PLT PLATELET CLUMPS NOTED ON SMEAR, UNABLE TO ESTIMATE 83* 114*    Cardiac EnzymesNo results for input(s): "TROPONINI" in the last 168 hours. No results for input(s): "TROPIPOC" in the last 168 hours.   BNPNo results for input(s): "BNP", "PROBNP" in the last 168 hours.   DDimer No results for input(s): "DDIMER" in the  last 168 hours.   Radiology    CT Head Wo Contrast  Result Date: 01/30/2023 CLINICAL DATA:  Recent syncopal episodes EXAM: CT HEAD WITHOUT CONTRAST TECHNIQUE: Contiguous axial images were obtained from the base of the skull through the vertex without intravenous contrast. RADIATION DOSE REDUCTION: This exam was performed according to the departmental dose-optimization program which includes automated exposure control, adjustment of the mA and/or kV according to patient size and/or use of iterative reconstruction technique. COMPARISON:  09/06/2012 FINDINGS: Brain: No evidence of acute infarction, hemorrhage, hydrocephalus, extra-axial collection or mass lesion/mass effect. Vascular: No hyperdense vessel or unexpected calcification. Skull: Normal. Negative for fracture or focal lesion. Sinuses/Orbits: No acute finding. Other: None. IMPRESSION: No acute intracranial abnormality noted. Electronically Signed   By: Alcide Clever M.D.   On: 01/30/2023 02:57   DG Chest 1 View  Result Date:  01/30/2023 CLINICAL DATA:  Recent syncopal episode EXAM: PORTABLE CHEST 1 VIEW COMPARISON:  01/22/2023 FINDINGS: Cardiac shadow is enlarged but stable. Aortic calcifications are seen. Changes of prior TAVR are now noted. Lungs are well aerated bilaterally. Mild central vascular congestion is noted with minimal interstitial edema. No bony abnormality is seen. IMPRESSION: Mild CHF. Electronically Signed   By: Alcide Clever M.D.   On: 01/30/2023 02:09    Cardiac Studies     Assessment & Plan    Syncope with intermittent complete heart block   1 AVB   TAVR 9/24 with new left bundle branch block   Anemia subacute   Hypokalemia    The benefits and risks were reviewed including but not limited to death,  perforation, infection, lead dislodgement and device malfunction.  The patient understands agrees and is willing to proceed.  K repletion   For questions or updates, please contact [facility] Please consult [email or web address] for contact info under Cardiology/STEMI.      Signed, Sherryl Manges, MD  01/31/2023, 8:15 AM

## 2023-01-31 NOTE — Care Management CC44 (Signed)
Condition Code 44 Documentation Completed  Patient Details  Name: Crystal Ramos MRN: [id number] Date of Birth: 1956/01/28   Condition Code 44 given:  Yes Patient signature on Condition Code 44 notice:  Yes Documentation of 2 MD's agreement:  Yes Code 44 added to claim:  Yes    Lawerance Sabal, RN 01/31/2023, 11:23 AM

## 2023-01-31 NOTE — Care Management Obs Status (Signed)
MEDICARE OBSERVATION STATUS NOTIFICATION   Patient Details  Name: Crystal Ramos MRN: [id number] Date of Birth: 1955-08-28   Medicare Observation Status Notification Given:  Yes    Lawerance Sabal, RN 01/31/2023, 11:23 AM

## 2023-01-31 NOTE — Progress Notes (Signed)
Purposes 4 and results of pacemaker procedure reviewed with family.  Instructions given anticipate discharge later today at about 1600 hrs.  Will need wound check in [facility] and follow-up in 90 days with Dr. Derek Jack in [place]

## 2023-01-31 NOTE — Discharge Summary (Signed)
Discharge Summary    Patient ID: Crystal Ramos MRN: [id number]; DOB: 01/11/1956  Admit date: 01/30/2023 Discharge date: 01/31/2023  PCP:  Paulina Fusi, MD   [facility] Providers Cardiologist:  Garwin Brothers, MD        Discharge Diagnoses    Principal Problem:   Heart block AV complete [facility]) Active Problems:   Paroxysmal atrial fibrillation (HCC)   Chronic diastolic heart failure (HCC)   Severe aortic stenosis   S/P TAVR (transcatheter aortic valve replacement)   Hypokalemia    Diagnostic Studies/Procedures    Dual Chamber Pacemaker Implantation 01/31/2023  _____________   History of Present Illness     Crystal Ramos is a 67 y.o. female with a history of chronic HFpEF, paroxysmal atrial fibrillation on Xarelto, severe aortic stenosis status post TAVR, nonobstructive coronary artery disease, hypertension, hyperlipidemia, diabetes.  She was discharged after her TAVR on 01/28/2023.  She had transient complete heart block after her TAVR.  She had no recurrent heart block for 48 hours and was sent home on ZIO AT monitor.  She presented back to the hospital on 01/30/2023 with syncope.  Her monitor demonstrated a 12-second pause.  Her monitor also demonstrated at least 4-5 episodes of greater than 10 seconds.  The patient was advised to come to the emergency room for further evaluation and management.  Hospital Course     Consultants: EP-Dr. Graciela Husbands  Patient was admitted for syncope and complete heart block with sinus arrest, new left bundle branch block postprocedure and worsening first-degree AV block.  Her Xarelto was placed on hold.  She was seen by Dr. Graciela Husbands for EP.  Pacemaker implantation was recommended.  Her hsTroponin was elevated but flat and not c/w ACS. This was felt to be due to demand ischemia from sinus arrest, complete heart block.   She underwent St Jude dual-chamber pacemaker implantation 01/31/2023 with Dr. Graciela Husbands: Atrial lead serial  number Abbott [id number] .  Medtronic lead deployment sheath with a Medtronic 3830 ventricular lead serial number [id number]  Abbott pulse generator serial number [id number]  The patient had no apparent, immediate complications.  Pacemaker interrogation demonstrated normal function.  Postop chest x-ray demonstrated no evidence of pneumothorax and normal lead placement.   The patient was felt to be stable and ready for discharge to home.  She will resume Xarelto on Thursday, 02/04/2023.  She will need wound check in [place] in 10 days and follow-up with Dr. Elberta Fortis in [place] in 90 days. The patient was started on potassium this admit for hypokalemia. Will get repeat BMET at follow up wound check.      Did the patient have an acute coronary syndrome (MI, NSTEMI, STEMI, etc) this admission?:  No. Elevated troponin due to demand ischemia.                                Did the patient have a percutaneous coronary intervention (stent / angioplasty)?:  No.      _____________  Discharge Vitals Blood pressure (!) 133/58, pulse 76, temperature 98.4 F (36.9 C), temperature source Oral, resp. rate (!) 30, height 5' (1.524 m), weight 93.2 kg, SpO2 96%.  Filed Weights   01/30/23 0027 01/30/23 1500 01/31/23 0447  Weight: 95.2 kg 94.6 kg 93.2 kg    Labs & Radiologic Studies    CBC Recent Labs    01/30/23 0044  WBC 6.2  HGB  10.5*  HCT 36.0  MCV 81.1  PLT 114*   Basic Metabolic Panel Recent Labs    84/69/62 0044 01/30/23 0327 01/31/23 1022  NA 138  --  137  K 3.4*  --  3.8  CL 104  --  105  CO2 23  --  23  GLUCOSE 160*  --  109*  BUN 11  --  14  CREATININE 1.07*  --  0.80  CALCIUM 8.5*  --  8.1*  MG  --  2.1  --     High Sensitivity Troponin:   Recent Labs  Lab 01/30/23 0044 01/30/23 0233  TROPONINIHS 245* 203*    _____________  CHEST - 2 VIEW 01/31/23 COMPARISON:  Chest radiograph dated 01/30/2023. FINDINGS: The heart is enlarged. Vascular calcifications are  seen in the aortic arch. An aortic valve prosthesis is redemonstrated. There has been interval placement of a left subclavian approach cardiac device with leads overlying the right atrium and right ventricle. There is mild bilateral interstitial opacities, similar to prior exam. No pleural effusion or pneumothorax. Degenerative changes are seen in the spine. IMPRESSION: Interval placement of a left subclavian approach cardiac device. No pneumothorax.  DG Chest 2 View  Result Date: 01/30/2023 CLINICAL DATA:  Preoperative evaluation for upcoming heart surgery EXAM: CHEST - 2 VIEW COMPARISON:  08/14/2020  FINDINGS: Cardiac shadow is enlarged. Mild central vascular congestion is noted. No significant edema is seen. No effusion is noted. No acute bony abnormality is noted.  IMPRESSION: Mild central vascular congestion.  Electronically Signed   By: Alcide Clever M.D.   On: 01/30/2023 02:58   CT Head Wo Contrast Result Date: 01/30/2023 CLINICAL DATA:  Recent syncopal episodes EXAM: CT HEAD WITHOUT CONTRAST TECHNIQUE: Contiguous axial images were obtained from the base of the skull through the vertex without intravenous contrast. RADIATION DOSE REDUCTION: This exam was performed according to the departmental dose-optimization program which includes automated exposure control, adjustment of the mA and/or kV according to patient size and/or use of iterative reconstruction technique. COMPARISON:  09/06/2012  FINDINGS: Brain: No evidence of acute infarction, hemorrhage, hydrocephalus, extra-axial collection or mass lesion/mass effect. Vascular: No hyperdense vessel or unexpected calcification. Skull: Normal. Negative for fracture or focal lesion. Sinuses/Orbits: No acute finding. Other: None.  IMPRESSION: No acute intracranial abnormality noted.  Electronically Signed   By: Alcide Clever M.D.   On: 01/30/2023 02:57   DG Chest 1 View Result Date: 01/30/2023 CLINICAL DATA:  Recent syncopal episode EXAM:  PORTABLE CHEST 1 VIEW COMPARISON:  01/22/2023  FINDINGS: Cardiac shadow is enlarged but stable. Aortic calcifications are seen. Changes of prior TAVR are now noted. Lungs are well aerated bilaterally. Mild central vascular congestion is noted with minimal interstitial edema. No bony abnormality is seen.  IMPRESSION: Mild CHF.  Electronically Signed   By: Alcide Clever M.D.   On: 01/30/2023 02:09    Disposition   Pt is being discharged home today in good condition.  Follow-up Plans & Appointments     Follow-up Information     [facility] Follow up.   Specialty: Cardiology Why: Device clinic for wound check in 7-10 days. The office will call. Contact information: [address], [address] [place] [address] [phone]        Regan Lemming, MD Follow up in 3 month(s).   Specialty: Cardiology Why: Follow up. Office will call. Contact information: [address] [place] [address] [phone]  Discharge Medications   Allergies as of 01/31/2023   No Known Allergies      Medication List     TAKE these medications    ALPRAZolam 0.25 MG tablet Commonly known as: XANAX Take 0.25 mg by mouth at bedtime.   amiodarone 200 MG tablet Commonly known as: PACERONE Take 0.5 tablets (100 mg total) by mouth 2 (two) times daily.   ezetimibe 10 MG tablet Commonly known as: ZETIA Take 1 tablet by mouth once daily   Jardiance 25 MG Tabs tablet Generic drug: empagliflozin Take 25 mg by mouth daily.   ketorolac 0.5 % ophthalmic solution Commonly known as: ACULAR Place 1 drop into the left eye 4 (four) times daily.   levothyroxine 88 MCG tablet Commonly known as: SYNTHROID Take 88 mcg by mouth daily before breakfast.   metFORMIN 850 MG tablet Commonly known as: GLUCOPHAGE Take 850 mg by mouth 3 (three) times daily.   metoprolol succinate 25 MG 24 hr tablet Commonly known as:  TOPROL-XL Take 1 tablet (25 mg total) by mouth daily.   nitroGLYCERIN 0.4 MG SL tablet Commonly known as: NITROSTAT Place 0.4 mg under the tongue every 5 (five) minutes as needed for chest pain.   potassium chloride SA 20 MEQ tablet Commonly known as: KLOR-CON M Take 1 tablet (20 mEq total) by mouth daily.   prednisoLONE acetate 1 % ophthalmic suspension Commonly known as: PRED FORTE Place 1 drop into the left eye 4 (four) times daily.   raloxifene 60 MG tablet Commonly known as: EVISTA Take 1 tablet by mouth once daily   rivaroxaban 20 MG Tabs tablet Commonly known as: XARELTO Take 1 tablet (20 mg total) by mouth daily with supper. Start taking on: February 04, 2023 What changed: These instructions start on February 04, 2023. If you are unsure what to do until then, ask your doctor or other care provider.   torsemide 20 MG tablet Commonly known as: DEMADEX Take 20 mg by mouth 2 (two) times daily.   traMADol 50 MG tablet Commonly known as: ULTRAM Take 50 mg by mouth 2 (two) times daily as needed for moderate pain.           Outstanding Labs/Studies   BMET at wound check appt.  Duration of Discharge Encounter   Greater than 30 minutes including physician time.  Signed, Tereso Newcomer, PA-C 01/31/2023, 3:43 PM

## 2023-02-01 ENCOUNTER — Encounter (HOSPITAL_COMMUNITY): Payer: Self-pay | Admitting: Internal Medicine

## 2023-02-01 ENCOUNTER — Telehealth (HOSPITAL_COMMUNITY): Payer: Self-pay

## 2023-02-01 MED FILL — Midazolam HCl Inj 2 MG/2ML (Base Equivalent): INTRAMUSCULAR | Qty: 4 | Status: AC

## 2023-02-01 NOTE — Telephone Encounter (Signed)
Per phase 1 fax cardiac rehab fax referral to [facility]

## 2023-02-02 DIAGNOSIS — I442 Atrioventricular block, complete: Secondary | ICD-10-CM | POA: Diagnosis not present

## 2023-02-02 DIAGNOSIS — I480 Paroxysmal atrial fibrillation: Secondary | ICD-10-CM | POA: Diagnosis not present

## 2023-02-02 DIAGNOSIS — E1169 Type 2 diabetes mellitus with other specified complication: Secondary | ICD-10-CM | POA: Diagnosis not present

## 2023-02-02 DIAGNOSIS — I5032 Chronic diastolic (congestive) heart failure: Secondary | ICD-10-CM | POA: Diagnosis not present

## 2023-02-02 DIAGNOSIS — N2889 Other specified disorders of kidney and ureter: Secondary | ICD-10-CM | POA: Diagnosis not present

## 2023-02-02 DIAGNOSIS — I350 Nonrheumatic aortic (valve) stenosis: Secondary | ICD-10-CM | POA: Diagnosis not present

## 2023-02-02 DIAGNOSIS — I10 Essential (primary) hypertension: Secondary | ICD-10-CM | POA: Diagnosis not present

## 2023-02-02 DIAGNOSIS — E785 Hyperlipidemia, unspecified: Secondary | ICD-10-CM | POA: Diagnosis not present

## 2023-02-03 ENCOUNTER — Ambulatory Visit: Payer: PPO | Attending: Interventional Cardiology | Admitting: Physician Assistant

## 2023-02-03 VITALS — BP 120/50 | HR 74 | Ht 61.0 in | Wt 204.4 lb

## 2023-02-03 DIAGNOSIS — I480 Paroxysmal atrial fibrillation: Secondary | ICD-10-CM | POA: Diagnosis not present

## 2023-02-03 DIAGNOSIS — I10 Essential (primary) hypertension: Secondary | ICD-10-CM

## 2023-02-03 DIAGNOSIS — Z950 Presence of cardiac pacemaker: Secondary | ICD-10-CM | POA: Diagnosis not present

## 2023-02-03 DIAGNOSIS — I5032 Chronic diastolic (congestive) heart failure: Secondary | ICD-10-CM

## 2023-02-03 DIAGNOSIS — E876 Hypokalemia: Secondary | ICD-10-CM | POA: Diagnosis not present

## 2023-02-03 DIAGNOSIS — N289 Disorder of kidney and ureter, unspecified: Secondary | ICD-10-CM | POA: Diagnosis not present

## 2023-02-03 DIAGNOSIS — N2889 Other specified disorders of kidney and ureter: Secondary | ICD-10-CM | POA: Diagnosis not present

## 2023-02-03 DIAGNOSIS — R918 Other nonspecific abnormal finding of lung field: Secondary | ICD-10-CM | POA: Diagnosis not present

## 2023-02-03 DIAGNOSIS — K769 Liver disease, unspecified: Secondary | ICD-10-CM | POA: Diagnosis not present

## 2023-02-03 DIAGNOSIS — Z952 Presence of prosthetic heart valve: Secondary | ICD-10-CM

## 2023-02-03 NOTE — Patient Instructions (Addendum)
Medication Instructions:  Your physician recommends that you continue on your current medications as directed. Please refer to the Current Medication list given to you today.  *If you need a refill on your cardiac medications before your next appointment, please call your pharmacy*   Lab Work: None ordered   If you have labs (blood work) drawn today and your tests are completely normal, you will receive your results only by: MyChart Message (if you have MyChart) OR A paper copy in the mail If you have any lab test that is abnormal or we need to change your treatment, we will call you to review the results.   Testing/Procedures: Your physician has requested that you have a ultrasound of your abdomen   Follow-Up: Follow up as scheduled   Other Instructions

## 2023-02-03 NOTE — Progress Notes (Signed)
[facility]   MULTIDISCIPLINARY HEART VALVE CLINIC                                     Cardiology Office Note:    Date:  02/03/2023   ID:  Crystal Ramos, DOB [birth year], MRN [id number]  PCP:  Paulina Fusi, MD  [facility] Cardiologist:  Garwin Brothers, MD  [facility] Electrophysiologist:  None   Referring MD: Paulina Fusi, MD   [facility] s/p TAVR  History of Present Illness:    Crystal Ramos is a 67 y.o. 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   Echo from [facility] on 01/27/23 showed EF 60% and severe AS with a mean grad 41 mmHg and AVA 0.48 cm2. [facility] 12/07/22 showed mild nonobstructive disease. She had elevated filling pressures with a mean RA pressure of 17 mmHg and a wedge pressure of 32 mmHg. She reported progressive DOE and exertional fatigue. She was evaluated by the multidisciplinary valve team and underwent successful TAVR with a 29 Evolut FX THV via the TF approach on 01/26/23. Post operative echo showed EF 55%, normally functioning TAVR with a mean gradient of 16.3 mmHg and no PVL. She had transient CHB after TAVR. She had no recurrent heart block for 48 hours and was sent home on 01/28/23 with a ZIO AT monitor. She presented back to the hospital on 01/30/2023 with syncope associated with a 12-second pause and now s/p St Jude dual-chamber pacemaker implantation 01/31/2023 with Dr. Graciela Husbands. Plan to follow with Dr. Elberta Fortis in [place].   Today the patient presents to clinic for follow up. Here alone. Doing a little more each day. Can tell a difference in breathing and LE edema. No CP or SOB. No orthopnea or PND. No dizziness or syncope since her second discharge. No blood in stool or urine. No palpitations.   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 Thukkani and Bartle   Severe aortic stenosis 12/02/2022   Type 2 diabetes mellitus without complication (HCC) 12/18/2015    Past Surgical History:  Procedure Laterality Date   CHOLECYSTECTOMY     INTRAOPERATIVE TRANSTHORACIC ECHOCARDIOGRAM  01/26/2023   Procedure: INTRAOPERATIVE TRANSTHORACIC ECHOCARDIOGRAM;  Surgeon: Orbie Pyo, MD;  Location: [facility] INVASIVE CV LAB;  Service: Open Heart Surgery;;   LEFT HEART CATH AND CORONARY ANGIOGRAPHY N/A 04/03/2019   Procedure: LEFT HEART CATH AND CORONARY ANGIOGRAPHY;  Surgeon: Verne Carrow  D, MD;  Location: [facility] INVASIVE CV LAB;  Service: Cardiovascular;  Laterality: N/A;   MEDIAL PARTIAL KNEE REPLACEMENT     PACEMAKER IMPLANT N/A 01/31/2023   Procedure: PACEMAKER IMPLANT;  Surgeon: Duke Salvia, MD;  Location: [facility] INVASIVE CV LAB;  Service: Cardiovascular;  Laterality: N/A;   RIGHT/LEFT HEART CATH AND CORONARY ANGIOGRAPHY N/A 12/07/2022   Procedure: RIGHT/LEFT HEART CATH AND CORONARY ANGIOGRAPHY;  Surgeon: Yvonne Kendall, MD;  Location: [facility] INVASIVE CV LAB;  Service: Cardiovascular;  Laterality: N/A;   TRANSCATHETER AORTIC VALVE REPLACEMENT,  TRANSFEMORAL Right 01/26/2023   Procedure: Transcatheter Aortic Valve Replacement, Transfemoral;  Surgeon: Orbie Pyo, MD;  Location: [facility] INVASIVE CV LAB;  Service: Open Heart Surgery;  Laterality: Right;    Current Medications: Current Meds  Medication Sig   ALPRAZolam (XANAX) 0.25 MG tablet Take 0.25 mg by mouth at bedtime.    amiodarone (PACERONE) 200 MG tablet Take 0.5 tablets (100 mg total) by mouth 2 (two) times daily.   ezetimibe (ZETIA) 10 MG tablet Take 1 tablet by mouth once daily   JARDIANCE 25 MG TABS tablet Take 25 mg by mouth daily.   ketorolac (ACULAR) 0.5 % ophthalmic solution Place 1 drop into the left eye 4 (four) times daily.   levothyroxine (SYNTHROID) 88 MCG tablet Take 88 mcg by mouth daily before breakfast.   metFORMIN (GLUCOPHAGE) 850 MG tablet Take 850 mg by mouth 3 (three) times daily.   metoprolol succinate (TOPROL-XL) 25 MG 24 hr tablet Take 1 tablet (25 mg total) by mouth daily.   nitroGLYCERIN (NITROSTAT) 0.4 MG SL tablet Place 0.4 mg under the tongue every 5 (five) minutes as needed for chest pain.   potassium chloride SA (KLOR-CON M) 20 MEQ tablet Take 1 tablet (20 mEq total) by mouth daily.   prednisoLONE acetate (PRED FORTE) 1 % ophthalmic suspension Place 1 drop into the left eye 4 (four) times daily.   raloxifene (EVISTA) 60 MG tablet Take 1 tablet by mouth once daily   [START ON 02/04/2023] rivaroxaban (XARELTO) 20 MG TABS tablet Take 1 tablet (20 mg total) by mouth daily with supper.   torsemide (DEMADEX) 20 MG tablet Take 20 mg by mouth 2 (two) times daily.   traMADol (ULTRAM) 50 MG tablet Take 50 mg by mouth 2 (two) times daily as needed for moderate pain.      ROS:   Please see the history of present illness.    All other systems reviewed and are negative.  EKGs   EKG:  EKG is NOT ordered today.   Recent Labs: 11/20/2022: NT-Pro BNP 280 01/22/2023: ALT 18 01/30/2023: Hemoglobin 10.5; Magnesium 2.1; Platelets 114 01/31/2023: BUN 14;  Creatinine, Ser 0.80; Potassium 3.8; Sodium 137  Recent Lipid Panel    Component Value Date/Time   CHOL 164 01/12/2022 0904   TRIG 69 01/12/2022 0904   HDL 56 01/12/2022 0904   CHOLHDL 2.9 01/12/2022 0904   LDLCALC 95 01/12/2022 0904     Risk Assessment/Calculations:            Physical Exam:    VS:  BP (!) 120/50   Pulse 74   Ht 5\' 1"  (1.549 m)   Wt 204 lb 6.4 oz (92.7 kg)   SpO2 95%   BMI 38.62 kg/m     Wt Readings from Last 3 Encounters:  02/03/23 204 lb 6.4 oz (92.7 kg)  01/31/23 205 lb 6.4 oz (93.2 kg)  01/28/23 209 lb 12.8 oz (95.2 kg)  GEN: Well nourished, well developed in no acute distress NECK: No JVD; No carotid bruits CARDIAC: RRR, no murmurs, rubs, gallops RESPIRATORY:  Clear to auscultation without rales, wheezing or rhonchi  ABDOMEN: Soft, non-tender, non-distended EXTREMITIES:  No edema; No deformity   ASSESSMENT:    1. S/P TAVR (transcatheter aortic valve replacement)   2. Pacemaker   3. Essential hypertension   4. Paroxysmal atrial fibrillation (HCC)   5. Chronic heart failure with preserved ejection fraction (HCC)   6. Hypokalemia   7. Abnormal CT scan of lung   8. Renal lesion   9. Other specified disorders of kidney and ureter   10. Liver disease    PLAN:    In order of problems listed above:  Severe AS s/p TAVR: doing well 1 week out from TAVR. Groin sites stable. Continue Xarelto 20mg  daily.  SBE prophylaxis discussed; the patient is edentulous and does not go to the dentist. I will see her back for 1 month follow up and echo.   CHB s/p PPM: wound check 10/2. Will follow with Dr. Elberta Fortis in [place].    HTN: BP well controlled. No changes made today.   PAF: continue amiodarone 100mg  BID, Toprol XL 25mg  daily and Xarelto 20mg  daily.   HFpEF: she is euvolemic today. Continue torsemide 20mg  BID and Jardiance 10mg  daily.    Hypokalemia: repleted during recent admission and started on Kdur daily. Plan for BMET at wound  check.    Abnormal lung CT: pre TAVR CTs showed "more focal consolidation is seen in the superior portion of the right lower lobe, could also be due to pulmonary edema, although underlying pulmonary nodule can not be excluded. Recommend follow-up chest CT 3 months to ensure resolution." Will order this at 1 month visit.    Renal lesion: pre TAVR CT showed an "Indeterminate lesion of the lower pole of the left kidney measuring 13 mm, new when compared with prior abdomen CT. Recommend further evaluation with contrast-enhanced renal protocol MRI or CT." Cannot get an MRI at our institution given pacemaker with mixed leads. Will get her set up for renal protocol CT.   Liver disease: pre TAVR CT showed "cirrhotic liver morphology with sequela of portal hypertension including large retroperitoneal portosystemic shunt." Discussed with pt today and she will follow up with PCP.   Medication Adjustments/Labs and Tests Ordered: Current medicines are reviewed at length with the patient today.  Concerns regarding medicines are outlined above.  Orders Placed This Encounter  Procedures   CT RENAL ABD W/WO   No orders of the defined types were placed in this encounter.   Patient Instructions  Medication Instructions:  Your physician recommends that you continue on your current medications as directed. Please refer to the Current Medication list given to you today.  *If you need a refill on your cardiac medications before your next appointment, please call your pharmacy*   Lab Work: None ordered   If you have labs (blood work) drawn today and your tests are completely normal, you will receive your results only by: MyChart Message (if you have MyChart) OR A paper copy in the mail If you have any lab test that is abnormal or we need to change your treatment, we will call you to review the results.   Testing/Procedures: Your physician has requested that you have a ultrasound of your abdomen    Follow-Up: Follow up as scheduled   Other Instructions     Signed, Cline Crock, PA-C  02/03/2023  4:21 PM    [facility]

## 2023-02-05 ENCOUNTER — Ambulatory Visit: Payer: PPO

## 2023-02-05 ENCOUNTER — Telehealth: Payer: Self-pay

## 2023-02-05 ENCOUNTER — Telehealth: Payer: Self-pay | Admitting: Cardiology

## 2023-02-05 NOTE — Telephone Encounter (Signed)
   Pt c/o of Chest Pain: STAT if active CP, including tightness, pressure, jaw pain, radiating pain to shoulder/upper arm/back, CP unrelieved by Nitro. Symptoms reported of SOB, nausea, vomiting, sweating.  1. Are you having CP right now? No     2. Are you experiencing any other symptoms (ex. SOB, nausea, vomiting, sweating)? Headache that slowly worsens   3. Is your CP continuous or coming and going? Coming and going with headache    4. Have you taken Nitroglycerin? No    5. How long have you been experiencing CP? Started a day or two after pacemaker was placed    6. If NO CP at time of call then end call with telling Pt to call back or call 911 if Chest pain returns prior to return call from triage team.   States it is pressure not pain.   Is more concerned with the headache since pacemaker placement.  Please advise.

## 2023-02-05 NOTE — Telephone Encounter (Signed)
Pt reports her HA probably started yesterday. Comes and goes. Aware that it is not related to her PPM that was implanted 5 days ago. Dicussed w/ Otilio Saber, PA.  Pt aware she may be dehydrated and to ensure she is staying hydrated. Informed that she can take Tylenol for the pain. Advised to follow up with PCP if continues Patient verbalized understanding and agreeable to plan.

## 2023-02-05 NOTE — Telephone Encounter (Signed)
Pt called in stating she has been having lightheadedness since her implant

## 2023-02-05 NOTE — Telephone Encounter (Signed)
Outreach made to Pt.  She is concerned that she has had a "light" headache since her pacemaker implant.  Attempting to get a remote transmission at this time.  Will continue to follow.

## 2023-02-05 NOTE — Telephone Encounter (Signed)
Transmission received and WNL.  Advised Pt she may just have a headache d/t recovering from 2 cardiac procedures in the last 10 days.  Advised ok to take tylenol as needed for her headache.  Pt agrees.  She will continue to rest and recover and call back with any further concerns.  Direct number to device clinic given.

## 2023-02-17 ENCOUNTER — Ambulatory Visit (HOSPITAL_COMMUNITY)
Admission: RE | Admit: 2023-02-17 | Discharge: 2023-02-17 | Disposition: A | Discharge status: Home / Self Care | Payer: PPO | Source: Ambulatory Visit | Attending: Physician Assistant | Admitting: Physician Assistant

## 2023-02-17 ENCOUNTER — Ambulatory Visit (INDEPENDENT_AMBULATORY_CARE_PROVIDER_SITE_OTHER): Payer: PPO

## 2023-02-17 ENCOUNTER — Ambulatory Visit: Payer: PPO

## 2023-02-17 DIAGNOSIS — I442 Atrioventricular block, complete: Secondary | ICD-10-CM | POA: Diagnosis not present

## 2023-02-17 DIAGNOSIS — E876 Hypokalemia: Secondary | ICD-10-CM | POA: Insufficient documentation

## 2023-02-17 DIAGNOSIS — K7460 Unspecified cirrhosis of liver: Secondary | ICD-10-CM | POA: Diagnosis not present

## 2023-02-17 DIAGNOSIS — K5730 Diverticulosis of large intestine without perforation or abscess without bleeding: Secondary | ICD-10-CM | POA: Diagnosis not present

## 2023-02-17 DIAGNOSIS — N2889 Other specified disorders of kidney and ureter: Secondary | ICD-10-CM | POA: Insufficient documentation

## 2023-02-17 DIAGNOSIS — K7689 Other specified diseases of liver: Secondary | ICD-10-CM | POA: Diagnosis not present

## 2023-02-17 LAB — CUP PACEART INCLINIC DEVICE CHECK
Battery Remaining Longevity: 67 mo
Battery Voltage: 3.05 V
Brady Statistic RA Percent Paced: 55 %
Brady Statistic RV Percent Paced: 99.87 %
Date Time Interrogation Session: 20241002135659
Implantable Lead Connection Status: 753985
Implantable Lead Connection Status: 753985
Implantable Lead Implant Date: 20240915
Implantable Lead Implant Date: 20240915
Implantable Lead Location: 753859
Implantable Lead Location: 753860
Implantable Lead Model: 3830
Implantable Pulse Generator Implant Date: 20240915
Lead Channel Impedance Value: 425 Ohm
Lead Channel Impedance Value: 612.5 Ohm
Lead Channel Pacing Threshold Amplitude: 1 V
Lead Channel Pacing Threshold Amplitude: 1 V
Lead Channel Pacing Threshold Amplitude: 1 V
Lead Channel Pacing Threshold Amplitude: 1 V
Lead Channel Pacing Threshold Pulse Width: 0.5 ms
Lead Channel Pacing Threshold Pulse Width: 0.5 ms
Lead Channel Pacing Threshold Pulse Width: 0.5 ms
Lead Channel Pacing Threshold Pulse Width: 0.5 ms
Lead Channel Sensing Intrinsic Amplitude: 2.8 mV
Lead Channel Sensing Intrinsic Amplitude: 6.5 mV
Lead Channel Setting Pacing Amplitude: 3.5 V
Lead Channel Setting Pacing Amplitude: 3.5 V
Lead Channel Setting Pacing Pulse Width: 0.5 ms
Lead Channel Setting Sensing Sensitivity: 2 mV
Pulse Gen Model: 2272
Pulse Gen Serial Number: 8213048

## 2023-02-17 MED ORDER — IOHEXOL 350 MG/ML SOLN
75.0000 mL | Freq: Once | INTRAVENOUS | Status: AC | PRN
  Administered 2023-02-17: 75 mL via INTRAVENOUS

## 2023-02-17 NOTE — Progress Notes (Signed)
Wound check appointment. Dermabond removed. Wound without redness or edema. Incision edges approximated, wound well healed. Normal device function. Thresholds, sensing, and impedances consistent with implant measurements. Device programmed at 3.5V/auto capture programmed on for extra safety margin until 3 month visit. Histogram distribution appropriate for patient and level of activity. No mode switches or high ventricular rates noted. Patient educated about wound care, arm mobility, lifting restrictions. ROV in 3 months with implanting physician. ?

## 2023-02-17 NOTE — Patient Instructions (Signed)

## 2023-02-18 ENCOUNTER — Telehealth: Payer: Self-pay | Admitting: Cardiology

## 2023-02-18 LAB — BASIC METABOLIC PANEL
BUN/Creatinine Ratio: 13 (ref 12–28)
BUN: 11 mg/dL (ref 8–27)
CO2: 24 mmol/L (ref 20–29)
Calcium: 9.2 mg/dL (ref 8.7–10.3)
Chloride: 103 mmol/L (ref 96–106)
Creatinine, Ser: 0.82 mg/dL (ref 0.57–1.00)
Glucose: 88 mg/dL (ref 70–99)
Potassium: 4.5 mmol/L (ref 3.5–5.2)
Sodium: 141 mmol/L (ref 134–144)
eGFR: 77 mL/min/{1.73_m2} (ref >59)

## 2023-02-18 NOTE — Telephone Encounter (Signed)
Beatrice Lecher, PA-C 02/18/2023  7:51 AM EDT     Kidney function (creatinine), potassium are normal. PLAN: - Continue current medications/treatment plan and follow up as scheduled. Tereso Newcomer, PA-C   02/18/2023 7:51 AM      Tried calling the pt back and she didn't answer and phone continued to ring.

## 2023-02-18 NOTE — Telephone Encounter (Signed)
Patient returned RN's call regarding results. 

## 2023-02-19 NOTE — Progress Notes (Signed)
Pt has been made aware of normal result and verbalized understanding.  jw

## 2023-02-24 ENCOUNTER — Ambulatory Visit (HOSPITAL_COMMUNITY): Payer: PPO | Attending: Cardiovascular Disease | Admitting: Physician Assistant

## 2023-02-24 ENCOUNTER — Ambulatory Visit (HOSPITAL_BASED_OUTPATIENT_CLINIC_OR_DEPARTMENT_OTHER): Payer: PPO

## 2023-02-24 VITALS — BP 130/60 | HR 60 | Ht 61.0 in | Wt 209.4 lb

## 2023-02-24 DIAGNOSIS — Z950 Presence of cardiac pacemaker: Secondary | ICD-10-CM | POA: Diagnosis not present

## 2023-02-24 DIAGNOSIS — I5032 Chronic diastolic (congestive) heart failure: Secondary | ICD-10-CM | POA: Diagnosis not present

## 2023-02-24 DIAGNOSIS — R928 Other abnormal and inconclusive findings on diagnostic imaging of breast: Secondary | ICD-10-CM | POA: Diagnosis not present

## 2023-02-24 DIAGNOSIS — N289 Disorder of kidney and ureter, unspecified: Secondary | ICD-10-CM | POA: Insufficient documentation

## 2023-02-24 DIAGNOSIS — K769 Liver disease, unspecified: Secondary | ICD-10-CM | POA: Diagnosis not present

## 2023-02-24 DIAGNOSIS — E876 Hypokalemia: Secondary | ICD-10-CM | POA: Diagnosis not present

## 2023-02-24 DIAGNOSIS — R921 Mammographic calcification found on diagnostic imaging of breast: Secondary | ICD-10-CM | POA: Diagnosis not present

## 2023-02-24 DIAGNOSIS — I10 Essential (primary) hypertension: Secondary | ICD-10-CM | POA: Diagnosis not present

## 2023-02-24 DIAGNOSIS — R911 Solitary pulmonary nodule: Secondary | ICD-10-CM | POA: Insufficient documentation

## 2023-02-24 DIAGNOSIS — I480 Paroxysmal atrial fibrillation: Secondary | ICD-10-CM | POA: Diagnosis not present

## 2023-02-24 DIAGNOSIS — Z952 Presence of prosthetic heart valve: Secondary | ICD-10-CM | POA: Insufficient documentation

## 2023-02-24 DIAGNOSIS — R92322 Mammographic fibroglandular density, left breast: Secondary | ICD-10-CM | POA: Diagnosis not present

## 2023-02-24 DIAGNOSIS — R918 Other nonspecific abnormal finding of lung field: Secondary | ICD-10-CM

## 2023-02-24 MED ORDER — PERFLUTREN LIPID MICROSPHERE
3.0000 mL | INTRAVENOUS | Status: AC | PRN
Start: 2023-02-24 — End: 2023-02-24
  Administered 2023-02-24: 3 mL via INTRAVENOUS

## 2023-02-24 NOTE — Progress Notes (Signed)
[facility]   MULTIDISCIPLINARY HEART VALVE CLINIC                                     Cardiology Office Note:    Date:  02/26/2023   ID:  Crystal Ramos, DOB 1955-05-26, MRN [id number]  PCP:  Paulina Fusi, MD  [facility] Cardiologist:  Garwin Brothers, MD / Dr. Lynnette Caffey & Dr. Laneta Simmers (TAVR)  [facility] Electrophysiologist:  None   Referring MD: Paulina Fusi, MD   1 month s/p TAVR  History of Present Illness:    Crystal Ramos is a 67 y.o. 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   Echo from [facility] on 01/27/23 showed EF 60% and severe AS with a mean grad 41 mmHg and AVA 0.48 cm2. [facility] 12/07/22 showed mild nonobstructive disease. She had elevated filling pressures with a mean RA pressure of 17 mmHg and a wedge pressure of 32 mmHg. She reported progressive DOE and exertional fatigue. She was evaluated by the multidisciplinary valve team and underwent successful TAVR with a 29 Evolut FX THV via the TF approach on 01/26/23. Post operative echo showed EF 55%, normally functioning TAVR with a mean gradient of 16.3 mmHg and no PVL. She had transient CHB after TAVR. She had no recurrent heart block for 48 hours and was sent home on 01/28/23 with a ZIO AT monitor. She presented back to the hospital on 01/30/2023 with syncope associated with a 12-second pause and now s/p St Jude dual-chamber pacemaker implantation 01/31/2023 with Dr. Graciela Husbands. Plan to follow with Dr. Elberta Fortis in [place].   Today the patient presents to clinic for follow up. Here alone. Feels like her old self again. No CP or SOB. No orthopnea or PND. No dizziness or syncope since her second discharge. No blood in stool or urine. No palpitations.   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 Thukkani and Bartle   Severe aortic stenosis 12/02/2022   Type 2 diabetes mellitus without complication (HCC) 12/18/2015    Past Surgical History:  Procedure Laterality Date   CHOLECYSTECTOMY     INTRAOPERATIVE TRANSTHORACIC ECHOCARDIOGRAM  01/26/2023   Procedure: INTRAOPERATIVE TRANSTHORACIC ECHOCARDIOGRAM;  Surgeon: Orbie Pyo, MD;  Location: [facility] INVASIVE CV LAB;  Service: Open Heart Surgery;;   LEFT HEART CATH AND CORONARY ANGIOGRAPHY N/A 04/03/2019   Procedure: LEFT HEART CATH AND CORONARY ANGIOGRAPHY;  Surgeon: Kathleene Hazel,  MD;  Location: [facility] INVASIVE CV LAB;  Service: Cardiovascular;  Laterality: N/A;   MEDIAL PARTIAL KNEE REPLACEMENT     PACEMAKER IMPLANT N/A 01/31/2023   Procedure: PACEMAKER IMPLANT;  Surgeon: Duke Salvia, MD;  Location: [facility] INVASIVE CV LAB;  Service: Cardiovascular;  Laterality: N/A;   RIGHT/LEFT HEART CATH AND CORONARY ANGIOGRAPHY N/A 12/07/2022   Procedure: RIGHT/LEFT HEART CATH AND CORONARY ANGIOGRAPHY;  Surgeon: Yvonne Kendall, MD;  Location: [facility] INVASIVE CV LAB;  Service: Cardiovascular;  Laterality: N/A;   TRANSCATHETER AORTIC VALVE REPLACEMENT, TRANSFEMORAL Right  01/26/2023   Procedure: Transcatheter Aortic Valve Replacement, Transfemoral;  Surgeon: Orbie Pyo, MD;  Location: [facility] INVASIVE CV LAB;  Service: Open Heart Surgery;  Laterality: Right;    Current Medications: Current Meds  Medication Sig   ALPRAZolam (XANAX) 0.25 MG tablet Take 0.25 mg by mouth at bedtime.    amiodarone (PACERONE) 200 MG tablet Take 0.5 tablets (100 mg total) by mouth 2 (two) times daily.   ezetimibe (ZETIA) 10 MG tablet Take 1 tablet by mouth once daily   JARDIANCE 25 MG TABS tablet Take 25 mg by mouth daily.   ketorolac (ACULAR) 0.5 % ophthalmic solution Place 1 drop into the left eye 4 (four) times daily.   levothyroxine (SYNTHROID) 88 MCG tablet Take 88 mcg by mouth daily before breakfast.   metFORMIN (GLUCOPHAGE) 850 MG tablet Take 850 mg by mouth 3 (three) times daily.   metoprolol succinate (TOPROL-XL) 25 MG 24 hr tablet Take 1 tablet (25 mg total) by mouth daily.   nitroGLYCERIN (NITROSTAT) 0.4 MG SL tablet Place 0.4 mg under the tongue every 5 (five) minutes as needed for chest pain.   potassium chloride SA (KLOR-CON M) 20 MEQ tablet Take 1 tablet (20 mEq total) by mouth daily.   raloxifene (EVISTA) 60 MG tablet Take 1 tablet by mouth once daily   rivaroxaban (XARELTO) 20 MG TABS tablet Take 1 tablet (20 mg total) by mouth daily with supper.   torsemide (DEMADEX) 20 MG tablet Take 20 mg by mouth 2 (two) times daily.   traMADol (ULTRAM) 50 MG tablet Take 50 mg by mouth 2 (two) times daily as needed for moderate pain.   [DISCONTINUED] prednisoLONE acetate (PRED FORTE) 1 % ophthalmic suspension Place 1 drop into the left eye 4 (four) times daily.      ROS:   Please see the history of present illness.    All other systems reviewed and are negative.  EKGs   EKG:  EKG is NOT ordered today.   Recent Labs: 11/20/2022: NT-Pro BNP 280 01/22/2023: ALT 18 01/30/2023: Hemoglobin 10.5; Magnesium 2.1; Platelets 114 02/17/2023: BUN 11; Creatinine, Ser 0.82; Potassium  4.5; Sodium 141  Recent Lipid Panel    Component Value Date/Time   CHOL 164 01/12/2022 0904   TRIG 69 01/12/2022 0904   HDL 56 01/12/2022 0904   CHOLHDL 2.9 01/12/2022 0904   LDLCALC 95 01/12/2022 0904     Risk Assessment/Calculations:            Physical Exam:    VS:  BP 130/60   Pulse 60   Ht 5\' 1"  (1.549 m)   Wt 209 lb 6.4 oz (95 kg)   SpO2 94%   BMI 39.57 kg/m     Wt Readings from Last 3 Encounters:  02/24/23 209 lb 6.4 oz (95 kg)  02/03/23 204 lb 6.4 oz (92.7 kg)  01/31/23 205 lb 6.4 oz (93.2 kg)     GEN: Well nourished, well  developed in no acute distress NECK: No JVD; No carotid bruits CARDIAC: RRR, soft flow murmur. No rubs, gallops RESPIRATORY:  Clear to auscultation without rales, wheezing or rhonchi  ABDOMEN: Soft, non-tender, non-distended EXTREMITIES:  No edema; No deformity   ASSESSMENT:    1. S/P TAVR (transcatheter aortic valve replacement)   2. Pacemaker   3. Essential hypertension   4. Paroxysmal atrial fibrillation (HCC)   5. Chronic heart failure with preserved ejection fraction (HCC)   6. Hypokalemia   7. Pulmonary nodule   8. Renal lesion   9. Liver disease     PLAN:    In order of problems listed above:  Severe AS s/p TAVR: echo today shows EF 55%, normally functioning TAVR with a mean gradient of 11.3 mm hg and trivial posterior PVL as well as mild MR. She has NYHA class I symptoms. Continue Xarelto 20mg  daily. SBE prophylaxis discussed; the patient is edentulous and does not go to the dentist. I will see her back for 1 year follow up and echo.   CHB s/p PPM: will follow with Dr. Elberta Fortis in [place].    HTN: BP well controlled. No changes made today.   PAF: continue amiodarone 100mg  BID, Toprol XL 25mg  daily and Xarelto 20mg  daily.   HFpEF: she is euvolemic today. Continue torsemide 20mg  BID and Jardiance 10mg  daily.    Hypokalemia: repleted during recent admission and started on Kdur daily. Follow up BMET showed K  4.5   Abnormal lung CT: pre TAVR CTs showed "more focal consolidation is seen in the superior portion of the right lower lobe, could also be due to pulmonary edema, although underlying pulmonary nodule can not be excluded. Recommend follow-up chest CT 3 months to ensure resolution." Will order this today to be done in late November.   Renal lesion: pre TAVR CT showed a renal lesion. 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 Urology consultation is suggested. She would like to be referred to a urologist in [place]. This has been sent.   Liver disease: pre TAVR CT showed "cirrhotic liver morphology with sequela of portal hypertension including large retroperitoneal portosystemic shunt." Discussed with pt today and she will follow up with PCP.   Medication Adjustments/Labs and Tests Ordered: Current medicines are reviewed at length with the patient today.  Concerns regarding medicines are outlined above.  Orders Placed This Encounter  Procedures   CT Chest Wo Contrast   Ambulatory referral to Urology   No orders of the defined types were placed in this encounter.   Patient Instructions  Medication Instructions:  Your physician recommends that you continue on your current medications as directed. Please refer to the Current Medication list given to you today.  *If you need a refill on your cardiac medications before your next appointment, please call your pharmacy*  Lab Work: None ordered If you have labs (blood work) drawn today and your tests are completely normal, you will receive your results only by: MyChart Message (if you have MyChart) OR A paper copy in the mail If you have any lab test that is abnormal or we need to change your treatment, we will call you to review the results.  Testing/Procedures: Your provider has recommended that you have a chest CT Angiography (CTA) in 6  weeks. This is a special type of CT scan that uses a computer to produce multi-dimensional views of major blood  vessels throughout the body. In CT angiography, a contrast material is injected through an IV to help visualize the blood vessels    Follow-Up: At [facility], you and your health needs are our priority.  As part of our continuing mission to provide you with exceptional heart care, we have created designated Provider Care Teams.  These Care Teams include your primary Cardiologist (physician) and Advanced Practice Providers (APPs -  Physician Assistants and Nurse Practitioners) who all work together to provide you with the care you need, when you need it.  We recommend signing up for the patient portal called "MyChart".  Sign up information is provided on this After Visit Summary.  MyChart is used to connect with patients for Virtual Visits (Telemedicine).  Patients are able to view lab/test results, encounter notes, upcoming appointments, etc.  Non-urgent messages can be sent to your provider as well.   To learn more about what you can do with MyChart, go to [email or web address].    Your next appointment:   As scheduled  Other Instructions You have been referred to urology     Signed, Cline Crock, PA-C  02/26/2023 8:19 AM    [facility]

## 2023-02-24 NOTE — Patient Instructions (Addendum)
Medication Instructions:  Your physician recommends that you continue on your current medications as directed. Please refer to the Current Medication list given to you today.  *If you need a refill on your cardiac medications before your next appointment, please call your pharmacy*  Lab Work: None ordered If you have labs (blood work) drawn today and your tests are completely normal, you will receive your results only by: MyChart Message (if you have MyChart) OR A paper copy in the mail If you have any lab test that is abnormal or we need to change your treatment, we will call you to review the results.  Testing/Procedures: Your provider has recommended that you have a chest CT Angiography (CTA) in 6 weeks. This is a special type of CT scan that uses a computer to produce multi-dimensional views of major blood vessels throughout the body. In CT angiography, a contrast material is injected through an IV to help visualize the blood vessels    Follow-Up: At [facility], you and your health needs are our priority.  As part of our continuing mission to provide you with exceptional heart care, we have created designated Provider Care Teams.  These Care Teams include your primary Cardiologist (physician) and Advanced Practice Providers (APPs -  Physician Assistants and Nurse Practitioners) who all work together to provide you with the care you need, when you need it.  We recommend signing up for the patient portal called "MyChart".  Sign up information is provided on this After Visit Summary.  MyChart is used to connect with patients for Virtual Visits (Telemedicine).  Patients are able to view lab/test results, encounter notes, upcoming appointments, etc.  Non-urgent messages can be sent to your provider as well.   To learn more about what you can do with MyChart, go to [email or web address].    Your next appointment:   As scheduled  Other Instructions You have been referred to  urology

## 2023-02-24 NOTE — Telephone Encounter (Signed)
See result note, pt had been made aware before this encounter was sent to me.

## 2023-02-25 LAB — ECHOCARDIOGRAM COMPLETE
AR max vel: 1.43 cm2
AV Area VTI: 1.43 cm2
AV Area mean vel: 1.55 cm2
AV Mean grad: 11.3 mm[Hg]
AV Peak grad: 20.9 mm[Hg]
Ao pk vel: 2.28 m/s
Area-P 1/2: 3.26 cm2
S' Lateral: 3.8 cm

## 2023-03-03 DIAGNOSIS — Z23 Encounter for immunization: Secondary | ICD-10-CM | POA: Diagnosis not present

## 2023-03-03 DIAGNOSIS — K7469 Other cirrhosis of liver: Secondary | ICD-10-CM | POA: Diagnosis not present

## 2023-03-11 DIAGNOSIS — R928 Other abnormal and inconclusive findings on diagnostic imaging of breast: Secondary | ICD-10-CM | POA: Diagnosis not present

## 2023-03-11 DIAGNOSIS — R921 Mammographic calcification found on diagnostic imaging of breast: Secondary | ICD-10-CM | POA: Diagnosis not present

## 2023-03-19 DIAGNOSIS — D0511 Intraductal carcinoma in situ of right breast: Secondary | ICD-10-CM | POA: Diagnosis not present

## 2023-03-21 NOTE — Progress Notes (Unsigned)
Cardiology Office Note:  .   Date:  03/22/2023  ID:  Crystal Ramos, DOB 1955/08/13, MRN [id number] PCP: Paulina Fusi, MD  [facility] Providers Cardiologist:  Garwin Brothers, MD    History of Present Illness: Marland Kitchen   Crystal Ramos is a 67 y.o. female with a past medical history of hypertension, PAF on Xarelto, chronic diastolic heart failure, severe aortic stenosis s/p TAVR on 01/26/2023, complete heart block s/p PPM 01/31/2023, unobstructive CAD per coronary CTA in 2024, DM 2, dyslipidemia, obesity.  02/17/2019 for in-clinic device check revealed stable lead parameters and battery 01/31/2023 s/p PPM Saint Jude dual-chamber pacemaker 01/26/2023 TAVR with a 29 29 Evolut FX THV via the TF approach  12/07/2022 left right heart cath mild nonobstructive CAD 11/26/2022 echo EF 60%, severe AAS with a mean gradient 41 mmHg  She underwent TAVR on 01/26/2023 as she had progressive DOE and exertional fatigue.  She experienced CHB after TAVR however had no recurrent heart block for 48 hours and was sent home with a live monitor.  She presented back to the hospital 01/30/2023 with syncope as had with a 12-second pause, ultimately underwent PPM implantation on 01/31/2023.  Evaluated by the structural heart team on 02/24/2023 for 1 month postop TAVR, she reported that she was feeling well, felt like she was back to her baseline.  She presents today for follow-up of hypertension, PAF, dyslipidemia.  She states overall she has been doing well, still bothered by some intermittent dizziness that has overall been unchanged since she underwent PPM implantation.  She denies hematochezia, hematuria, hemoptysis.  She has not being worked up for a mass that was noted on her kidney and on her liver.  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 She denies chest pain, palpitations, dyspnea, pnd, orthopnea, n, v, syncope, edema, weight gain, or  early satiety.    ROS: Review of Systems  Constitutional: Negative.   Cardiovascular:  Positive for leg swelling.  Neurological:  Positive for dizziness.     Studies Reviewed: .        Cardiac Studies & Procedures   CARDIAC CATHETERIZATION  CARDIAC CATHETERIZATION 12/07/2022  Narrative Conclusions: Mild to moderate, non-obstructive coronary artery disease with up to 50% stenosis of the mid LAD. Mildly to moderately elevated left heart filling pressures (LVEDP 20 mmHg, PCWP 32 mm).  Prominent v-waves on PCWP tracing and discrepancy in pressures between PCWP and LVEDP suggest underling mitral valve disease. Severely elevated right heart and pulmonary artery pressures (RA 17 mmHg, mean PA 48 mmHg). Normal Fick cardiac output/index. Moderate to severe aortic valve stenosis (mean gradient 36 mmHg, valve area 0.8 cm^2).  Recommendations: Escalate diuresis. Restart rivaroxaban tomorrow if no evidence of bleeding/vascular injury at catheterization sites. Consultation with structural heart and advanced heart failure teams should be considered.  Yvonne Kendall, MD [facility]  Findings Coronary Findings Diagnostic  Dominance: Right  Left Main Vessel is large. Vessel is angiographically normal.  Left Anterior Descending Vessel is large. Mid LAD lesion is 50% stenosed. The lesion is irregular. The lesion is mildly calcified.  First Diagonal Branch Vessel is moderate in size.  Ramus Intermedius Vessel is moderate in size. Ramus lesion is 30% stenosed.  Left Circumflex Vessel is large. Ost Cx to Prox Cx lesion is 30% stenosed.  First Obtuse Marginal Branch Vessel is small in size.  Second Obtuse Marginal Branch Vessel is moderate in  size.  Third Obtuse Marginal Branch Vessel is moderate in size.  Right Coronary Artery Vessel is large. Ost RCA lesion is 30% stenosed.  Right Ventricular Branch RV Branch lesion is 95% stenosed.  Intervention  No  interventions have been documented.   CARDIAC CATHETERIZATION  CARDIAC CATHETERIZATION 04/03/2019  Narrative  RV Branch lesion is 95% stenosed.  Ost Cx to Prox Cx lesion is 30% stenosed.  Ramus lesion is 30% stenosed.  Mid LAD-1 lesion is 30% stenosed.  Mid LAD-2 lesion is 50% stenosed.  Mid LAD to Dist LAD lesion is 30% stenosed.  1. Mild to moderate non-obstructive disease in the mid LAD 2. Mild non-obstructive disease in the intermediate branch and the Circumflex 3. The RCA is a large dominant vessel with no obstructive in the major segment or distal branches. The small RV marginal branch has a 95% ostial stenosis (too small for PCI). 4. Elevated LVEDP  Recommendations: Medical management of CAD. I would increase her Lasix dosage to 40 mg po BID. She was given Lasix 40 mg IV x 1 in the cath lab.  Findings Coronary Findings Diagnostic  Dominance: Right  Left Anterior Descending Vessel is large. Mid LAD-1 lesion is 30% stenosed. Mid LAD-2 lesion is 50% stenosed. Mid LAD to Dist LAD lesion is 30% stenosed.  Ramus Intermedius Vessel is moderate in size. Ramus lesion is 30% stenosed.  Left Circumflex Vessel is large. Ost Cx to Prox Cx lesion is 30% stenosed.  Right Coronary Artery Vessel is large.  Right Ventricular Branch RV Branch lesion is 95% stenosed.  Intervention  No interventions have been documented.     ECHOCARDIOGRAM  ECHOCARDIOGRAM COMPLETE 02/24/2023  Narrative ECHOCARDIOGRAM REPORT    Patient Name:   Crystal Ramos Date of Exam: 02/24/2023 Medical Rec #:  [id number]            Height:       61.0 in Accession #:    [id number]           Weight:       204.4 lb Date of Birth:  Mar 11, 1956            BSA:          1.906 m Patient Age:    67 years             BP:           120/50 mmHg Patient Gender: F                    HR:           62 bpm. Exam Location:  [address]  Procedure: 2D Echo, Cardiac Doppler, Color Doppler and  Intracardiac Opacification Agent  Indications:     Z95.2 S/p TAVR  History:         Patient has prior history of Echocardiogram examinations, most recent 01/27/2023. CAD, S/p TAVR (29mm Medtronic Evolut Pro Plus), Arrythmias:Atrial Fibrillation; Risk Factors:Hypertension, Diabetes, Dyslipidemia and Obesity. Aortic Valve: 29 mm Medtronic CoreValve-Evolut Pro prosthetic, stented (TAVR) valve is present in the aortic position. Procedure Date: 01/26/23.  Sonographer:     Samule Ohm RDCS Referring Phys:  [id number] Wille Celeste THOMPSON Diagnosing Phys: Weston Brass MD   Sonographer Comments: Technically difficult study due to poor echo windows and patient is obese. Image acquisition challenging due to patient body habitus. IMPRESSIONS   1. Left ventricular ejection fraction, by estimation, is 55 to 60%. The left ventricle has normal function. The left ventricle  has no regional wall motion abnormalities. Left ventricular diastolic parameters are consistent with Grade II diastolic dysfunction (pseudonormalization). 2. Right ventricular systolic function is normal. The right ventricular size is normal. There is normal pulmonary artery systolic pressure. The estimated right ventricular systolic pressure is 31.5 mmHg. 3. Left atrial size was moderately dilated. 4. Right atrial size was mildly dilated. 5. The mitral valve is degenerative. Mild mitral valve regurgitation. No evidence of mitral stenosis. 6. The aortic valve has been repaired/replaced. Aortic valve regurgitation is trivial posterior paravalvular leak. There is a 29 mm Medtronic CoreValve-Evolut Pro prosthetic (TAVR) valve present in the aortic position. Procedure Date: 01/26/23. Echo findings are consistent with normal structure and function of the aortic valve prosthesis. Aortic valve area, by VTI measures 1.43 cm. Aortic valve mean gradient measures 11.3 mmHg. Aortic valve Vmax measures 2.28 m/s. Aortic valve acceleration  time measures 90 msec. 7. The inferior vena cava is normal in size with greater than 50% respiratory variability, suggesting right atrial pressure of 3 mmHg.  FINDINGS Left Ventricle: Left ventricular ejection fraction, by estimation, is 55 to 60%. The left ventricle has normal function. The left ventricle has no regional wall motion abnormalities. Definity contrast agent was given IV to delineate the left ventricular endocardial borders. The left ventricular internal cavity size was normal in size. There is no left ventricular hypertrophy. Left ventricular diastolic parameters are consistent with Grade II diastolic dysfunction (pseudonormalization).  Right Ventricle: The right ventricular size is normal. No increase in right ventricular wall thickness. Right ventricular systolic function is normal. There is normal pulmonary artery systolic pressure. The tricuspid regurgitant velocity is 2.67 m/s, and with an assumed right atrial pressure of 3 mmHg, the estimated right ventricular systolic pressure is 31.5 mmHg.  Left Atrium: Left atrial size was moderately dilated.  Right Atrium: Right atrial size was mildly dilated.  Pericardium: There is no evidence of pericardial effusion.  Mitral Valve: The mitral valve is degenerative in appearance. Mild mitral annular calcification. Mild mitral valve regurgitation. No evidence of mitral valve stenosis. The mean mitral valve gradient is 3.5 mmHg.  Tricuspid Valve: The tricuspid valve is normal in structure. Tricuspid valve regurgitation is mild . No evidence of tricuspid stenosis.  Aortic Valve: The aortic valve has been repaired/replaced. Aortic valve regurgitation is trivial. Aortic valve mean gradient measures 11.3 mmHg. Aortic valve peak gradient measures 20.9 mmHg. Aortic valve area, by VTI measures 1.43 cm. There is a 29 mm Medtronic CoreValve-Evolut Pro prosthetic, stented (TAVR) valve present in the aortic position. Procedure Date: 01/26/23. Echo  findings are consistent with normal structure and function of the aortic valve prosthesis.  Pulmonic Valve: The pulmonic valve was normal in structure. Pulmonic valve regurgitation is mild. No evidence of pulmonic stenosis.  Aorta: The aortic root is normal in size and structure. Ascending aorta measurements are within normal limits for age when indexed to body surface area.  Venous: The inferior vena cava is normal in size with greater than 50% respiratory variability, suggesting right atrial pressure of 3 mmHg.  IAS/Shunts: No atrial level shunt detected by color flow Doppler.   LEFT VENTRICLE PLAX 2D LVIDd:         5.40 cm   Diastology LVIDs:         3.80 cm   LV e' medial:    6.74 cm/s LV PW:         0.80 cm   LV E/e' medial:  25.2 LV IVS:  0.90 cm   LV e' lateral:   8.05 cm/s LVOT diam:     2.10 cm   LV E/e' lateral: 21.1 LV SV:         83 LV SV Index:   44 LVOT Area:     3.46 cm   RIGHT VENTRICLE             IVC RV S prime:     12.00 cm/s  IVC diam: 1.60 cm TAPSE (M-mode): 2.3 cm RVSP:           31.5 mmHg  LEFT ATRIUM             Index        RIGHT ATRIUM           Index LA diam:        4.80 cm 2.52 cm/m   RA Pressure: 3.00 mmHg LA Vol (A2C):   87.3 ml 45.80 ml/m  RA Area:     19.90 cm LA Vol (A4C):   72.0 ml 37.77 ml/m  RA Volume:   62.60 ml  32.84 ml/m LA Biplane Vol: 79.0 ml 41.44 ml/m AORTIC VALVE AV Area (Vmax):    1.43 cm AV Area (Vmean):   1.55 cm AV Area (VTI):     1.43 cm AV Vmax:           228.39 cm/s AV Vmean:          156.915 cm/s AV VTI:            0.583 m AV Peak Grad:      20.9 mmHg AV Mean Grad:      11.3 mmHg LVOT Vmax:         94.20 cm/s LVOT Vmean:        70.400 cm/s LVOT VTI:          0.240 m LVOT/AV VTI ratio: 0.41  AORTA Ao Root diam: 3.80 cm Ao Asc diam:  3.50 cm  MITRAL VALVE                TRICUSPID VALVE MV Area (PHT): 3.26 cm     TR Peak grad:   28.5 mmHg MV Mean grad:  3.5 mmHg     TR Vmax:        267.00 cm/s MV  Decel Time: 233 msec     Estimated RAP:  3.00 mmHg MV E velocity: 170.00 cm/s  RVSP:           31.5 mmHg MV A velocity: 70.00 cm/s MV E/A ratio:  2.43         SHUNTS Systemic VTI:  0.24 m Systemic Diam: 2.10 cm  Weston Brass MD Electronically signed by Weston Brass MD Signature Date/Time: 02/25/2023/8:55:03 PM    Final (Updated)    MONITORS  LONG TERM MONITOR-LIVE TELEMETRY (3-14 DAYS) 02/12/2023  Narrative Patch Wear Time:  2 days and 22 hours  Patient had a min HR of 24 bpm, max HR of 164 bpm, and avg HR of 80 bpm. Predominant underlying rhythm was Sinus Rhythm. 1 run of Supraventricular Tachycardia occurred lasting 20 hours 41 mins with a max rate of 164 bpm (avg 111 bpm). 5 Pauses occurred, the longest lasting 10.6 secs (6 bpm). 9 episodes of second-degree AV block, longest 26 seconds Less than 1% ventricular and supraventricular ectopy  Will Camnitz, MD   CT SCANS  CT CORONARY MORPH W/CTA COR W/SCORE 12/31/2022  Addendum 12/31/2022 11:30 AM ADDENDUM REPORT: 12/31/2022 11:27  CLINICAL DATA:  Aortic stenosis  EXAM: Cardiac TAVR CT  TECHNIQUE: The patient was scanned on a Siemens Force 192 slice scanner. A 120 kV retrospective scan was triggered in the descending thoracic aorta at 111 HU's. Gantry rotation speed was 270 msecs and collimation was .9 mm. No beta blockade or nitro were given. The 3D data set was reconstructed in 5% intervals of the R-R cycle. Systolic and diastolic phases were analyzed on a dedicated work station using MPR, MIP and VRT modes. The patient received 80 cc of contrast.  FINDINGS: Aortic Valve: Tri leaflet calcified with restricted leaflet motion Calcium score 2150  Aorta: No aneurysm mild calcific atherosclerosis  Sinotubular Junction: 29 mm  Ascending Thoracic Aorta: 35 mm  Aortic Arch: Bovine arch 24 mm  Descending Thoracic Aorta: 23 mm  Sinus of Valsalva Measurements:  Non-coronary: 31.2 mm   Height 21.8  mm  Right - coronary: 30.5   mm  height 24.4 mm  Left - coronary: 30.9 mm   Height 23.5 mm  Coronary Artery Height above Annulus:  Left Main: 12.7 mm above annulus  Right Coronary: 18.2 mm above annulus  Virtual Basal Annulus Measurements:  Maximum/Minimum Diameter: 25.2 mm x 21.4 mm Average diameter 23.8 mm  Perimeter: 76.1 mm  Area: 444 mm2  Coronary Arteries: Sufficient height above annulus for deployment  Optimum Fluoroscopic Angle for Delivery: LAO 23 Caudal 10 degrees  Membranous septal length 7.2 mm  IMPRESSION: 1. Calcified tri leaflet AV with calcium score 2150  2. Annular area of 444 mm2 suitable for a 26 mm Sapien 3 valve Alternatively a 29 mm Medtronic Evolut can be considered  3.  Coronary arteries sufficient height above annulus for deployment  4. Optimum angiographic angle for deployment LAO 23 Caudal 10 degrees  5.  Membranous septal length 7.2 mm  Charlton Haws   Electronically Signed By: Charlton Haws M.D. On: 12/31/2022 11:27  Narrative EXAM: OVER-READ INTERPRETATION  CT CHEST  The following report is a limited chest CT over-read performed by radiologist Dr. Allegra Lai of [facility] on 12/31/2022. This over-read does not include interpretation of cardiac or coronary anatomy or pathology. The cardiac TAVR interpretation by the cardiologist is attached.  COMPARISON:  None Available.  FINDINGS: Extracardiac findings will be described separately under dictation for contemporaneously obtained CTA chest, abdomen and pelvis.  IMPRESSION: Please see separate dictation for contemporaneously obtained CTA chest, abdomen and pelvis dated 12/31/2022 for full description of relevant extracardiac findings.  Electronically Signed: By: Allegra Lai M.D. On: 12/31/2022 10:02          Risk Assessment/Calculations:    CHA2DS2-VASc Score = 6   This indicates a 9.7% annual risk of stroke. The patient's score is based  upon: CHF History: 1 HTN History: 1 Diabetes History: 1 Stroke History: 0 Vascular Disease History: 1 Age Score: 1 Gender Score: 1            Physical Exam:   VS:  BP 119/81 (BP Location: Left Arm, Patient Position: Sitting, Cuff Size: Normal)   Pulse 88   Ht 5\' 1"  (1.549 m)   Wt 209 lb (94.8 kg)   SpO2 96%   BMI 39.49 kg/m    Wt Readings from Last 3 Encounters:  03/22/23 209 lb (94.8 kg)  02/24/23 209 lb 6.4 oz (95 kg)  02/03/23 204 lb 6.4 oz (92.7 kg)    GEN: Well nourished, well developed in no acute distress NECK: No JVD; No carotid bruits CARDIAC: RRR, soft murmur,  rubs, gallops RESPIRATORY:  Clear to auscultation without rales, wheezing or rhonchi  ABDOMEN: Soft, non-tender, non-distended EXTREMITIES:  No edema; No deformity   ASSESSMENT AND PLAN: .   Nonobstructive CAD-mild to moderate nonobstructive to mid LAD per right left heart cath in preparation for TAVR.  Repeating LFTs and FLP today to see what adjustments need to be made to her lipid therapy. Stable with no anginal symptoms. No indication for ischemic evaluation.   S/p TAVR-recently evaluated by structural heart team, she has had a repeat echo which revealed her AVR was functioning appropriately. S/p PPM following CHB-device was interrogated in clinic on 02/19/2023, functioning appropriately.  She will follow-up with Dr. Elberta Fortis after the first of the year. PAF/hypercoagulable state/on amiodarone therapy-CHA2DS2-VASc score of 6, she is maintaining sinus rhythm, continue Xarelto 20 mg daily--creatinine clearance 97 send medication for dose reduction, continue amiodarone 100 mg twice daily--will repeat LFTs, TSH, not complaining of any shortness of breath etc., will check CBC today. Dyslipidemia -most recent LDL is elevated 95 on 01/14/2022, would prefer this to be less than 70.  Will recheck FLP and LFTs.       Dispo: Labs per above, follow-up with general cardiology in 5 months.  Signed, Flossie Dibble, NP

## 2023-03-22 ENCOUNTER — Encounter: Payer: Self-pay | Admitting: Cardiology

## 2023-03-22 ENCOUNTER — Telehealth: Payer: Self-pay

## 2023-03-22 ENCOUNTER — Ambulatory Visit: Payer: PPO | Attending: Cardiology | Admitting: Cardiology

## 2023-03-22 VITALS — BP 119/81 | HR 88 | Ht 61.0 in | Wt 209.0 lb

## 2023-03-22 DIAGNOSIS — Z79899 Other long term (current) drug therapy: Secondary | ICD-10-CM

## 2023-03-22 DIAGNOSIS — I442 Atrioventricular block, complete: Secondary | ICD-10-CM | POA: Diagnosis not present

## 2023-03-22 DIAGNOSIS — Z950 Presence of cardiac pacemaker: Secondary | ICD-10-CM | POA: Diagnosis not present

## 2023-03-22 DIAGNOSIS — Z952 Presence of prosthetic heart valve: Secondary | ICD-10-CM | POA: Diagnosis not present

## 2023-03-22 DIAGNOSIS — I5030 Unspecified diastolic (congestive) heart failure: Secondary | ICD-10-CM | POA: Diagnosis not present

## 2023-03-22 DIAGNOSIS — I10 Essential (primary) hypertension: Secondary | ICD-10-CM

## 2023-03-22 DIAGNOSIS — I480 Paroxysmal atrial fibrillation: Secondary | ICD-10-CM

## 2023-03-22 NOTE — Telephone Encounter (Signed)
Alert received from [employer] for Device alert for exceed AT/AF, AFL in progress, overall controlled rates Burden 24%, Xarelto per EPIC Near persistence per trends - route to triage   Called patient unable to leave voicemail.

## 2023-03-22 NOTE — Patient Instructions (Signed)
Medication Instructions:  Your physician recommends that you continue on your current medications as directed. Please refer to the Current Medication list given to you today.  *If you need a refill on your cardiac medications before your next appointment, please call your pharmacy*   Lab Work: Your physician recommends that you return for lab work in:   Labs today: CMP, CBC, TSH, Direct LDL  If you have labs (blood work) drawn today and your tests are completely normal, you will receive your results only by: MyChart Message (if you have MyChart) OR A paper copy in the mail If you have any lab test that is abnormal or we need to change your treatment, we will call you to review the results.   Testing/Procedures: None   Follow-Up: At [facility], you and your health needs are our priority.  As part of our continuing mission to provide you with exceptional heart care, we have created designated Provider Care Teams.  These Care Teams include your primary Cardiologist (physician) and Advanced Practice Providers (APPs -  Physician Assistants and Nurse Practitioners) who all work together to provide you with the care you need, when you need it.  We recommend signing up for the patient portal called "MyChart".  Sign up information is provided on this After Visit Summary.  MyChart is used to connect with patients for Virtual Visits (Telemedicine).  Patients are able to view lab/test results, encounter notes, upcoming appointments, etc.  Non-urgent messages can be sent to your provider as well.   To learn more about what you can do with MyChart, go to [email or web address].    Your next appointment:   5 month(s)  Provider:   Belva Crome, MD   Other Instructions None

## 2023-03-23 ENCOUNTER — Telehealth: Payer: Self-pay

## 2023-03-23 LAB — COMPREHENSIVE METABOLIC PANEL WITH GFR
ALT: 12 IU/L (ref 0–32)
AST: 22 IU/L (ref 0–40)
Albumin: 3.5 g/dL — ABNORMAL LOW (ref 3.9–4.9)
Alkaline Phosphatase: 117 IU/L (ref 44–121)
BUN/Creatinine Ratio: 12 (ref 12–28)
BUN: 10 mg/dL (ref 8–27)
Bilirubin Total: 0.4 mg/dL (ref 0.0–1.2)
CO2: 27 mmol/L (ref 20–29)
Calcium: 8.5 mg/dL — ABNORMAL LOW (ref 8.7–10.3)
Chloride: 103 mmol/L (ref 96–106)
Creatinine, Ser: 0.86 mg/dL (ref 0.57–1.00)
Globulin, Total: 2.9 g/dL (ref 1.5–4.5)
Glucose: 126 mg/dL — ABNORMAL HIGH (ref 70–99)
Potassium: 4.2 mmol/L (ref 3.5–5.2)
Sodium: 140 mmol/L (ref 134–144)
Total Protein: 6.4 g/dL (ref 6.0–8.5)
eGFR: 73 mL/min/1.73

## 2023-03-23 LAB — CBC
Hematocrit: 37.9 % (ref 34.0–46.6)
Hemoglobin: 10.9 g/dL — ABNORMAL LOW (ref 11.1–15.9)
MCH: 22.9 pg — ABNORMAL LOW (ref 26.6–33.0)
MCHC: 28.8 g/dL — ABNORMAL LOW (ref 31.5–35.7)
MCV: 80 fL (ref 79–97)
Platelets: 129 x10E3/uL — ABNORMAL LOW (ref 150–450)
RBC: 4.75 x10E6/uL (ref 3.77–5.28)
RDW: 15.7 % — ABNORMAL HIGH (ref 11.7–15.4)
WBC: 5 x10E3/uL (ref 3.4–10.8)

## 2023-03-23 LAB — LDL CHOLESTEROL, DIRECT: LDL Direct: 107 mg/dL — ABNORMAL HIGH (ref 0–99)

## 2023-03-23 LAB — TSH: TSH: 3.44 u[IU]/mL (ref 0.450–4.500)

## 2023-03-23 NOTE — Telephone Encounter (Signed)
-----   Message from Flossie Dibble sent at 03/23/2023  7:46 AM EST ----- Improving anemia.  Other labs are normal. Cholesterol is elevated--I do not see the Lipitor is on her medication list anymore only Zetia.  Can we clarify if she still has Lipitor?  If not, we need to restart her on Lipitor 40 mg daily.  Repeat FLP and LFTs in 8 weeks.

## 2023-03-23 NOTE — Telephone Encounter (Signed)
Unable to reach or LM ?

## 2023-03-23 NOTE — Telephone Encounter (Signed)
Patient called.  Unable to reach patient. No vm or answer.

## 2023-03-23 NOTE — Telephone Encounter (Signed)
Patient c/o dizziness and lightheadedness. Advised Afib clinic due to symptoms. Pt is agreeable. Advised patient Afib clinic would contact for appointment.

## 2023-03-24 ENCOUNTER — Telehealth: Payer: Self-pay

## 2023-03-24 DIAGNOSIS — E1169 Type 2 diabetes mellitus with other specified complication: Secondary | ICD-10-CM

## 2023-03-24 MED ORDER — ATORVASTATIN CALCIUM 40 MG PO TABS: 40.0000 mg | ORAL_TABLET | Freq: Every day | ORAL | 3 refills | Status: DC

## 2023-03-24 NOTE — Telephone Encounter (Signed)
Results reviewed with pt as per Wallis Bamberg PA's note.  Pt verbalized understanding and had no additional questions. Routed to PCP.

## 2023-03-24 NOTE — Telephone Encounter (Signed)
-----   Message from Flossie Dibble sent at 03/23/2023  7:46 AM EST ----- Improving anemia.  Other labs are normal. Cholesterol is elevated--I do not see the Lipitor is on her medication list anymore only Zetia.  Can we clarify if she still has Lipitor?  If not, we need to restart her on Lipitor 40 mg daily.  Repeat FLP and LFTs in 8 weeks.

## 2023-03-29 DIAGNOSIS — I447 Left bundle-branch block, unspecified: Secondary | ICD-10-CM | POA: Diagnosis not present

## 2023-03-29 DIAGNOSIS — Z6839 Body mass index (BMI) 39.0-39.9, adult: Secondary | ICD-10-CM | POA: Diagnosis not present

## 2023-03-29 DIAGNOSIS — I509 Heart failure, unspecified: Secondary | ICD-10-CM | POA: Diagnosis not present

## 2023-03-29 DIAGNOSIS — I2510 Atherosclerotic heart disease of native coronary artery without angina pectoris: Secondary | ICD-10-CM | POA: Diagnosis not present

## 2023-03-29 DIAGNOSIS — I4891 Unspecified atrial fibrillation: Secondary | ICD-10-CM | POA: Diagnosis not present

## 2023-03-29 DIAGNOSIS — Z923 Personal history of irradiation: Secondary | ICD-10-CM | POA: Diagnosis not present

## 2023-03-29 DIAGNOSIS — M19042 Primary osteoarthritis, left hand: Secondary | ICD-10-CM | POA: Diagnosis not present

## 2023-03-29 DIAGNOSIS — I361 Nonrheumatic tricuspid (valve) insufficiency: Secondary | ICD-10-CM | POA: Diagnosis not present

## 2023-03-29 DIAGNOSIS — Z950 Presence of cardiac pacemaker: Secondary | ICD-10-CM | POA: Diagnosis not present

## 2023-03-29 DIAGNOSIS — E039 Hypothyroidism, unspecified: Secondary | ICD-10-CM | POA: Diagnosis not present

## 2023-03-29 DIAGNOSIS — E876 Hypokalemia: Secondary | ICD-10-CM | POA: Diagnosis not present

## 2023-03-29 DIAGNOSIS — I454 Nonspecific intraventricular block: Secondary | ICD-10-CM | POA: Diagnosis not present

## 2023-03-29 DIAGNOSIS — Z952 Presence of prosthetic heart valve: Secondary | ICD-10-CM | POA: Diagnosis not present

## 2023-03-29 DIAGNOSIS — I5033 Acute on chronic diastolic (congestive) heart failure: Secondary | ICD-10-CM | POA: Diagnosis not present

## 2023-03-29 DIAGNOSIS — Z853 Personal history of malignant neoplasm of breast: Secondary | ICD-10-CM | POA: Diagnosis not present

## 2023-03-29 DIAGNOSIS — E7800 Pure hypercholesterolemia, unspecified: Secondary | ICD-10-CM | POA: Diagnosis not present

## 2023-03-29 DIAGNOSIS — I252 Old myocardial infarction: Secondary | ICD-10-CM | POA: Diagnosis not present

## 2023-03-29 DIAGNOSIS — E1169 Type 2 diabetes mellitus with other specified complication: Secondary | ICD-10-CM | POA: Diagnosis not present

## 2023-03-29 DIAGNOSIS — I340 Nonrheumatic mitral (valve) insufficiency: Secondary | ICD-10-CM | POA: Diagnosis not present

## 2023-03-29 DIAGNOSIS — R0602 Shortness of breath: Secondary | ICD-10-CM | POA: Diagnosis not present

## 2023-03-29 DIAGNOSIS — R9431 Abnormal electrocardiogram [ECG] [EKG]: Secondary | ICD-10-CM | POA: Diagnosis not present

## 2023-03-29 DIAGNOSIS — I110 Hypertensive heart disease with heart failure: Secondary | ICD-10-CM | POA: Diagnosis not present

## 2023-03-29 DIAGNOSIS — J849 Interstitial pulmonary disease, unspecified: Secondary | ICD-10-CM | POA: Diagnosis not present

## 2023-03-29 DIAGNOSIS — E119 Type 2 diabetes mellitus without complications: Secondary | ICD-10-CM | POA: Diagnosis not present

## 2023-03-29 DIAGNOSIS — I480 Paroxysmal atrial fibrillation: Secondary | ICD-10-CM | POA: Diagnosis not present

## 2023-03-29 DIAGNOSIS — Z79899 Other long term (current) drug therapy: Secondary | ICD-10-CM | POA: Diagnosis not present

## 2023-03-29 DIAGNOSIS — Z7901 Long term (current) use of anticoagulants: Secondary | ICD-10-CM | POA: Diagnosis not present

## 2023-03-29 DIAGNOSIS — I10 Essential (primary) hypertension: Secondary | ICD-10-CM | POA: Diagnosis not present

## 2023-03-29 DIAGNOSIS — M109 Gout, unspecified: Secondary | ICD-10-CM | POA: Diagnosis not present

## 2023-03-29 DIAGNOSIS — Z7984 Long term (current) use of oral hypoglycemic drugs: Secondary | ICD-10-CM | POA: Diagnosis not present

## 2023-03-30 ENCOUNTER — Ambulatory Visit (HOSPITAL_COMMUNITY): Payer: PPO | Admitting: Internal Medicine

## 2023-03-30 DIAGNOSIS — I2510 Atherosclerotic heart disease of native coronary artery without angina pectoris: Secondary | ICD-10-CM | POA: Diagnosis not present

## 2023-03-30 DIAGNOSIS — E1169 Type 2 diabetes mellitus with other specified complication: Secondary | ICD-10-CM | POA: Diagnosis not present

## 2023-03-30 DIAGNOSIS — I509 Heart failure, unspecified: Secondary | ICD-10-CM | POA: Diagnosis not present

## 2023-03-30 DIAGNOSIS — I10 Essential (primary) hypertension: Secondary | ICD-10-CM

## 2023-03-30 DIAGNOSIS — I4891 Unspecified atrial fibrillation: Secondary | ICD-10-CM | POA: Diagnosis not present

## 2023-04-01 DIAGNOSIS — Z9181 History of falling: Secondary | ICD-10-CM | POA: Diagnosis not present

## 2023-04-01 DIAGNOSIS — Z0000 Encounter for general adult medical examination without abnormal findings: Secondary | ICD-10-CM | POA: Diagnosis not present

## 2023-04-06 NOTE — Progress Notes (Unsigned)
Cardiology Office Note:  .   Date:  04/08/2023  ID:  Crystal Ramos, DOB 1956/04/14, MRN [id number] PCP: Paulina Fusi, MD  [facility] Providers Cardiologist:  Garwin Brothers, MD    History of Present Illness: Crystal Ramos Kitchen   Crystal Ramos is a 67 y.o. female with a past medical history of hypertension, PAF on Xarelto, chronic diastolic heart failure, severe aortic stenosis s/p TAVR on 01/26/2023, complete heart block s/p PPM 01/31/2023, unobstructive CAD per coronary CTA in 2024, DM 2, dyslipidemia, obesity, ILD.   Echo on 03/29/2023 revealed an EF of 55 to 60%, LA severely dilated, mild MR, mild concentric LVH, PASP severely elevated at 70 mmHg 02/17/2023 in-clinic device check revealed stable lead parameters and battery 01/31/2023 s/p PPM Saint Jude dual-chamber pacemaker 01/26/2023 TAVR with a 29 29 Evolut FX THV via the TF approach  12/07/2022 left right heart cath mild nonobstructive CAD 11/26/2022 echo EF 60%, severe AAS with a mean gradient 41 mmHg  She underwent TAVR on 01/26/2023 as she had progressive DOE and exertional fatigue.  She experienced CHB after TAVR however had no recurrent heart block for 48 hours and was sent home with a live monitor.  She presented back to the hospital 01/30/2023 with syncope as had with a 12-second pause, ultimately underwent PPM implantation on 01/31/2023.  Evaluated by the structural heart team on 02/24/2023 for 1 month postop TAVR, she reported that she was feeling well, felt like she was back to her baseline.  Evaluated on 03/22/2023 s/p TAVR, she was improving, some ongoing dizziness.   She was admitted to [facility] on 03/29/2023 for progressive shortness of breath with pedal edema.  proBNP was mildly elevated at 846, troponins were negative, TSH was elevated at 8.09, chest x-ray was suggestive of pulmonary edema.  She was diuresed with IV Lasix, her home diuretics were changed and she was ultimately discharged home.  She presents  today for follow-up after recent hospitalization as outlined above, she is feeling much better from a cardiac perspective.  She is weighing herself daily, adhering to fluid restrictions.  Was evaluated in our atrial fibrillation clinic yesterday by [place], [place], discussions were had surrounding a new PPM-as she will meet with Dr. Elberta Fortis in January to discuss this.  There was an alert on her device for atrial flutter with plans to proceed with conservative observation and to continue amiodarone at 100 mg twice daily. She denies chest pain, palpitations, dyspnea, pnd, orthopnea, n, v, dizziness, syncope, edema, weight gain, or early satiety.   ROS: Review of Systems  Constitutional: Negative.   All other systems reviewed and are negative.    Studies Reviewed: .        Cardiac Studies & Procedures   CARDIAC CATHETERIZATION  CARDIAC CATHETERIZATION 12/07/2022  Narrative Conclusions: Mild to moderate, non-obstructive coronary artery disease with up to 50% stenosis of the mid LAD. Mildly to moderately elevated left heart filling pressures (LVEDP 20 mmHg, PCWP 32 mm).  Prominent v-waves on PCWP tracing and discrepancy in pressures between PCWP and LVEDP suggest underling mitral valve disease. Severely elevated right heart and pulmonary artery pressures (RA 17 mmHg, mean PA 48 mmHg). Normal Fick cardiac output/index. Moderate to severe aortic valve stenosis (mean gradient 36 mmHg, valve area 0.8 cm^2).  Recommendations: Escalate diuresis. Restart rivaroxaban tomorrow if no evidence of bleeding/vascular injury at catheterization sites. Consultation with structural heart and advanced heart failure teams should be considered.  Yvonne Kendall, MD [facility]  Findings Coronary Findings Diagnostic  Dominance: Right  Left Main Vessel is large. Vessel is angiographically normal.  Left Anterior Descending Vessel is large. Mid LAD lesion is 50% stenosed. The lesion is irregular. The  lesion is mildly calcified.  First Diagonal Branch Vessel is moderate in size.  Ramus Intermedius Vessel is moderate in size. Ramus lesion is 30% stenosed.  Left Circumflex Vessel is large. Ost Cx to Prox Cx lesion is 30% stenosed.  First Obtuse Marginal Branch Vessel is small in size.  Second Obtuse Marginal Branch Vessel is moderate in size.  Third Obtuse Marginal Branch Vessel is moderate in size.  Right Coronary Artery Vessel is large. Ost RCA lesion is 30% stenosed.  Right Ventricular Branch RV Branch lesion is 95% stenosed.  Intervention  No interventions have been documented.   CARDIAC CATHETERIZATION  CARDIAC CATHETERIZATION 04/03/2019  Narrative  RV Branch lesion is 95% stenosed.  Ost Cx to Prox Cx lesion is 30% stenosed.  Ramus lesion is 30% stenosed.  Mid LAD-1 lesion is 30% stenosed.  Mid LAD-2 lesion is 50% stenosed.  Mid LAD to Dist LAD lesion is 30% stenosed.  1. Mild to moderate non-obstructive disease in the mid LAD 2. Mild non-obstructive disease in the intermediate branch and the Circumflex 3. The RCA is a large dominant vessel with no obstructive in the major segment or distal branches. The small RV marginal branch has a 95% ostial stenosis (too small for PCI). 4. Elevated LVEDP  Recommendations: Medical management of CAD. I would increase her Lasix dosage to 40 mg po BID. She was given Lasix 40 mg IV x 1 in the cath lab.  Findings Coronary Findings Diagnostic  Dominance: Right  Left Anterior Descending Vessel is large. Mid LAD-1 lesion is 30% stenosed. Mid LAD-2 lesion is 50% stenosed. Mid LAD to Dist LAD lesion is 30% stenosed.  Ramus Intermedius Vessel is moderate in size. Ramus lesion is 30% stenosed.  Left Circumflex Vessel is large. Ost Cx to Prox Cx lesion is 30% stenosed.  Right Coronary Artery Vessel is large.  Right Ventricular Branch RV Branch lesion is 95% stenosed.  Intervention  No interventions  have been documented.     ECHOCARDIOGRAM  ECHOCARDIOGRAM COMPLETE 02/24/2023  Narrative ECHOCARDIOGRAM REPORT    Patient Name:   Crystal Ramos Date of Exam: 02/24/2023 Medical Rec #:  [id number]            Height:       61.0 in Accession #:    [id number]           Weight:       204.4 lb Date of Birth:  03-06-1956            BSA:          1.906 m Patient Age:    67 years             BP:           120/50 mmHg Patient Gender: F                    HR:           62 bpm. Exam Location:  [address]  Procedure: 2D Echo, Cardiac Doppler, Color Doppler and Intracardiac Opacification Agent  Indications:     Z95.2 S/p TAVR  History:         Patient has prior history of Echocardiogram examinations, most recent 01/27/2023. CAD, S/p TAVR (29mm Medtronic Evolut Pro Plus), Arrythmias:Atrial  Fibrillation; Risk Factors:Hypertension, Diabetes, Dyslipidemia and Obesity. Aortic Valve: 29 mm Medtronic CoreValve-Evolut Pro prosthetic, stented (TAVR) valve is present in the aortic position. Procedure Date: 01/26/23.  Sonographer:     Samule Ohm RDCS Referring Phys:  [id number] Wille Celeste THOMPSON Diagnosing Phys: Weston Brass MD   Sonographer Comments: Technically difficult study due to poor echo windows and patient is obese. Image acquisition challenging due to patient body habitus. IMPRESSIONS   1. Left ventricular ejection fraction, by estimation, is 55 to 60%. The left ventricle has normal function. The left ventricle has no regional wall motion abnormalities. Left ventricular diastolic parameters are consistent with Grade II diastolic dysfunction (pseudonormalization). 2. Right ventricular systolic function is normal. The right ventricular size is normal. There is normal pulmonary artery systolic pressure. The estimated right ventricular systolic pressure is 31.5 mmHg. 3. Left atrial size was moderately dilated. 4. Right atrial size was mildly dilated. 5. The mitral valve is  degenerative. Mild mitral valve regurgitation. No evidence of mitral stenosis. 6. The aortic valve has been repaired/replaced. Aortic valve regurgitation is trivial posterior paravalvular leak. There is a 29 mm Medtronic CoreValve-Evolut Pro prosthetic (TAVR) valve present in the aortic position. Procedure Date: 01/26/23. Echo findings are consistent with normal structure and function of the aortic valve prosthesis. Aortic valve area, by VTI measures 1.43 cm. Aortic valve mean gradient measures 11.3 mmHg. Aortic valve Vmax measures 2.28 m/s. Aortic valve acceleration time measures 90 msec. 7. The inferior vena cava is normal in size with greater than 50% respiratory variability, suggesting right atrial pressure of 3 mmHg.  FINDINGS Left Ventricle: Left ventricular ejection fraction, by estimation, is 55 to 60%. The left ventricle has normal function. The left ventricle has no regional wall motion abnormalities. Definity contrast agent was given IV to delineate the left ventricular endocardial borders. The left ventricular internal cavity size was normal in size. There is no left ventricular hypertrophy. Left ventricular diastolic parameters are consistent with Grade II diastolic dysfunction (pseudonormalization).  Right Ventricle: The right ventricular size is normal. No increase in right ventricular wall thickness. Right ventricular systolic function is normal. There is normal pulmonary artery systolic pressure. The tricuspid regurgitant velocity is 2.67 m/s, and with an assumed right atrial pressure of 3 mmHg, the estimated right ventricular systolic pressure is 31.5 mmHg.  Left Atrium: Left atrial size was moderately dilated.  Right Atrium: Right atrial size was mildly dilated.  Pericardium: There is no evidence of pericardial effusion.  Mitral Valve: The mitral valve is degenerative in appearance. Mild mitral annular calcification. Mild mitral valve regurgitation. No evidence of mitral valve  stenosis. The mean mitral valve gradient is 3.5 mmHg.  Tricuspid Valve: The tricuspid valve is normal in structure. Tricuspid valve regurgitation is mild . No evidence of tricuspid stenosis.  Aortic Valve: The aortic valve has been repaired/replaced. Aortic valve regurgitation is trivial. Aortic valve mean gradient measures 11.3 mmHg. Aortic valve peak gradient measures 20.9 mmHg. Aortic valve area, by VTI measures 1.43 cm. There is a 29 mm Medtronic CoreValve-Evolut Pro prosthetic, stented (TAVR) valve present in the aortic position. Procedure Date: 01/26/23. Echo findings are consistent with normal structure and function of the aortic valve prosthesis.  Pulmonic Valve: The pulmonic valve was normal in structure. Pulmonic valve regurgitation is mild. No evidence of pulmonic stenosis.  Aorta: The aortic root is normal in size and structure. Ascending aorta measurements are within normal limits for age when indexed to body surface area.  Venous: The inferior vena cava is normal  in size with greater than 50% respiratory variability, suggesting right atrial pressure of 3 mmHg.  IAS/Shunts: No atrial level shunt detected by color flow Doppler.   LEFT VENTRICLE PLAX 2D LVIDd:         5.40 cm   Diastology LVIDs:         3.80 cm   LV e' medial:    6.74 cm/s LV PW:         0.80 cm   LV E/e' medial:  25.2 LV IVS:        0.90 cm   LV e' lateral:   8.05 cm/s LVOT diam:     2.10 cm   LV E/e' lateral: 21.1 LV SV:         83 LV SV Index:   44 LVOT Area:     3.46 cm   RIGHT VENTRICLE             IVC RV S prime:     12.00 cm/s  IVC diam: 1.60 cm TAPSE (M-mode): 2.3 cm RVSP:           31.5 mmHg  LEFT ATRIUM             Index        RIGHT ATRIUM           Index LA diam:        4.80 cm 2.52 cm/m   RA Pressure: 3.00 mmHg LA Vol (A2C):   87.3 ml 45.80 ml/m  RA Area:     19.90 cm LA Vol (A4C):   72.0 ml 37.77 ml/m  RA Volume:   62.60 ml  32.84 ml/m LA Biplane Vol: 79.0 ml 41.44 ml/m AORTIC  VALVE AV Area (Vmax):    1.43 cm AV Area (Vmean):   1.55 cm AV Area (VTI):     1.43 cm AV Vmax:           228.39 cm/s AV Vmean:          156.915 cm/s AV VTI:            0.583 m AV Peak Grad:      20.9 mmHg AV Mean Grad:      11.3 mmHg LVOT Vmax:         94.20 cm/s LVOT Vmean:        70.400 cm/s LVOT VTI:          0.240 m LVOT/AV VTI ratio: 0.41  AORTA Ao Root diam: 3.80 cm Ao Asc diam:  3.50 cm  MITRAL VALVE                TRICUSPID VALVE MV Area (PHT): 3.26 cm     TR Peak grad:   28.5 mmHg MV Mean grad:  3.5 mmHg     TR Vmax:        267.00 cm/s MV Decel Time: 233 msec     Estimated RAP:  3.00 mmHg MV E velocity: 170.00 cm/s  RVSP:           31.5 mmHg MV A velocity: 70.00 cm/s MV E/A ratio:  2.43         SHUNTS Systemic VTI:  0.24 m Systemic Diam: 2.10 cm  Weston Brass MD Electronically signed by Weston Brass MD Signature Date/Time: 02/25/2023/8:55:03 PM    Final (Updated)    MONITORS  LONG TERM MONITOR-LIVE TELEMETRY (3-14 DAYS) 02/12/2023  Narrative Patch Wear Time:  2 days and 22 hours  Patient had a min HR  of 24 bpm, max HR of 164 bpm, and avg HR of 80 bpm. Predominant underlying rhythm was Sinus Rhythm. 1 run of Supraventricular Tachycardia occurred lasting 20 hours 41 mins with a max rate of 164 bpm (avg 111 bpm). 5 Pauses occurred, the longest lasting 10.6 secs (6 bpm). 9 episodes of second-degree AV block, longest 26 seconds Less than 1% ventricular and supraventricular ectopy  Will Camnitz, MD   CT SCANS  CT CORONARY MORPH W/CTA COR W/SCORE 12/31/2022  Addendum 12/31/2022 11:30 AM ADDENDUM REPORT: 12/31/2022 11:27  CLINICAL DATA:  Aortic stenosis  EXAM: Cardiac TAVR CT  TECHNIQUE: The patient was scanned on a Siemens Force 192 slice scanner. A 120 kV retrospective scan was triggered in the descending thoracic aorta at 111 HU's. Gantry rotation speed was 270 msecs and collimation was .9 mm. No beta blockade or nitro were given. The 3D  data set was reconstructed in 5% intervals of the R-R cycle. Systolic and diastolic phases were analyzed on a dedicated work station using MPR, MIP and VRT modes. The patient received 80 cc of contrast.  FINDINGS: Aortic Valve: Tri leaflet calcified with restricted leaflet motion Calcium score 2150  Aorta: No aneurysm mild calcific atherosclerosis  Sinotubular Junction: 29 mm  Ascending Thoracic Aorta: 35 mm  Aortic Arch: Bovine arch 24 mm  Descending Thoracic Aorta: 23 mm  Sinus of Valsalva Measurements:  Non-coronary: 31.2 mm   Height 21.8 mm  Right - coronary: 30.5   mm  height 24.4 mm  Left - coronary: 30.9 mm   Height 23.5 mm  Coronary Artery Height above Annulus:  Left Main: 12.7 mm above annulus  Right Coronary: 18.2 mm above annulus  Virtual Basal Annulus Measurements:  Maximum/Minimum Diameter: 25.2 mm x 21.4 mm Average diameter 23.8 mm  Perimeter: 76.1 mm  Area: 444 mm2  Coronary Arteries: Sufficient height above annulus for deployment  Optimum Fluoroscopic Angle for Delivery: LAO 23 Caudal 10 degrees  Membranous septal length 7.2 mm  IMPRESSION: 1. Calcified tri leaflet AV with calcium score 2150  2. Annular area of 444 mm2 suitable for a 26 mm Sapien 3 valve Alternatively a 29 mm Medtronic Evolut can be considered  3.  Coronary arteries sufficient height above annulus for deployment  4. Optimum angiographic angle for deployment LAO 23 Caudal 10 degrees  5.  Membranous septal length 7.2 mm  Charlton Haws   Electronically Signed By: Charlton Haws M.D. On: 12/31/2022 11:27  Narrative EXAM: OVER-READ INTERPRETATION  CT CHEST  The following report is a limited chest CT over-read performed by radiologist Dr. Allegra Lai of [facility] on 12/31/2022. This over-read does not include interpretation of cardiac or coronary anatomy or pathology. The cardiac TAVR interpretation by the cardiologist is attached.  COMPARISON:   None Available.  FINDINGS: Extracardiac findings will be described separately under dictation for contemporaneously obtained CTA chest, abdomen and pelvis.  IMPRESSION: Please see separate dictation for contemporaneously obtained CTA chest, abdomen and pelvis dated 12/31/2022 for full description of relevant extracardiac findings.  Electronically Signed: By: Allegra Lai M.D. On: 12/31/2022 10:02          Risk Assessment/Calculations:    CHA2DS2-VASc Score = 6   This indicates a 9.7% annual risk of stroke. The patient's score is based upon: CHF History: 1 HTN History: 1 Diabetes History: 1 Stroke History: 0 Vascular Disease History: 1 Age Score: 1 Gender Score: 1            Physical Exam:  VS:  BP (!) 122/54 (BP Location: Right Arm, Patient Position: Sitting, Cuff Size: Normal)   Pulse 60   Ht 5\' 1"  (1.549 m)   Wt 201 lb (91.2 kg)   SpO2 94%   BMI 37.98 kg/m    Wt Readings from Last 3 Encounters:  04/08/23 201 lb (91.2 kg)  04/07/23 201 lb 9.6 oz (91.4 kg)  03/22/23 209 lb (94.8 kg)    GEN: Well nourished, well developed in no acute distress NECK: No JVD; No carotid bruits CARDIAC: RRR, soft murmur, rubs, gallops RESPIRATORY:  Clear to auscultation without rales, wheezing or rhonchi  ABDOMEN: Soft, non-tender, non-distended EXTREMITIES:  No edema; No deformity   ASSESSMENT AND PLAN: .   Nonobstructive CAD-mild to moderate nonobstructive to mid LAD per right left heart cath in preparation for TAVR.  Continue Lipitor 40 mg daily, continue Zetia 10 mg daily, continue metoprolol 25 mg daily, continue nitroglycerin as needed.  Stable with no anginal symptoms. No indication for ischemic evaluation.  Heart healthy diet and regular cardiovascular exercise encouraged.   HFpEF-NYHA class I, euvolemic, continue Jardiance, continue metoprolol, continue torsemide--currently taking 40 mg twice daily--will repeat BMET and proBNP today.  Discussed fluid restriction of 64  ounces per day, sodium restriction, continue daily weights and to notify our office for a weight gain of 3 pounds in 1 day. S/p TAVR-recently evaluated by structural heart team, she has had a repeat echo which revealed her AVR was functioning appropriately. S/p PPM following CHB-device was interrogated in clinic on 02/19/2023, functioning appropriately--however alerted on 11/4 for atrial flutter.  She will follow-up with Dr. Elberta Fortis after the first of the year to discuss PPM traded out. PAF/hypercoagulable state/on amiodarone therapy-CHA2DS2-VASc score of 6, she is maintaining sinus rhythm, continue Xarelto 20 mg daily--creatinine clearance 97 no indication for dose reduction, continue amiodarone 100 mg twice daily.  Continue to 25 mg daily. Dyslipidemia -most recent LDL is elevated 107 on 03/24/2023, would prefer this to be less than 70. We restarted her Lipitor 40 mg daily at last OV. Will repeat FLP and LFTs in 4 weeks.        Dispo: Move follow up appt with her primary cardiologist ~ 4-6 weeks. CBC, BMET, ProBNP today.   Signed, Flossie Dibble, NP

## 2023-04-07 ENCOUNTER — Ambulatory Visit (HOSPITAL_BASED_OUTPATIENT_CLINIC_OR_DEPARTMENT_OTHER)
Admission: RE | Admit: 2023-04-07 | Discharge: 2023-04-07 | Disposition: A | Discharge status: Home / Self Care | Payer: PPO | Source: Ambulatory Visit | Attending: Internal Medicine | Admitting: Internal Medicine

## 2023-04-07 ENCOUNTER — Ambulatory Visit (HOSPITAL_COMMUNITY)
Admission: RE | Admit: 2023-04-07 | Discharge: 2023-04-07 | Disposition: A | Discharge status: Home / Self Care | Payer: PPO | Source: Ambulatory Visit | Attending: Physician Assistant | Admitting: Physician Assistant

## 2023-04-07 VITALS — BP 138/62 | HR 60 | Ht 61.0 in | Wt 201.6 lb

## 2023-04-07 DIAGNOSIS — I480 Paroxysmal atrial fibrillation: Secondary | ICD-10-CM

## 2023-04-07 DIAGNOSIS — I4891 Unspecified atrial fibrillation: Secondary | ICD-10-CM | POA: Diagnosis not present

## 2023-04-07 DIAGNOSIS — R911 Solitary pulmonary nodule: Secondary | ICD-10-CM | POA: Diagnosis not present

## 2023-04-07 DIAGNOSIS — J984 Other disorders of lung: Secondary | ICD-10-CM | POA: Diagnosis not present

## 2023-04-07 NOTE — Progress Notes (Signed)
Referral faxed to [facility]

## 2023-04-07 NOTE — Patient Instructions (Signed)
Great to see you today!!!  Continue your current medications  You have been referred to [facility] for a sleep study, they will call you to schedule

## 2023-04-07 NOTE — Progress Notes (Signed)
Primary Care Physician: Paulina Fusi, MD Primary Cardiologist: Garwin Brothers, MD Electrophysiologist: None     Referring Physician: Device clinic     Crystal Ramos is a 67 y.o. female with a history of HTN, chronic diastolic heart failure, severe aortic stenosis s/p TAVR on 01/26/23, complete heart block s/p PPM 01/31/23, nonobstructive CAD per coronary CTA in 2024, T2DM, dyslipidemia, obesity, and persistent atrial fibrillation and atrial flutter who presents for consultation in the [facility]. Device alert on 03/22/23 for ongoing atrial flutter with 24% burden and controlled rates. She is already on amiodarone 100 mg BID. Patient is on Xarelto for a CHADS2VASC score of 6.  On evaluation today, she is currently in AV paced rhythm. Patient does not drink alcohol and does snore.   Today, she denies symptoms of palpitations, chest pain, shortness of breath, orthopnea, PND, lower extremity edema, dizziness, presyncope, syncope, snoring, daytime somnolence, bleeding, or neurologic sequela. The patient is tolerating medications without difficulties and is otherwise without complaint today.    she has a BMI of Body mass index is 38.09 kg/m.Marland Kitchen Filed Weights   04/07/23 0836  Weight: 91.4 kg    Current Outpatient Medications  Medication Sig Dispense Refill   ALPRAZolam (XANAX) 0.25 MG tablet Take 0.25 mg by mouth at bedtime.      amiodarone (PACERONE) 200 MG tablet Take 0.5 tablets (100 mg total) by mouth 2 (two) times daily. 90 tablet 2   atorvastatin (LIPITOR) 40 MG tablet Take 1 tablet (40 mg total) by mouth daily. 90 tablet 3   ezetimibe (ZETIA) 10 MG tablet Take 1 tablet by mouth once daily 30 tablet 3   JARDIANCE 25 MG TABS tablet Take 25 mg by mouth daily.     levothyroxine (SYNTHROID) 88 MCG tablet Take 88 mcg by mouth daily before breakfast.     metFORMIN (GLUCOPHAGE) 850 MG tablet Take 850 mg by mouth 3 (three) times daily.     metoprolol  succinate (TOPROL-XL) 25 MG 24 hr tablet Take 1 tablet (25 mg total) by mouth daily. 180 tablet 3   nitroGLYCERIN (NITROSTAT) 0.4 MG SL tablet Place 0.4 mg under the tongue every 5 (five) minutes as needed for chest pain.     potassium chloride SA (KLOR-CON M) 20 MEQ tablet Take 1 tablet (20 mEq total) by mouth daily. 30 tablet 11   raloxifene (EVISTA) 60 MG tablet Take 1 tablet by mouth once daily 30 tablet 5   rivaroxaban (XARELTO) 20 MG TABS tablet Take 1 tablet (20 mg total) by mouth daily with supper.     torsemide (DEMADEX) 20 MG tablet Take 20 mg by mouth 2 (two) times daily.     traMADol (ULTRAM) 50 MG tablet Take 50 mg by mouth 2 (two) times daily as needed for moderate pain.     No current facility-administered medications for this encounter.    Atrial Fibrillation Management history:  Previous antiarrhythmic drugs: amiodarone Previous cardioversions: none Previous ablations: none Anticoagulation history: Xarelto   ROS- All systems are reviewed and negative except as per the HPI above.  Physical Exam: BP 138/62   Pulse 60   Ht 5\' 1"  (1.549 m)   Wt 91.4 kg   BMI 38.09 kg/m   GEN: Well nourished, well developed in no acute distress NECK: No JVD; No carotid bruits CARDIAC: Regular rate and rhythm, no murmurs, rubs, gallops RESPIRATORY:  Clear to auscultation without rales, wheezing or rhonchi  ABDOMEN: Soft, non-tender,  non-distended EXTREMITIES:  No edema; No deformity   EKG today demonstrates  Vent. rate 60 BPM PR interval 230 ms QRS duration 114 ms QT/QTcB 518/518 ms P-R-T axes 44 109 29 AV dual-paced rhythm with prolonged AV conduction Abnormal ECG When compared with ECG of 31-Jan-2023 14:52, PREVIOUS ECG IS PRESENT  Echo 02/24/23 demonstrated   1. Left ventricular ejection fraction, by estimation, is 55 to 60%. The  left ventricle has normal function. The left ventricle has no regional  wall motion abnormalities. Left ventricular diastolic parameters are   consistent with Grade II diastolic  dysfunction (pseudonormalization).   2. Right ventricular systolic function is normal. The right ventricular  size is normal. There is normal pulmonary artery systolic pressure. The  estimated right ventricular systolic pressure is 31.5 mmHg.   3. Left atrial size was moderately dilated.   4. Right atrial size was mildly dilated.   5. The mitral valve is degenerative. Mild mitral valve regurgitation. No  evidence of mitral stenosis.   6. The aortic valve has been repaired/replaced. Aortic valve  regurgitation is trivial posterior paravalvular leak. There is a 29 mm  Medtronic CoreValve-Evolut Pro prosthetic (TAVR) valve present in the  aortic position. Procedure Date: 01/26/23. Echo  findings are consistent with normal structure and function of the aortic  valve prosthesis. Aortic valve area, by VTI measures 1.43 cm. Aortic  valve mean gradient measures 11.3 mmHg. Aortic valve Vmax measures 2.28  m/s. Aortic valve acceleration time  measures 90 msec.   7. The inferior vena cava is normal in size with greater than 50%  respiratory variability, suggesting right atrial pressure of 3 mmHg.   ASSESSMENT & PLAN CHA2DS2-VASc Score = 6  The patient's score is based upon: CHF History: 1 HTN History: 1 Diabetes History: 1 Stroke History: 0 Vascular Disease History: 1 Age Score: 1 Gender Score: 1       ASSESSMENT AND PLAN: Paroxysmal Atrial Fibrillation (ICD10:  I48.0) The patient's CHA2DS2-VASc score is 6, indicating a 9.7% annual risk of stroke.    She is currently in AV paced rhythm. Discussion regarding new PPM, device check on 02/17/23, and alert for atrial flutter on 11/4. After discussion, we will proceed with conservative observation and continue to follow device checks to obtain a more accurate burden over several months. Continue amiodarone 100 mg BID.   She may be a potential candidate for ablation considering age and current AAD.    Secondary Hypercoagulable State (ICD10:  D68.69) The patient is at significant risk for stroke/thromboembolism based upon her CHA2DS2-VASc Score of 6.  Continue Rivaroxaban (Xarelto).  Continue Xarelto.   Follow up as scheduled with Dr. Elberta Fortis.   [place] Bells, PA-C  [facility] [address] [place], [place] [address] [phone]

## 2023-04-07 NOTE — Addendum Note (Signed)
Encounter addended by: Noralee Space, RN on: 04/07/2023 4:45 PM  Actions taken: Clinical Note Signed

## 2023-04-08 ENCOUNTER — Encounter: Payer: Self-pay | Admitting: Cardiology

## 2023-04-08 ENCOUNTER — Ambulatory Visit: Payer: PPO | Attending: Cardiology | Admitting: Cardiology

## 2023-04-08 VITALS — BP 122/54 | HR 60 | Ht 61.0 in | Wt 201.0 lb

## 2023-04-08 DIAGNOSIS — K7469 Other cirrhosis of liver: Secondary | ICD-10-CM | POA: Diagnosis not present

## 2023-04-08 DIAGNOSIS — E1169 Type 2 diabetes mellitus with other specified complication: Secondary | ICD-10-CM | POA: Diagnosis not present

## 2023-04-08 DIAGNOSIS — R0609 Other forms of dyspnea: Secondary | ICD-10-CM

## 2023-04-08 DIAGNOSIS — N2889 Other specified disorders of kidney and ureter: Secondary | ICD-10-CM | POA: Diagnosis not present

## 2023-04-08 DIAGNOSIS — I480 Paroxysmal atrial fibrillation: Secondary | ICD-10-CM | POA: Diagnosis not present

## 2023-04-08 DIAGNOSIS — I5033 Acute on chronic diastolic (congestive) heart failure: Secondary | ICD-10-CM | POA: Diagnosis not present

## 2023-04-08 DIAGNOSIS — I5030 Unspecified diastolic (congestive) heart failure: Secondary | ICD-10-CM | POA: Diagnosis not present

## 2023-04-08 DIAGNOSIS — Z952 Presence of prosthetic heart valve: Secondary | ICD-10-CM

## 2023-04-08 DIAGNOSIS — Z950 Presence of cardiac pacemaker: Secondary | ICD-10-CM | POA: Diagnosis not present

## 2023-04-08 DIAGNOSIS — I2510 Atherosclerotic heart disease of native coronary artery without angina pectoris: Secondary | ICD-10-CM

## 2023-04-08 DIAGNOSIS — I350 Nonrheumatic aortic (valve) stenosis: Secondary | ICD-10-CM | POA: Diagnosis not present

## 2023-04-08 DIAGNOSIS — D6859 Other primary thrombophilia: Secondary | ICD-10-CM | POA: Diagnosis not present

## 2023-04-08 DIAGNOSIS — E785 Hyperlipidemia, unspecified: Secondary | ICD-10-CM | POA: Diagnosis not present

## 2023-04-08 DIAGNOSIS — I442 Atrioventricular block, complete: Secondary | ICD-10-CM

## 2023-04-08 DIAGNOSIS — J849 Interstitial pulmonary disease, unspecified: Secondary | ICD-10-CM | POA: Diagnosis not present

## 2023-04-08 DIAGNOSIS — I10 Essential (primary) hypertension: Secondary | ICD-10-CM

## 2023-04-08 DIAGNOSIS — D509 Iron deficiency anemia, unspecified: Secondary | ICD-10-CM | POA: Diagnosis not present

## 2023-04-08 NOTE — Patient Instructions (Addendum)
Medication Instructions:  Your physician recommends that you continue on your current medications as directed. Please refer to the Current Medication list given to you today.  *If you need a refill on your cardiac medications before your next appointment, please call your pharmacy*   Lab Work: CMP, CBC, ProBNP, - today If you have labs (blood work) drawn today and your tests are completely normal, you will receive your results only by: MyChart Message (if you have MyChart) OR A paper copy in the mail If you have any lab test that is abnormal or we need to change your treatment, we will call you to review the results.   Testing/Procedures: None Ordered   Follow-Up: At [facility], you and your health needs are our priority.  As part of our continuing mission to provide you with exceptional heart care, we have created designated Provider Care Teams.  These Care Teams include your primary Cardiologist (physician) and Advanced Practice Providers (APPs -  Physician Assistants and Nurse Practitioners) who all work together to provide you with the care you need, when you need it.  We recommend signing up for the patient portal called "MyChart".  Sign up information is provided on this After Visit Summary.  MyChart is used to connect with patients for Virtual Visits (Telemedicine).  Patients are able to view lab/test results, encounter notes, upcoming appointments, etc.  Non-urgent messages can be sent to your provider as well.   To learn more about what you can do with MyChart, go to [email or web address].    Your next appointment:   4 -6 week(s) with Dr. Tomie China  The format for your next appointment:   In Person  Provider:   Wallis Bamberg, NP   Other Instructions NA

## 2023-04-09 ENCOUNTER — Telehealth: Payer: Self-pay

## 2023-04-09 ENCOUNTER — Telehealth: Payer: Self-pay | Admitting: Cardiology

## 2023-04-09 DIAGNOSIS — E1169 Type 2 diabetes mellitus with other specified complication: Secondary | ICD-10-CM | POA: Diagnosis not present

## 2023-04-09 DIAGNOSIS — D509 Iron deficiency anemia, unspecified: Secondary | ICD-10-CM | POA: Diagnosis not present

## 2023-04-09 DIAGNOSIS — E785 Hyperlipidemia, unspecified: Secondary | ICD-10-CM | POA: Diagnosis not present

## 2023-04-09 DIAGNOSIS — E039 Hypothyroidism, unspecified: Secondary | ICD-10-CM | POA: Diagnosis not present

## 2023-04-09 LAB — COMPREHENSIVE METABOLIC PANEL WITH GFR
ALT: 12 IU/L (ref 0–32)
AST: 25 IU/L (ref 0–40)
Albumin: 3.9 g/dL (ref 3.9–4.9)
Alkaline Phosphatase: 114 IU/L (ref 44–121)
BUN/Creatinine Ratio: 15 (ref 12–28)
BUN: 14 mg/dL (ref 8–27)
Bilirubin Total: 0.3 mg/dL (ref 0.0–1.2)
CO2: 24 mmol/L (ref 20–29)
Calcium: 9 mg/dL (ref 8.7–10.3)
Chloride: 102 mmol/L (ref 96–106)
Creatinine, Ser: 0.96 mg/dL (ref 0.57–1.00)
Globulin, Total: 3.2 g/dL (ref 1.5–4.5)
Glucose: 70 mg/dL (ref 70–99)
Potassium: 4 mmol/L (ref 3.5–5.2)
Sodium: 146 mmol/L — ABNORMAL HIGH (ref 134–144)
Total Protein: 7.1 g/dL (ref 6.0–8.5)
eGFR: 64 mL/min/1.73

## 2023-04-09 LAB — CBC
Hematocrit: 38 % (ref 34.0–46.6)
Hemoglobin: 10.9 g/dL — ABNORMAL LOW (ref 11.1–15.9)
MCH: 22.5 pg — ABNORMAL LOW (ref 26.6–33.0)
MCHC: 28.7 g/dL — ABNORMAL LOW (ref 31.5–35.7)
MCV: 79 fL (ref 79–97)
Platelets: 167 x10E3/uL (ref 150–450)
RBC: 4.84 x10E6/uL (ref 3.77–5.28)
RDW: 15.8 % — ABNORMAL HIGH (ref 11.7–15.4)
WBC: 6 x10E3/uL (ref 3.4–10.8)

## 2023-04-09 LAB — PRO B NATRIURETIC PEPTIDE: NT-Pro BNP: 397 pg/mL — ABNORMAL HIGH (ref 0–301)

## 2023-04-09 NOTE — Telephone Encounter (Signed)
-----   Message from Flossie Dibble sent at 04/09/2023  9:44 AM EST ----- Stable electrolytes and kidney function. Stable anemia.   Good results!

## 2023-04-09 NOTE — Telephone Encounter (Signed)
Unable to reach or LM ?

## 2023-04-09 NOTE — Telephone Encounter (Signed)
Patient informed of results.  

## 2023-04-09 NOTE — Telephone Encounter (Signed)
Follow Up:     Patient is returning a call from today.

## 2023-04-29 DIAGNOSIS — E039 Hypothyroidism, unspecified: Secondary | ICD-10-CM | POA: Insufficient documentation

## 2023-05-03 ENCOUNTER — Ambulatory Visit (INDEPENDENT_AMBULATORY_CARE_PROVIDER_SITE_OTHER): Payer: PPO

## 2023-05-03 DIAGNOSIS — I480 Paroxysmal atrial fibrillation: Secondary | ICD-10-CM

## 2023-05-03 LAB — CUP PACEART REMOTE DEVICE CHECK
Battery Remaining Longevity: 62 mo
Battery Remaining Percentage: 95.5 %
Battery Voltage: 2.99 V
Brady Statistic AP VP Percent: 53 %
Brady Statistic AP VS Percent: 1 %
Brady Statistic AS VP Percent: 45 %
Brady Statistic AS VS Percent: 1 %
Brady Statistic RA Percent Paced: 46 %
Brady Statistic RV Percent Paced: 88 %
Date Time Interrogation Session: 20241216042611
Implantable Lead Connection Status: 753985
Implantable Lead Connection Status: 753985
Implantable Lead Implant Date: 20240915
Implantable Lead Implant Date: 20240915
Implantable Lead Location: 753859
Implantable Lead Location: 753860
Implantable Lead Model: 3830
Implantable Pulse Generator Implant Date: 20240915
Lead Channel Impedance Value: 410 Ohm
Lead Channel Impedance Value: 560 Ohm
Lead Channel Pacing Threshold Amplitude: 1 V
Lead Channel Pacing Threshold Amplitude: 1 V
Lead Channel Pacing Threshold Pulse Width: 0.5 ms
Lead Channel Pacing Threshold Pulse Width: 0.5 ms
Lead Channel Sensing Intrinsic Amplitude: 2.6 mV
Lead Channel Sensing Intrinsic Amplitude: 9.3 mV
Lead Channel Setting Pacing Amplitude: 3.5 V
Lead Channel Setting Pacing Amplitude: 3.5 V
Lead Channel Setting Pacing Pulse Width: 0.5 ms
Lead Channel Setting Sensing Sensitivity: 2 mV
Pulse Gen Model: 2272
Pulse Gen Serial Number: 8213048

## 2023-05-06 ENCOUNTER — Ambulatory Visit: Payer: PPO | Attending: Cardiology | Admitting: Cardiology

## 2023-05-06 ENCOUNTER — Encounter: Payer: Self-pay | Admitting: Cardiology

## 2023-05-06 VITALS — BP 113/68 | HR 62 | Ht 61.0 in | Wt 206.0 lb

## 2023-05-06 DIAGNOSIS — I10 Essential (primary) hypertension: Secondary | ICD-10-CM

## 2023-05-06 DIAGNOSIS — I480 Paroxysmal atrial fibrillation: Secondary | ICD-10-CM | POA: Diagnosis not present

## 2023-05-06 MED ORDER — METOPROLOL SUCCINATE ER 25 MG PO TB24: 12.5000 mg | ORAL_TABLET | Freq: Every day | ORAL | 3 refills | Status: DC

## 2023-05-06 NOTE — Progress Notes (Signed)
Cardiology Office Note:    Date:  05/06/2023   ID:  Crystal Ramos, DOB Jun 11, 1955, MRN [id number]  PCP:  Paulina Fusi, MD  Cardiologist:  Garwin Brothers, MD   Referring MD: Paulina Fusi, MD    ASSESSMENT:    1. Paroxysmal atrial fibrillation (HCC)   2. Essential hypertension    PLAN:    In order of problems listed above:  Coronary artery disease: Secondary prevention stressed with the patient.  Importance of compliance with diet medication stressed and she vocalized understanding.  She was advised to be active as her health permits Essential hypertension: Blood pressure is borderline.  She has had dizzy spells.  She has not fallen down.  I have told her to cut down beta-blocker and diuretic to half dose of what she is taking now.  She will have a Chem-7 today.  She will keep a track of her pulse blood pressures and get back to [place] in a week. Mixed dyslipidemia: On lipid-lowering medications followed by primary care. Diabetes mellitus and morbid obesity: Lifestyle modification urged he promises to do better. Post permanent pacemaker: Followed by primary care. Paroxysmal atrial fibrillation:I discussed with the patient atrial fibrillation, disease process. Management and therapy including rate and rhythm control, anticoagulation benefits and potential risks were discussed extensively with the patient. Patient had multiple questions which were answered to patient's satisfaction.  Amiodarone therapy benefits and potential risks explained and questions were answered to her satisfaction.  Will do LFTs today. Patient will be seen in follow-up appointment in 3 months or earlier if the patient has any concerns.    Medication Adjustments/Labs and Tests Ordered: Current medicines are reviewed at length with the patient today.  Concerns regarding medicines are outlined above.  Orders Placed This Encounter  Procedures   EKG 12-Lead   No orders of the defined types were  placed in this encounter.    Chief Complaint  Patient presents with   Follow-up     History of Present Illness:    Crystal Ramos is a 67 y.o. female.  Patient has past medical history of congestive heart failure with preserved ejection fraction, essential hypertension, mixed dyslipidemia and diabetes mellitus.  She denies any problems at this time except for feeling dizzy spells.  She appears to give me history suggestive of postural hypotension.  She has also gained some weight.  At the time of my evaluation, the patient is alert awake oriented and in no distress.  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 Thukkani and  Bartle   Severe aortic stenosis 12/02/2022   Sinus pause 01/30/2023   Type 2 diabetes mellitus without complication (HCC) 12/18/2015    Past Surgical History:  Procedure Laterality Date   CHOLECYSTECTOMY     INTRAOPERATIVE TRANSTHORACIC ECHOCARDIOGRAM  01/26/2023   Procedure:  INTRAOPERATIVE TRANSTHORACIC ECHOCARDIOGRAM;  Surgeon: Orbie Pyo, MD;  Location: [facility] INVASIVE CV LAB;  Service: Open Heart Surgery;;   LEFT HEART CATH AND CORONARY ANGIOGRAPHY N/A 04/03/2019   Procedure: LEFT HEART CATH AND CORONARY ANGIOGRAPHY;  Surgeon: Kathleene Hazel, MD;  Location: [facility] INVASIVE CV LAB;  Service: Cardiovascular;  Laterality: N/A;   MEDIAL PARTIAL KNEE REPLACEMENT     PACEMAKER IMPLANT N/A 01/31/2023   Procedure: PACEMAKER IMPLANT;  Surgeon: Duke Salvia, MD;  Location: [facility] INVASIVE CV LAB;  Service: Cardiovascular;  Laterality: N/A;   RIGHT/LEFT HEART CATH AND CORONARY ANGIOGRAPHY N/A 12/07/2022   Procedure: RIGHT/LEFT HEART CATH AND CORONARY ANGIOGRAPHY;  Surgeon: Yvonne Kendall, MD;  Location: [facility] INVASIVE CV LAB;  Service: Cardiovascular;  Laterality: N/A;   TRANSCATHETER AORTIC VALVE REPLACEMENT, TRANSFEMORAL Right 01/26/2023   Procedure: Transcatheter Aortic Valve Replacement, Transfemoral;  Surgeon: Orbie Pyo, MD;  Location: [facility] INVASIVE CV LAB;  Service: Open Heart Surgery;  Laterality: Right;    Current Medications: Current Meds  Medication Sig   ALPRAZolam (XANAX) 0.25 MG tablet Take 0.25 mg by mouth at bedtime.    amiodarone (PACERONE) 200 MG tablet Take 0.5 tablets (100 mg total) by mouth 2 (two) times daily.   atorvastatin (LIPITOR) 40 MG tablet Take 1 tablet (40 mg total) by mouth daily.   ezetimibe (ZETIA) 10 MG tablet Take 1 tablet by mouth once daily   FEROSUL 325 (65 Fe) MG tablet Take 325 mg by mouth 2 (two) times daily.   JARDIANCE 25 MG TABS tablet Take 25 mg by mouth daily.   levothyroxine (SYNTHROID) 88 MCG tablet Take 88 mcg by mouth daily before breakfast.   metFORMIN (GLUCOPHAGE) 850 MG tablet Take 850 mg by mouth 3 (three) times daily.   metoprolol succinate (TOPROL-XL) 25 MG 24 hr tablet Take 1 tablet (25 mg total) by mouth daily.   nitroGLYCERIN (NITROSTAT) 0.4 MG SL tablet Place 0.4 mg under the tongue every 5 (five) minutes  as needed for chest pain.   potassium chloride SA (KLOR-CON M) 20 MEQ tablet Take 1 tablet (20 mEq total) by mouth daily.   raloxifene (EVISTA) 60 MG tablet Take 1 tablet by mouth once daily   rivaroxaban (XARELTO) 20 MG TABS tablet Take 1 tablet (20 mg total) by mouth daily with supper.   torsemide (DEMADEX) 20 MG tablet Take 20 mg by mouth 2 (two) times daily.   traMADol (ULTRAM) 50 MG tablet Take 50 mg by mouth 2 (two) times daily as needed for moderate pain.     Allergies:   Patient has no active allergies.   Social History   Socioeconomic History   Marital status: Married    Spouse name: Not on file   Number of children: Not on file   Years of education: Not on file   Highest education level: Not on file  Occupational History   Not on file  Tobacco Use   Smoking status: Never    Passive exposure: Yes   Smokeless tobacco: Never   Tobacco comments:    "whole family" smoked in the house  Vaping Use   Vaping status: Never Used  Substance and Sexual Activity   Alcohol use: No  Drug use: No   Sexual activity: Not on file  Other Topics Concern   Not on file  Social History Narrative   Not on file   Social Drivers of Health   Financial Resource Strain: Not on file  Food Insecurity: No Food Insecurity (01/30/2023)   Hunger Vital Sign    Worried About Running Out of Food in the Last Year: Never true    Ran Out of Food in the Last Year: Never true  Transportation Needs: Patient Declined (01/30/2023)   PRAPARE - Administrator, Civil Service (Medical): Patient declined    Lack of Transportation (Non-Medical): Patient declined  Physical Activity: Not on file  Stress: Not on file  Social Connections: Not on file     Family History: The patient's family history includes Breast cancer in her paternal aunt; Diabetes in her mother; Stroke in her father and paternal aunt.  ROS:   Please see the history of present illness.    All other systems reviewed and are  negative.  EKGs/Labs/Other Studies Reviewed:    The following studies were reviewed today: .Marland KitchenEKG Interpretation Date/Time:  Thursday May 06 2023 10:50:04 EST Ventricular Rate:  62 PR Interval:  244 QRS Duration:  112 QT Interval:  504 QTC Calculation: 511 R Axis:   109  Text Interpretation: AV dual-paced rhythm with prolonged AV conduction with frequent ventricular-paced complexes Abnormal ECG When compared with ECG of 07-Apr-2023 08:47, Vent. rate has increased BY   2 BPM Confirmed by Belva Crome [phone]) on 05/06/2023 10:00:13 AM     Recent Labs: 01/30/2023: Magnesium 2.1 03/22/2023: TSH 3.440 04/08/2023: ALT 12; BUN 14; Creatinine, Ser 0.96; Hemoglobin 10.9; NT-Pro BNP 397; Platelets 167; Potassium 4.0; Sodium 146  Recent Lipid Panel    Component Value Date/Time   CHOL 164 01/12/2022 0904   TRIG 69 01/12/2022 0904   HDL 56 01/12/2022 0904   CHOLHDL 2.9 01/12/2022 0904   LDLCALC 95 01/12/2022 0904   LDLDIRECT 107 (H) 03/22/2023 0849    Physical Exam:    VS:  BP 113/68 (BP Location: Right Arm, Patient Position: Sitting, Cuff Size: Large)   Pulse 62   Ht 5\' 1"  (1.549 m)   Wt 206 lb (93.4 kg)   SpO2 99%   BMI 38.92 kg/m     Wt Readings from Last 3 Encounters:  05/06/23 206 lb (93.4 kg)  04/08/23 201 lb (91.2 kg)  04/07/23 201 lb 9.6 oz (91.4 kg)     GEN: Patient is in no acute distress HEENT: Normal NECK: No JVD; No carotid bruits LYMPHATICS: No lymphadenopathy CARDIAC: Hear sounds regular, 2/6 systolic murmur at the apex. RESPIRATORY:  Clear to auscultation without rales, wheezing or rhonchi  ABDOMEN: Soft, non-tender, non-distended MUSCULOSKELETAL:  No edema; No deformity  SKIN: Warm and dry NEUROLOGIC:  Alert and oriented x 3 PSYCHIATRIC:  Normal affect   Signed, Garwin Brothers, MD  05/06/2023 10:00 AM    [facility]

## 2023-05-06 NOTE — Patient Instructions (Addendum)
Medication Instructions:  Your physician has recommended you make the following change in your medication:   Decrease your Metoprolol to 12.5 mg daily  Decrease your Torsemide to 20 mg twice daily  *If you need a refill on your cardiac medications before your next appointment, please call your pharmacy*   Lab Work: Your physician recommends that you have a CMP and Magnesium today in the office.  If you have labs (blood work) drawn today and your tests are completely normal, you will receive your results only by: MyChart Message (if you have MyChart) OR A paper copy in the mail If you have any lab test that is abnormal or we need to change your treatment, we will call you to review the results.   Testing/Procedures: None ordered   Follow-Up: At [facility], you and your health needs are our priority.  As part of our continuing mission to provide you with exceptional heart care, we have created designated Provider Care Teams.  These Care Teams include your primary Cardiologist (physician) and Advanced Practice Providers (APPs -  Physician Assistants and Nurse Practitioners) who all work together to provide you with the care you need, when you need it.  We recommend signing up for the patient portal called "MyChart".  Sign up information is provided on this After Visit Summary.  MyChart is used to connect with patients for Virtual Visits (Telemedicine).  Patients are able to view lab/test results, encounter notes, upcoming appointments, etc.  Non-urgent messages can be sent to your provider as well.   To learn more about what you can do with MyChart, go to [email or web address].    Your next appointment:   3 month(s)  The format for your next appointment:   In Person  Provider:   Belva Crome, MD    Other Instructions none  Important Information About Sugar

## 2023-05-07 LAB — COMPREHENSIVE METABOLIC PANEL
ALT: 10 [IU]/L (ref 0–32)
AST: 17 [IU]/L (ref 0–40)
Albumin: 3.7 g/dL — ABNORMAL LOW (ref 3.9–4.9)
Alkaline Phosphatase: 109 [IU]/L (ref 44–121)
BUN/Creatinine Ratio: 18 (ref 12–28)
BUN: 15 mg/dL (ref 8–27)
Bilirubin Total: 0.4 mg/dL (ref 0.0–1.2)
CO2: 24 mmol/L (ref 20–29)
Calcium: 8.7 mg/dL (ref 8.7–10.3)
Chloride: 102 mmol/L (ref 96–106)
Creatinine, Ser: 0.85 mg/dL (ref 0.57–1.00)
Globulin, Total: 2.9 g/dL (ref 1.5–4.5)
Glucose: 103 mg/dL — ABNORMAL HIGH (ref 70–99)
Potassium: 3.6 mmol/L (ref 3.5–5.2)
Sodium: 142 mmol/L (ref 134–144)
Total Protein: 6.6 g/dL (ref 6.0–8.5)
eGFR: 74 mL/min/{1.73_m2} (ref >59)

## 2023-05-07 LAB — MAGNESIUM: Magnesium: 1.9 mg/dL (ref 1.6–2.3)

## 2023-05-17 ENCOUNTER — Telehealth: Payer: Self-pay

## 2023-05-17 NOTE — Telephone Encounter (Signed)
No VM to leave message

## 2023-05-17 NOTE — Telephone Encounter (Signed)
-----   Message from Aundra Dubin Revankar sent at 05/15/2023  2:22 PM EST ----- The results of the study is unremarkable. Please inform patient. I will discuss in detail at next appointment. Cc  primary care/referring physician Garwin Brothers, MD 05/15/2023 2:22 PM

## 2023-05-24 ENCOUNTER — Ambulatory Visit: Payer: PPO | Admitting: Cardiology

## 2023-06-02 NOTE — Progress Notes (Signed)
  Electrophysiology Office Note:   Date:  06/07/2023  ID:  Crystal Ramos, DOB 1955-12-12, MRN [id number]  Primary Cardiologist: Crystal Brothers, MD Primary Heart Failure: None Electrophysiologist: Crystal Daddona [name], MD      History of Present Illness:   Crystal Ramos is a 68 y.o. female with h/o diastolic heart failure, atrial fibrillation, aortic stenosis post TAVR, OSA, hypertension, diabetes, intermittent complete heart block seen today for routine electrophysiology followup.   Since last being seen in our clinic the patient reports increasing fatigue, weakness, shortness of breath, dizziness.  She is in atrial flutter.  Her device is not recording this as it is following into blanking period she has been feeling this way for a few weeks.  She has no chest pain.  She finds it difficult to do some of her daily activities.  she denies chest pain, PND, orthopnea, nausea, vomiting, dizziness, syncope, edema, weight gain, or early satiety.   Review of systems complete and found to be negative unless listed in HPI.      EP Information / Studies Reviewed:    EKG is ordered today. Personal review as below.   Atrial flutter, intermittent ventricular paced  PPM Interrogation-  reviewed in detail today,  See PACEART report.  Device History: Abbott Dual Chamber PPM implanted 01/31/2023 for CHB  Risk Assessment/Calculations:    CHA2DS2-VASc Score = 6   This indicates a 9.7% annual risk of stroke. The patient's score is based upon: CHF History: 1 HTN History: 1 Diabetes History: 1 Stroke History: 0 Vascular Disease History: 1 Age Score: 1 Gender Score: 1             Physical Exam:   VS:  BP 102/68   Pulse 88   Ht 5\' 1"  (1.549 m)   Wt 216 lb (98 kg)   SpO2 96%   BMI 40.81 kg/m    Wt Readings from Last 3 Encounters:  06/07/23 216 lb (98 kg)  05/06/23 206 lb (93.4 kg)  04/08/23 201 lb (91.2 kg)     GEN: Well nourished, well developed in no acute  distress NECK: No JVD; No carotid bruits CARDIAC: Irregularly irregular rate and rhythm, no murmurs, rubs, gallops RESPIRATORY:  Clear to auscultation without rales, wheezing or rhonchi  ABDOMEN: Soft, non-tender, non-distended EXTREMITIES:  No edema; No deformity   ASSESSMENT AND PLAN:    CHB s/p Abbott PPM  Normal PPM function See Pace Art report No changes today  2.  Aortic stenosis: Post TAVR.  Plan per primary cardiology  3.  Persistent atrial fibrillation/flutter: Continue amiodarone.  She presented to atrial flutter today.  Attempt was made to pace her into sinus rhythm, but she converted to atrial fibrillation.  She would benefit from rhythm control.  Crystal Ramos plan for cardioversion.  Crystal Ramos also plan for ablation.  Risks and benefits have been discussed.  She understands the risks and has agreed to the procedure.  4.  Secondary hypercoagulable state: Currently on Xarelto for atrial fibrillation  5.  Chronic diastolic heart failure: No obvious volume overload.  Plan per primary cardiology  Disposition:   Follow up with Dr. Elberta Ramos as usual post procedure  Signed, Crystal Branscum [name], MD

## 2023-06-02 NOTE — H&P (View-Only) (Signed)
  Electrophysiology Office Note:   Date:  06/07/2023  ID:  Crystal Ramos, DOB 1955-12-12, MRN [id number]  Primary Cardiologist: Crystal Brothers, MD Primary Heart Failure: None Electrophysiologist: Crystal Daddona Crystal Loa, MD      History of Present Illness:   Crystal Ramos is a 68 y.o. female with h/o diastolic heart failure, atrial fibrillation, aortic stenosis post TAVR, OSA, hypertension, diabetes, intermittent complete heart block seen today for routine electrophysiology followup.   Since last being seen in our clinic the patient reports increasing fatigue, weakness, shortness of breath, dizziness.  She is in atrial flutter.  Her device is not recording this as it is following into blanking period she has been feeling this way for a few weeks.  She has no chest pain.  She finds it difficult to do some of her daily activities.  she denies chest pain, PND, orthopnea, nausea, vomiting, dizziness, syncope, edema, weight gain, or early satiety.   Review of systems complete and found to be negative unless listed in HPI.      EP Information / Studies Reviewed:    EKG is ordered today. Personal review as below.   Atrial flutter, intermittent ventricular paced  PPM Interrogation-  reviewed in detail today,  See PACEART report.  Device History: Abbott Dual Chamber PPM implanted 01/31/2023 for CHB  Risk Assessment/Calculations:    CHA2DS2-VASc Score = 6   This indicates a 9.7% annual risk of stroke. The patient's score is based upon: CHF History: 1 HTN History: 1 Diabetes History: 1 Stroke History: 0 Vascular Disease History: 1 Age Score: 1 Gender Score: 1             Physical Exam:   VS:  BP 102/68   Pulse 88   Ht 5\' 1"  (1.549 m)   Wt 216 lb (98 kg)   SpO2 96%   BMI 40.81 kg/m    Wt Readings from Last 3 Encounters:  06/07/23 216 lb (98 kg)  05/06/23 206 lb (93.4 kg)  04/08/23 201 lb (91.2 kg)     GEN: Well nourished, well developed in no acute  distress NECK: No JVD; No carotid bruits CARDIAC: Irregularly irregular rate and rhythm, no murmurs, rubs, gallops RESPIRATORY:  Clear to auscultation without rales, wheezing or rhonchi  ABDOMEN: Soft, non-tender, non-distended EXTREMITIES:  No edema; No deformity   ASSESSMENT AND PLAN:    CHB s/p Abbott PPM  Normal PPM function See Pace Art report No changes today  2.  Aortic stenosis: Post TAVR.  Plan per primary cardiology  3.  Persistent atrial fibrillation/flutter: Continue amiodarone.  She presented to atrial flutter today.  Attempt was made to pace her into sinus rhythm, but she converted to atrial fibrillation.  She would benefit from rhythm control.  Crystal Ramos plan for cardioversion.  Crystal Ramos also plan for ablation.  Risks and benefits have been discussed.  She understands the risks and has agreed to the procedure.  4.  Secondary hypercoagulable state: Currently on Xarelto for atrial fibrillation  5.  Chronic diastolic heart failure: No obvious volume overload.  Plan per primary cardiology  Disposition:   Follow up with Dr. Elberta Ramos as usual post procedure  Signed, Crystal Ramos Crystal Loa, MD

## 2023-06-07 ENCOUNTER — Ambulatory Visit: Payer: PPO | Attending: Cardiology | Admitting: Cardiology

## 2023-06-07 ENCOUNTER — Encounter: Payer: Self-pay | Admitting: *Deleted

## 2023-06-07 ENCOUNTER — Encounter: Payer: Self-pay | Admitting: Cardiology

## 2023-06-07 VITALS — BP 102/68 | HR 88 | Ht 61.0 in | Wt 216.0 lb

## 2023-06-07 DIAGNOSIS — D6869 Other thrombophilia: Secondary | ICD-10-CM | POA: Diagnosis not present

## 2023-06-07 DIAGNOSIS — Z950 Presence of cardiac pacemaker: Secondary | ICD-10-CM

## 2023-06-07 DIAGNOSIS — I442 Atrioventricular block, complete: Secondary | ICD-10-CM | POA: Diagnosis not present

## 2023-06-07 DIAGNOSIS — I480 Paroxysmal atrial fibrillation: Secondary | ICD-10-CM | POA: Diagnosis not present

## 2023-06-07 DIAGNOSIS — Z01812 Encounter for preprocedural laboratory examination: Secondary | ICD-10-CM | POA: Diagnosis not present

## 2023-06-07 LAB — CUP PACEART INCLINIC DEVICE CHECK
Brady Statistic RA Percent Paced: 34 %
Brady Statistic RV Percent Paced: 74 %
Date Time Interrogation Session: 20250120101259
Implantable Lead Connection Status: 753985
Implantable Lead Connection Status: 753985
Implantable Lead Implant Date: 20240915
Implantable Lead Implant Date: 20240915
Implantable Lead Location: 753859
Implantable Lead Location: 753860
Implantable Lead Model: 3830
Implantable Pulse Generator Implant Date: 20240915
Lead Channel Impedance Value: 410 Ohm
Lead Channel Impedance Value: 600 Ohm
Lead Channel Pacing Threshold Amplitude: 0.75 V
Lead Channel Pacing Threshold Pulse Width: 0.5 ms
Lead Channel Sensing Intrinsic Amplitude: 1.4 mV
Lead Channel Sensing Intrinsic Amplitude: 9.3 mV
Pulse Gen Model: 2272
Pulse Gen Serial Number: 8213048

## 2023-06-07 NOTE — Progress Notes (Signed)
Remote pacemaker transmission.   

## 2023-06-07 NOTE — Patient Instructions (Addendum)
Medication Instructions:  Your physician recommends that you continue on your current medications as directed. Please refer to the Current Medication list given to you today.  *If you need a refill on your cardiac medications before your next appointment, please call your pharmacy*   Lab Work: Pre Cardioversion labs today: BMET & CBC  Pre procedure labs -- we will call you to schedule:  BMP & CBC  If you have a lab test that is abnormal and we need to change your treatment, we will call you to review the results -- otherwise no news is good news.    Testing/Procedures: Your physician has recommended that you have a Cardioversion (DCCV). Electrical Cardioversion uses a jolt of electricity to your heart either through paddles or wired patches attached to your chest. This is a controlled, usually prescheduled, procedure. Defibrillation is done under light anesthesia in the hospital, and you usually go home the day of the procedure. This is done to get your heart back into a normal rhythm. You are not awake for the procedure. Please see the instruction sheet given to you today.   Your physician has requested that you have cardiac CT 1 month PRIOR to your ablation. Cardiac computed tomography (CT) is a painless test that uses an x-ray machine to take clear, detailed pictures of your heart.  We will call you to schedule.  Your physician has recommended that you have an ablation. Catheter ablation is a medical procedure used to treat some cardiac arrhythmias (irregular heartbeats). During catheter ablation, a long, thin, flexible tube is put into a blood vessel in your groin (upper thigh), or neck. This tube is called an ablation catheter. It is then guided to your heart through the blood vessel. Radio frequency waves destroy small areas of heart tissue where abnormal heartbeats may cause an arrhythmia to start.   Your ablation is scheduled for 09/16/2023. Please arrive at [facility] at 5:30  am am.  We will call you for further instructions.   Follow-Up: At [facility], you and your health needs are our priority.  As part of our continuing mission to provide you with exceptional heart care, we have created designated Provider Care Teams.  These Care Teams include your primary Cardiologist (physician) and Advanced Practice Providers (APPs -  Physician Assistants and Nurse Practitioners) who all work together to provide you with the care you need, when you need it.  Your next appointment:   1 month(s) after your ablation  The format for your next appointment:   In Person  Provider:   AFib clinic   Thank you for choosing [facility]!!   Dory Horn, RN [phone]    Other Instructions   Cardiac Ablation Cardiac ablation is a procedure to destroy (ablate) some heart tissue that is sending bad signals. These bad signals cause problems in heart rhythm. The heart has many areas that make these signals. If there are problems in these areas, they can make the heart beat in a way that is not normal. Destroying some tissues can help make the heart rhythm normal. Tell your doctor about: Any allergies you have. All medicines you are taking. These include vitamins, herbs, eye drops, creams, and over-the-counter medicines. Any problems you or family members have had with medicines that make you fall asleep (anesthetics). Any blood disorders you have. Any surgeries you have had. Any medical conditions you have, such as kidney failure. Whether you are pregnant or may be pregnant. What are the risks?  This is a safe procedure. But problems may occur, including: Infection. Bruising and bleeding. Bleeding into the chest. Stroke or blood clots. Damage to nearby areas of your body. Allergies to medicines or dyes. The need for a pacemaker if the normal system is damaged. Failure of the procedure to treat the problem. What happens before the procedure? Medicines Ask  your doctor about: Changing or stopping your normal medicines. This is important. Taking aspirin and ibuprofen. Do not take these medicines unless your doctor tells you to take them. Taking other medicines, vitamins, herbs, and supplements. General instructions Follow instructions from your doctor about what you cannot eat or drink. Plan to have someone take you home from the hospital or clinic. If you will be going home right after the procedure, plan to have someone with you for 24 hours. Ask your doctor what steps will be taken to prevent infection. What happens during the procedure?  An IV tube will be put into one of your veins. You will be given a medicine to help you relax. The skin on your neck or groin will be numbed. A cut (incision) will be made in your neck or groin. A needle will be put through your cut and into a large vein. A tube (catheter) will be put into the needle. The tube will be moved to your heart. Dye may be put through the tube. This helps your doctor see your heart. Small devices (electrodes) on the tube will send out signals. A type of energy will be used to destroy some heart tissue. The tube will be taken out. Pressure will be held on your cut. This helps stop bleeding. A bandage will be put over your cut. The exact procedure may vary among doctors and hospitals. What happens after the procedure? You will be watched until you leave the hospital or clinic. This includes checking your heart rate, breathing rate, oxygen, and blood pressure. Your cut will be watched for bleeding. You will need to lie still for a few hours. Do not drive for 24 hours or as long as your doctor tells you. Summary Cardiac ablation is a procedure to destroy some heart tissue. This is done to treat heart rhythm problems. Tell your doctor about any medical conditions you may have. Tell him or her about all medicines you are taking to treat them. This is a safe procedure. But problems  may occur. These include infection, bruising, bleeding, and damage to nearby areas of your body. Follow what your doctor tells you about food and drink. You may also be told to change or stop some of your medicines. After the procedure, do not drive for 24 hours or as long as your doctor tells you. This information is not intended to replace advice given to you by your health care provider. Make sure you discuss any questions you have with your health care provider. Document Revised: 07/25/2021 Document Reviewed: 04/06/2019 Elsevier Patient Education  2023 [employer]   Cardiac Ablation, Care After  This sheet gives you information about how to care for yourself after your procedure. Your health care provider may also give you more specific instructions. If you have problems or questions, contact your health care provider. What can I expect after the procedure? After the procedure, it is common to have: Bruising around your puncture site. Tenderness around your puncture site. Skipped heartbeats. If you had an atrial fibrillation ablation, you may have atrial fibrillation during the first several months after your procedure.  Tiredness (fatigue).  Follow these instructions at home: Puncture site care  Follow instructions from your health care provider about how to take care of your puncture site. Make sure you: If present, leave stitches (sutures), skin glue, or adhesive strips in place. These skin closures may need to stay in place for up to 2 weeks. If adhesive strip edges start to loosen and curl up, you may trim the loose edges. Do not remove adhesive strips completely unless your health care provider tells you to do that. If a large square bandage is present, this may be removed 24 hours after surgery.  Check your puncture site every day for signs of infection. Check for: Redness, swelling, or pain. Fluid or blood. If your puncture site starts to bleed, lie down on your back, apply  firm pressure to the area, and contact your health care provider. Warmth. Pus or a bad smell. A pea or small marble sized lump at the site is normal and can take up to three months to resolve.  Driving Do not drive for at least 4 days after your procedure or however long your health care provider recommends. (Do not resume driving if you have previously been instructed not to drive for other health reasons.) Do not drive or use heavy machinery while taking prescription pain medicine. Activity Avoid activities that take a lot of effort for at least 7 days after your procedure. Do not lift anything that is heavier than 5 lb (4.5 kg) for one week.  No sexual activity for 1 week.  Return to your normal activities as told by your health care provider. Ask your health care provider what activities are safe for you. General instructions Take over-the-counter and prescription medicines only as told by your health care provider. Do not use any products that contain nicotine or tobacco, such as cigarettes and e-cigarettes. If you need help quitting, ask your health care provider. You may shower after 24 hours, but Do not take baths, swim, or use a hot tub for 1 week.  Do not drink alcohol for 24 hours after your procedure. Keep all follow-up visits as told by your health care provider. This is important. Contact a health care provider if: You have redness, mild swelling, or pain around your puncture site. You have fluid or blood coming from your puncture site that stops after applying firm pressure to the area. Your puncture site feels warm to the touch. You have pus or a bad smell coming from your puncture site. You have a fever. You have chest pain or discomfort that spreads to your neck, jaw, or arm. You have chest pain that is worse with lying on your back or taking a deep breath. You are sweating a lot. You feel nauseous. You have a fast or irregular heartbeat. You have shortness of  breath. You are dizzy or light-headed and feel the need to lie down. You have pain or numbness in the arm or leg closest to your puncture site. Get help right away if: Your puncture site suddenly swells. Your puncture site is bleeding and the bleeding does not stop after applying firm pressure to the area. These symptoms may represent a serious problem that is an emergency. Do not wait to see if the symptoms will go away. Get medical help right away. Call your local emergency services (911 in the [place] Do not drive yourself to the hospital. Summary After the procedure, it is normal to have bruising and tenderness at the puncture site in your groin,  neck, or forearm. Check your puncture site every day for signs of infection. Get help right away if your puncture site is bleeding and the bleeding does not stop after applying firm pressure to the area. This is a medical emergency. This information is not intended to replace advice given to you by your health care provider. Make sure you discuss any questions you have with your health care provider.

## 2023-06-08 LAB — BASIC METABOLIC PANEL
BUN/Creatinine Ratio: 12 (ref 12–28)
BUN: 11 mg/dL (ref 8–27)
CO2: 24 mmol/L (ref 20–29)
Calcium: 8.8 mg/dL (ref 8.7–10.3)
Chloride: 103 mmol/L (ref 96–106)
Creatinine, Ser: 0.89 mg/dL (ref 0.57–1.00)
Glucose: 105 mg/dL — ABNORMAL HIGH (ref 70–99)
Potassium: 4.1 mmol/L (ref 3.5–5.2)
Sodium: 144 mmol/L (ref 134–144)
eGFR: 70 mL/min/{1.73_m2} (ref >59)

## 2023-06-08 LAB — CBC
Hematocrit: 36.5 % (ref 34.0–46.6)
Hemoglobin: 10.7 g/dL — ABNORMAL LOW (ref 11.1–15.9)
MCH: 23.6 pg — ABNORMAL LOW (ref 26.6–33.0)
MCHC: 29.3 g/dL — ABNORMAL LOW (ref 31.5–35.7)
MCV: 80 fL (ref 79–97)
Platelets: 132 10*3/uL — ABNORMAL LOW (ref 150–450)
RBC: 4.54 x10E6/uL (ref 3.77–5.28)
RDW: 20.5 % — ABNORMAL HIGH (ref 11.7–15.4)
WBC: 5.6 10*3/uL (ref 3.4–10.8)

## 2023-06-09 ENCOUNTER — Other Ambulatory Visit: Payer: Self-pay | Admitting: Cardiology

## 2023-06-09 ENCOUNTER — Other Ambulatory Visit: Payer: Self-pay | Admitting: Oncology

## 2023-06-09 DIAGNOSIS — D0511 Intraductal carcinoma in situ of right breast: Secondary | ICD-10-CM

## 2023-06-09 NOTE — Telephone Encounter (Signed)
Rx refill sent to pharmacy. 

## 2023-06-10 ENCOUNTER — Ambulatory Visit (HOSPITAL_COMMUNITY): Payer: Self-pay

## 2023-06-10 ENCOUNTER — Encounter (HOSPITAL_COMMUNITY): Payer: Self-pay | Admitting: Cardiovascular Disease

## 2023-06-10 ENCOUNTER — Encounter (HOSPITAL_COMMUNITY)
Admission: RE | Disposition: A | Discharge status: Home / Self Care | Payer: Self-pay | Source: Home / Self Care | Attending: Cardiovascular Disease

## 2023-06-10 ENCOUNTER — Ambulatory Visit (HOSPITAL_COMMUNITY)
Admission: RE | Admit: 2023-06-10 | Discharge: 2023-06-10 | Disposition: A | Discharge status: Home / Self Care | Payer: PPO | Attending: Cardiovascular Disease | Admitting: Cardiovascular Disease

## 2023-06-10 ENCOUNTER — Other Ambulatory Visit: Payer: Self-pay

## 2023-06-10 DIAGNOSIS — I442 Atrioventricular block, complete: Secondary | ICD-10-CM | POA: Diagnosis not present

## 2023-06-10 DIAGNOSIS — Z79899 Other long term (current) drug therapy: Secondary | ICD-10-CM | POA: Insufficient documentation

## 2023-06-10 DIAGNOSIS — I4892 Unspecified atrial flutter: Secondary | ICD-10-CM | POA: Insufficient documentation

## 2023-06-10 DIAGNOSIS — E785 Hyperlipidemia, unspecified: Secondary | ICD-10-CM | POA: Diagnosis not present

## 2023-06-10 DIAGNOSIS — I4819 Other persistent atrial fibrillation: Secondary | ICD-10-CM | POA: Diagnosis not present

## 2023-06-10 DIAGNOSIS — I110 Hypertensive heart disease with heart failure: Secondary | ICD-10-CM | POA: Insufficient documentation

## 2023-06-10 DIAGNOSIS — Z950 Presence of cardiac pacemaker: Secondary | ICD-10-CM | POA: Diagnosis not present

## 2023-06-10 DIAGNOSIS — E119 Type 2 diabetes mellitus without complications: Secondary | ICD-10-CM | POA: Diagnosis not present

## 2023-06-10 DIAGNOSIS — I350 Nonrheumatic aortic (valve) stenosis: Secondary | ICD-10-CM | POA: Diagnosis not present

## 2023-06-10 DIAGNOSIS — G4733 Obstructive sleep apnea (adult) (pediatric): Secondary | ICD-10-CM | POA: Diagnosis not present

## 2023-06-10 DIAGNOSIS — I5032 Chronic diastolic (congestive) heart failure: Secondary | ICD-10-CM | POA: Diagnosis not present

## 2023-06-10 DIAGNOSIS — I2510 Atherosclerotic heart disease of native coronary artery without angina pectoris: Secondary | ICD-10-CM | POA: Diagnosis not present

## 2023-06-10 DIAGNOSIS — I480 Paroxysmal atrial fibrillation: Secondary | ICD-10-CM

## 2023-06-10 DIAGNOSIS — E66813 Obesity, class 3: Secondary | ICD-10-CM | POA: Insufficient documentation

## 2023-06-10 DIAGNOSIS — Z7901 Long term (current) use of anticoagulants: Secondary | ICD-10-CM | POA: Diagnosis not present

## 2023-06-10 DIAGNOSIS — I4891 Unspecified atrial fibrillation: Secondary | ICD-10-CM

## 2023-06-10 DIAGNOSIS — Z6841 Body Mass Index (BMI) 40.0 and over, adult: Secondary | ICD-10-CM | POA: Diagnosis not present

## 2023-06-10 DIAGNOSIS — Z952 Presence of prosthetic heart valve: Secondary | ICD-10-CM | POA: Insufficient documentation

## 2023-06-10 DIAGNOSIS — E039 Hypothyroidism, unspecified: Secondary | ICD-10-CM | POA: Diagnosis not present

## 2023-06-10 DIAGNOSIS — D6869 Other thrombophilia: Secondary | ICD-10-CM | POA: Diagnosis not present

## 2023-06-10 DIAGNOSIS — I10 Essential (primary) hypertension: Secondary | ICD-10-CM | POA: Diagnosis not present

## 2023-06-10 HISTORY — PX: CARDIOVERSION: EP1203

## 2023-06-10 LAB — GLUCOSE, CAPILLARY: Glucose-Capillary: 99 mg/dL (ref 70–99)

## 2023-06-10 SURGERY — CARDIOVERSION (CATH LAB)
Anesthesia: Monitor Anesthesia Care

## 2023-06-10 MED ORDER — LIDOCAINE 2% (20 MG/ML) 5 ML SYRINGE
INTRAMUSCULAR | Status: DC | PRN
  Administered 2023-06-10: 50 mg via INTRAVENOUS

## 2023-06-10 MED ORDER — PROPOFOL 10 MG/ML IV BOLUS
INTRAVENOUS | Status: DC | PRN
  Administered 2023-06-10: 50 mg via INTRAVENOUS

## 2023-06-10 MED ORDER — SODIUM CHLORIDE 0.9 % IV SOLN: INTRAVENOUS | Status: DC

## 2023-06-10 SURGICAL SUPPLY — 1 items: PAD DEFIB RADIO PHYSIO CONN (PAD) ×1 IMPLANT

## 2023-06-10 NOTE — CV Procedure (Signed)
Electrical Cardioversion Procedure Note Jaili Vancleef [id number] 1955-07-02  Procedure: Electrical Cardioversion Indications:  Atrial Fibrillation  Procedure Details Consent: Risks of procedure as well as the alternatives and risks of each were explained to the (patient/caregiver).  Consent for procedure obtained. Time Out: Verified patient identification, verified procedure, site/side was marked, verified correct patient position, special equipment/implants available, medications/allergies/relevent history reviewed, required imaging and test results available.  Performed  Patient placed on cardiac monitor, pulse oximetry, supplemental oxygen as necessary.  Sedation given:  propofol Pacer pads placed anterior and posterior chest.  Cardioverted 1 time(s).  Cardioverted at 200J.  Evaluation Findings: Post procedure EKG shows:  ASVP Complications: None Patient did tolerate procedure well.   Chilton Si, MD 06/10/2023, 11:45 AM

## 2023-06-10 NOTE — Discharge Instructions (Signed)

## 2023-06-10 NOTE — Interval H&P Note (Signed)
History and Physical Interval Note:  06/10/2023 11:14 AM  Crystal Ramos  has presented today for surgery, with the diagnosis of AFIB, AFLUTTER.  The various methods of treatment have been discussed with the patient and family. After consideration of risks, benefits and other options for treatment, the patient has consented to  Procedure(s): CARDIOVERSION (N/A) as a surgical intervention.  The patient's history has been reviewed, patient examined, no change in status, stable for surgery.  I have reviewed the patient's chart and labs.  Questions were answered to the patient's satisfaction.     Geffrey Michaelsen,MD

## 2023-06-10 NOTE — Anesthesia Postprocedure Evaluation (Signed)
Anesthesia Post Note  Patient: Crystal Ramos  Procedure(s) Performed: CARDIOVERSION     Patient location during evaluation: PACU Anesthesia Type: General Level of consciousness: awake and alert Pain management: pain level controlled Vital Signs Assessment: post-procedure vital signs reviewed and stable Respiratory status: spontaneous breathing, nonlabored ventilation and respiratory function stable Cardiovascular status: stable and blood pressure returned to baseline Postop Assessment: no apparent nausea or vomiting Anesthetic complications: no   No notable events documented.  Last Vitals:  Vitals:   06/10/23 1036 06/10/23 1142  BP: (!) 127/110   Pulse: 99   Resp: 20   Temp: 37.1 C 36.7 C  SpO2: 96%     Last Pain:  Vitals:   06/10/23 1142  TempSrc: Temporal  PainSc: 0-No pain                 Aamirah Salmi A.

## 2023-06-10 NOTE — Anesthesia Preprocedure Evaluation (Addendum)
Anesthesia Evaluation  Patient identified by MRN, date of birth, ID band Patient awake    Reviewed: Allergy & Precautions, NPO status , Patient's Chart, lab work & pertinent test results, reviewed documented beta blocker date and time   Airway Mallampati: II  TM Distance: >3 FB Neck ROM: Full    Dental  (+) Edentulous Upper, Edentulous Lower, Dental Advisory Given   Pulmonary neg pulmonary ROS   Pulmonary exam normal breath sounds clear to auscultation       Cardiovascular hypertension, Pt. on medications pulmonary hypertension+ angina  + CAD  + dysrhythmias Atrial Fibrillation + Cardiac Defibrillator + Valvular Problems/Murmurs  Rhythm:Regular Rate:Normal  S/P TAVR  Echo 11/24 Severe pulmonary HTN    Cath 11/2022 1. Mild to moderate, non-obstructive coronary artery disease with up to 50% stenosis of the mid LAD. 2. Mildly to moderately elevated left heart filling pressures (LVEDP 20 mmHg, PCWP 32 mm).  Prominent v-waves on PCWP tracing and discrepancy in pressures between PCWP and LVEDP suggest underling mitral valve disease. 3. Severely elevated right heart and pulmonary artery pressures (RA 17 mmHg, mean PA 48 mmHg). 4. Normal Fick cardiac output/index. 5. Moderate to severe aortic valve stenosis (mean gradient 36 mmHg, valve area 0.8 cm^2).   Recommendations: 1. Escalate diuresis. 2. Restart rivaroxaban tomorrow if no evidence of bleeding/vascular injury at catheterization sites. 3. Consultation with structural heart and advanced heart failure teams should be considered.    Neuro/Psych negative neurological ROS  negative psych ROS   GI/Hepatic negative GI ROS, Neg liver ROS,,,  Endo/Other  diabetes, Well Controlled, Type 2, Oral Hypoglycemic AgentsHypothyroidism  Class 3 obesityHyperlipidemia  Renal/GU negative Renal ROS     Musculoskeletal negative musculoskeletal ROS (+)    Abdominal  (+) + obese   Peds  Hematology  (+) Blood dyscrasia, anemia Xarelto therapy- last dose yesterday pm Thrombocytopenia   Anesthesia Other Findings   Reproductive/Obstetrics                              Anesthesia Physical Anesthesia Plan  ASA: 3  Anesthesia Plan: MAC   Post-op Pain Management: Minimal or no pain anticipated   Induction: Intravenous  PONV Risk Score and Plan: 2 and Ondansetron, Dexamethasone, Treatment may vary due to age or medical condition, Propofol infusion and TIVA  Airway Management Planned: Natural Airway, Nasal Cannula and Mask  Additional Equipment: None  Intra-op Plan:   Post-operative Plan:   Informed Consent: I have reviewed the patients History and Physical, chart, labs and discussed the procedure including the risks, benefits and alternatives for the proposed anesthesia with the patient or authorized representative who has indicated his/her understanding and acceptance.     Dental advisory given  Plan Discussed with: CRNA and Anesthesiologist  Anesthesia Plan Comments:          Anesthesia Quick Evaluation

## 2023-06-10 NOTE — Transfer of Care (Addendum)
Immediate Anesthesia Transfer of Care Note  Patient: Crystal Ramos  Procedure(s) Performed: CARDIOVERSION  Patient Location: PACU and Cath Lab  Anesthesia Type:General  Level of Consciousness: awake and patient cooperative  Airway & Oxygen Therapy: Patient Spontanous Breathing and Patient connected to nasal cannula oxygen  Post-op Assessment: Report given to RN and Post -op Vital signs reviewed and stable  Post vital signs: Reviewed and stable  Last Vitals:  Vitals Value Taken Time  BP    Temp    Pulse    Resp    SpO2      Last Pain: There were no vitals filed for this visit.       Complications: No notable events documented.

## 2023-06-11 ENCOUNTER — Other Ambulatory Visit: Payer: Self-pay

## 2023-06-11 ENCOUNTER — Telehealth: Payer: Self-pay | Admitting: Cardiology

## 2023-06-11 ENCOUNTER — Encounter (HOSPITAL_COMMUNITY): Payer: Self-pay | Admitting: Cardiovascular Disease

## 2023-06-11 NOTE — Telephone Encounter (Signed)
Called patient and she reported that she had a cardioversion yesterday with Dr. Elberta Fortis. Today she has become very short of breath at rest but more with exertion. She is currently taking Torsemide but does not feel as though it is working as she has swelling in both of her ankles. She is not able to check her vitals because she does not have any equipment at home.

## 2023-06-11 NOTE — Telephone Encounter (Signed)
Pt c/o Shortness Of Breath: STAT if SOB developed within the last 24 hours or pt is noticeably SOB on the phone  1. Are you currently SOB (can you hear that pt is SOB on the phone)? Yes  2. How long have you been experiencing SOB? Yesterday evening   3. Are you SOB when sitting or when up moving around? Both but more moving around than sitting   4. Are you currently experiencing any other symptoms? No.

## 2023-06-11 NOTE — Telephone Encounter (Signed)
Called patient and informed her of Dr. Madireddy's recommendation below:  "With history of complete heart block s/p permanent pacemaker, s/p TAVR for aortic stenosis, chronic diastolic heart failure now s/p cardioversion with worsening shortness of breath, would be better off being evaluated in the ER to assess fluid status and IV diuresis and monitored. Please advise her to go to the ER."  Patient verbalized understanding and had no further questions at this time.

## 2023-06-13 DIAGNOSIS — N390 Urinary tract infection, site not specified: Secondary | ICD-10-CM | POA: Diagnosis not present

## 2023-06-13 DIAGNOSIS — R9431 Abnormal electrocardiogram [ECG] [EKG]: Secondary | ICD-10-CM | POA: Diagnosis not present

## 2023-06-13 DIAGNOSIS — Z20822 Contact with and (suspected) exposure to covid-19: Secondary | ICD-10-CM | POA: Diagnosis not present

## 2023-06-13 DIAGNOSIS — R0602 Shortness of breath: Secondary | ICD-10-CM | POA: Diagnosis not present

## 2023-06-13 DIAGNOSIS — I5032 Chronic diastolic (congestive) heart failure: Secondary | ICD-10-CM | POA: Diagnosis not present

## 2023-06-13 DIAGNOSIS — R079 Chest pain, unspecified: Secondary | ICD-10-CM | POA: Diagnosis not present

## 2023-06-13 DIAGNOSIS — I509 Heart failure, unspecified: Secondary | ICD-10-CM | POA: Diagnosis not present

## 2023-06-13 DIAGNOSIS — I4589 Other specified conduction disorders: Secondary | ICD-10-CM | POA: Diagnosis not present

## 2023-06-13 DIAGNOSIS — R609 Edema, unspecified: Secondary | ICD-10-CM | POA: Diagnosis not present

## 2023-06-19 DEATH — deceased

## 2023-08-11 ENCOUNTER — Ambulatory Visit: Payer: PPO | Admitting: Cardiology

## 2023-08-18 ENCOUNTER — Ambulatory Visit: Payer: PPO | Admitting: Cardiology

## 2023-08-26 ENCOUNTER — Other Ambulatory Visit (HOSPITAL_COMMUNITY): Payer: PPO

## 2023-09-16 ENCOUNTER — Ambulatory Visit (HOSPITAL_COMMUNITY): Admit: 2023-09-16 | Payer: PPO | Admitting: Cardiology

## 2023-09-16 SURGERY — ATRIAL FIBRILLATION ABLATION
Anesthesia: General

## 2023-09-30 ENCOUNTER — Ambulatory Visit: Payer: PPO | Admitting: Oncology

## 2024-01-28 ENCOUNTER — Ambulatory Visit: Payer: PPO

## 2024-01-28 ENCOUNTER — Other Ambulatory Visit (HOSPITAL_COMMUNITY): Payer: PPO
